# Patient Record
Sex: Female | Born: 1935 | Race: White | Hispanic: No | Smoking: Never smoker
Health system: Southern US, Community
[De-identification: ages and names within clinical notes are randomized; demographics above are authoritative.]

## PROBLEM LIST (undated history)

## (undated) DIAGNOSIS — N189 Chronic kidney disease, unspecified: Secondary | ICD-10-CM

## (undated) DIAGNOSIS — I10 Essential (primary) hypertension: Secondary | ICD-10-CM

## (undated) DIAGNOSIS — C801 Malignant (primary) neoplasm, unspecified: Secondary | ICD-10-CM

## (undated) DIAGNOSIS — G459 Transient cerebral ischemic attack, unspecified: Secondary | ICD-10-CM

## (undated) DIAGNOSIS — E213 Hyperparathyroidism, unspecified: Secondary | ICD-10-CM

## (undated) DIAGNOSIS — E119 Type 2 diabetes mellitus without complications: Secondary | ICD-10-CM

## (undated) HISTORY — PX: ABDOMINAL HYSTERECTOMY: SHX81

---

## 2004-09-21 ENCOUNTER — Emergency Department: Payer: Self-pay | Admitting: Emergency Medicine

## 2005-05-02 ENCOUNTER — Ambulatory Visit: Payer: Self-pay | Admitting: Internal Medicine

## 2005-05-19 ENCOUNTER — Ambulatory Visit: Payer: Self-pay | Admitting: Internal Medicine

## 2006-06-27 ENCOUNTER — Emergency Department: Payer: Self-pay | Admitting: Unknown Physician Specialty

## 2006-08-17 ENCOUNTER — Ambulatory Visit: Payer: Self-pay

## 2006-08-29 ENCOUNTER — Encounter: Payer: Self-pay | Admitting: General Practice

## 2006-09-08 ENCOUNTER — Encounter: Payer: Self-pay | Admitting: General Practice

## 2006-10-07 ENCOUNTER — Encounter: Payer: Self-pay | Admitting: General Practice

## 2006-11-13 ENCOUNTER — Ambulatory Visit: Payer: Self-pay | Admitting: Internal Medicine

## 2007-11-19 ENCOUNTER — Ambulatory Visit: Payer: Self-pay | Admitting: Internal Medicine

## 2010-02-04 ENCOUNTER — Ambulatory Visit: Payer: Self-pay | Admitting: Internal Medicine

## 2010-11-16 ENCOUNTER — Ambulatory Visit: Payer: Self-pay | Admitting: Unknown Physician Specialty

## 2010-11-22 LAB — PATHOLOGY REPORT

## 2011-03-08 ENCOUNTER — Ambulatory Visit: Payer: Self-pay | Admitting: Internal Medicine

## 2011-03-09 ENCOUNTER — Ambulatory Visit: Payer: Self-pay | Admitting: Internal Medicine

## 2011-04-09 ENCOUNTER — Ambulatory Visit: Payer: Self-pay | Admitting: Internal Medicine

## 2011-11-11 ENCOUNTER — Emergency Department: Payer: Self-pay | Admitting: Emergency Medicine

## 2011-11-29 ENCOUNTER — Ambulatory Visit: Payer: Self-pay | Admitting: Internal Medicine

## 2013-08-09 ENCOUNTER — Emergency Department: Payer: Self-pay | Admitting: Emergency Medicine

## 2013-08-09 LAB — COMPREHENSIVE METABOLIC PANEL
ALK PHOS: 90 U/L
ANION GAP: 6 — AB (ref 7–16)
AST: 32 U/L (ref 15–37)
Albumin: 3.7 g/dL (ref 3.4–5.0)
BUN: 19 mg/dL — ABNORMAL HIGH (ref 7–18)
Bilirubin,Total: 0.5 mg/dL (ref 0.2–1.0)
CALCIUM: 9.7 mg/dL (ref 8.5–10.1)
CO2: 26 mmol/L (ref 21–32)
CREATININE: 1.6 mg/dL — AB (ref 0.60–1.30)
Chloride: 105 mmol/L (ref 98–107)
GFR CALC AF AMER: 35 — AB
GFR CALC NON AF AMER: 31 — AB
Glucose: 224 mg/dL — ABNORMAL HIGH (ref 65–99)
OSMOLALITY: 283 (ref 275–301)
Potassium: 3.8 mmol/L (ref 3.5–5.1)
SGPT (ALT): 23 U/L (ref 12–78)
SODIUM: 137 mmol/L (ref 136–145)
Total Protein: 7.2 g/dL (ref 6.4–8.2)

## 2013-08-09 LAB — CBC
HCT: 44.4 % (ref 35.0–47.0)
HGB: 14.6 g/dL (ref 12.0–16.0)
MCH: 30.3 pg (ref 26.0–34.0)
MCHC: 32.8 g/dL (ref 32.0–36.0)
MCV: 92 fL (ref 80–100)
PLATELETS: 324 10*3/uL (ref 150–440)
RBC: 4.81 10*6/uL (ref 3.80–5.20)
RDW: 13.7 % (ref 11.5–14.5)
WBC: 19.4 10*3/uL — AB (ref 3.6–11.0)

## 2013-08-09 LAB — CK TOTAL AND CKMB (NOT AT ARMC)
CK, Total: 465 U/L — ABNORMAL HIGH (ref 21–215)
CK-MB: 8 ng/mL — AB (ref 0.5–3.6)

## 2013-08-09 LAB — TROPONIN I
Troponin-I: <0.02 ng/mL
Troponin-I: <0.02 ng/mL

## 2013-12-04 ENCOUNTER — Ambulatory Visit: Payer: Self-pay | Admitting: Internal Medicine

## 2014-01-10 ENCOUNTER — Ambulatory Visit: Payer: Self-pay | Admitting: Unknown Physician Specialty

## 2014-01-13 LAB — PATHOLOGY REPORT

## 2015-11-30 ENCOUNTER — Other Ambulatory Visit: Payer: Self-pay | Admitting: Internal Medicine

## 2015-11-30 DIAGNOSIS — Z1231 Encounter for screening mammogram for malignant neoplasm of breast: Secondary | ICD-10-CM

## 2015-12-08 ENCOUNTER — Ambulatory Visit: Payer: Medicare Other

## 2015-12-22 ENCOUNTER — Ambulatory Visit
Admission: RE | Admit: 2015-12-22 | Discharge: 2015-12-22 | Disposition: A | Discharge status: Home / Self Care | Payer: Medicare Other | Source: Ambulatory Visit | Attending: Internal Medicine | Admitting: Internal Medicine

## 2015-12-22 ENCOUNTER — Ambulatory Visit: Admission: RE | Admit: 2015-12-22 | Payer: Medicare Other | Source: Ambulatory Visit

## 2015-12-22 DIAGNOSIS — Z1231 Encounter for screening mammogram for malignant neoplasm of breast: Secondary | ICD-10-CM | POA: Insufficient documentation

## 2015-12-22 HISTORY — DX: Malignant (primary) neoplasm, unspecified: C80.1

## 2016-06-02 ENCOUNTER — Other Ambulatory Visit: Payer: Self-pay | Admitting: Internal Medicine

## 2016-06-02 DIAGNOSIS — R1310 Dysphagia, unspecified: Secondary | ICD-10-CM

## 2016-06-24 ENCOUNTER — Ambulatory Visit: Payer: Medicare Other | Attending: Internal Medicine

## 2016-07-04 ENCOUNTER — Ambulatory Visit
Admission: RE | Admit: 2016-07-04 | Discharge: 2016-07-04 | Disposition: A | Discharge status: Home / Self Care | Payer: Medicare Other | Source: Ambulatory Visit | Attending: Internal Medicine | Admitting: Internal Medicine

## 2016-07-04 DIAGNOSIS — R1310 Dysphagia, unspecified: Secondary | ICD-10-CM | POA: Diagnosis not present

## 2016-07-04 DIAGNOSIS — R1312 Dysphagia, oropharyngeal phase: Secondary | ICD-10-CM

## 2016-07-04 NOTE — Therapy (Signed)
[place], [place], [address] Phone: [phone]   Fax:     Modified Barium Swallow  Patient Details  Name: Amber Fields MRN: [id number] Date of Birth: 1933/01/25 No Data Recorded  Encounter Date: 07/04/2016      End of Session - 07/04/16 1418    Visit Number 1   Number of Visits 1   Date for SLP Re-Evaluation 07/04/16   SLP Start Time 59   SLP Stop Time  1315   SLP Time Calculation (min) 45 min   Activity Tolerance Patient tolerated treatment well      Past Medical History:  Diagnosis Date  . Cancer ([place])    skin ca    Past Surgical History:  Procedure Laterality Date  . ABDOMINAL HYSTERECTOMY      There were no vitals filed for this visit.   Subjective: Patient behavior: (alertness, ability to follow instructions, etc.): Patient is alert, able to express her swallowing history, and able to follow directions.  Chief complaint: Difficulty initiating swallowing "at times"   Objective:  Radiological Procedure: A videoflouroscopic evaluation of oral-preparatory, reflex initiation, and pharyngeal phases of the swallow was performed; as well as a screening of the upper esophageal phase.  I. POSTURE: Upright in MBS chair  II. VIEW: Lateral  III. COMPENSATORY STRATEGIES: N/A  IV. BOLUSES ADMINISTERED:   Thin Liquid: 2 small, 3 rapid consecutive   Nectar-thick Liquid: 1 moderate size    Puree: 2 teaspoon presentations   Mechanical Soft: 1/4 graham cracker in applesauce  V. RESULTS OF EVALUATION: A. ORAL PREPARATORY PHASE: (The lips, tongue, and velum are observed for strength and coordination)       **Overall Severity Rating: Within normal limits  B. SWALLOW INITIATION/REFLEX: (The reflex is normal if "triggered" by the time the bolus reached the base of the tongue)  **Overall Severity Rating: Within normal limits  C. PHARYNGEAL PHASE: (Pharyngeal function is  normal if the bolus shows rapid, smooth, and continuous transit through the pharynx and there is no pharyngeal residue after the swallow)  **Overall Severity Rating: Within functional limits  D. LARYNGEAL PENETRATION: (Material entering into the laryngeal inlet/vestibule but not aspirated) Flash laryngeal penetration with thin liquids  E. ASPIRATION: None  F. ESOPHAGEAL PHASE: (Screening of the upper esophagus): No abnormalities within the viewable cervical esophagus  ASSESSMENT: This 82 year old woman, with inconsistent difficulty initiating swallowing, is presenting with minimal oropharyngeal dyspahgia.  Oral control of the bolus including oral hold, rotary mastication, and anterior to posterior transfer are within normal limits. Timing of the pharyngeal swallow is within normal limits.  Aspects of the pharyngeal stage of swallowing including tongue base retraction, hyolaryngeal excursion, epiglottic inversion, duration/amplitude of UES opening, and laryngeal vestibule closure at the height of the swallow are within normal limits.  There is no vallecular residue.  There was transient laryngeal penetration (within normal limits for patient's age) and no observed aspiration.  It does not appear that her subjective complaint of difficulty initiating swallowing is clinically significant and the patient is not at risk for prandial aspiration.    PLAN/RECOMMENDATIONS:   A. Diet: Regular   B. Swallowing Precautions: Standard; if difficulty initiating swallowing occurs, just relax for a few minutes   C. Recommended consultation to N/A   D. Therapy recommendations N/A   E. Results and recommendations were discussed with the patient immediately following the study and the final report routed to the referring MD.  Oropharyngeal dysphagia - Plan: DG OP Swallowing Func-Medicare/Speech Path, DG OP Swallowing Func-Medicare/Speech Path      G-Codes - 2016/07/15 1420    Functional Assessment Tool  Used MBS, clinical judgment   Functional Limitations Swallowing   Swallow Current Status (Y7092) At least 1 percent but less than 20 percent impaired, limited or restricted   Swallow Goal Status (H5747) At least 1 percent but less than 20 percent impaired, limited or restricted   Swallow Discharge Status [phone]) At least 1 percent but less than 20 percent impaired, limited or restricted          Problem List There are no active problems to display for this patient.   Lou Miner 07/15/2016, 2:21 PM  [facility] [place], [place], [address] Phone: [phone]   Fax:     Name: Amber Fields MRN: [id number] Date of Birth: 12-01-1932

## 2017-11-10 IMAGING — RF DG SWALLOWING FUNCTION
7 series · 13 of 24 positions shown · non-contrast
Comparison: No recent prior.

EXAM:
MODIFIED BARIUM SWALLOW
TECHNIQUE: Different consistencies of barium were administered orally to the
patient by the Speech Pathologist. Imaging of the pharynx was
performed in the lateral projection.

FLUOROSCOPY TIME:  Fluoroscopy Time:  1 minutes 6 seconds.
Radiation Exposure Index (if provided by the fluoroscopic device):
5.0 mGy
Number of Acquired Spot Images: Cine images obtained .

[Series 3: run · 2 of 265 frames shown (1 of 7)]
[frame 40/265]
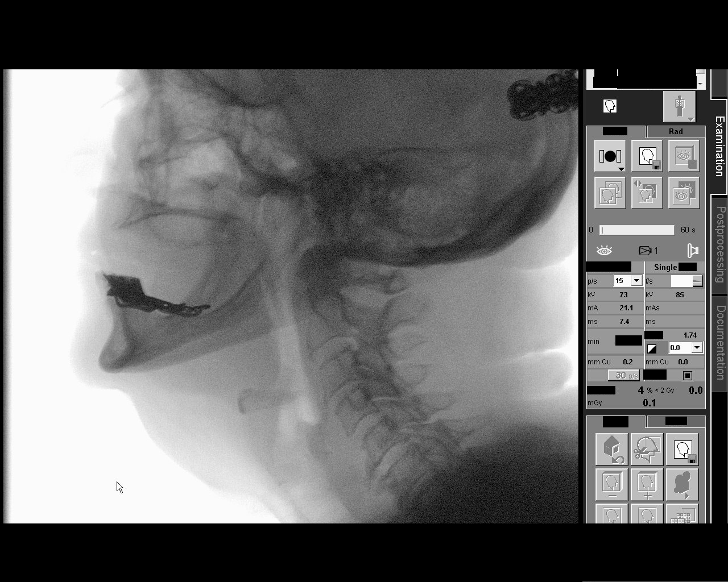
[frame 151/265]
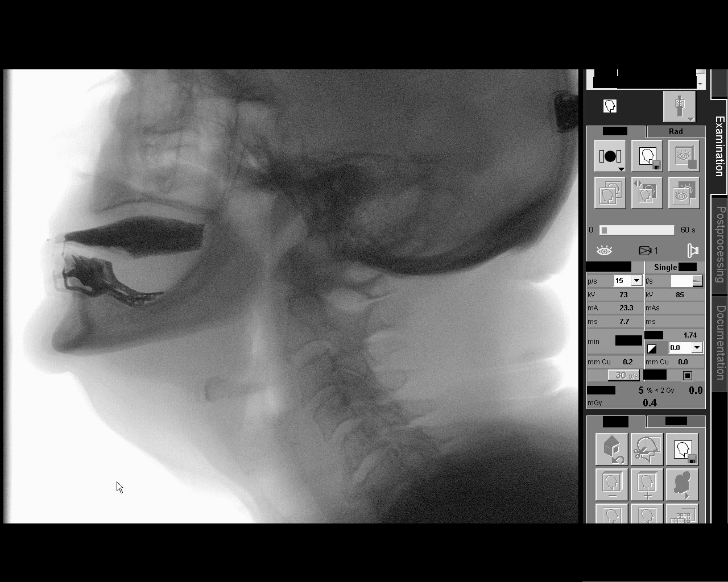

[Series 4: run · 2 of 171 frames shown (2 of 7)]
[frame 53/171]
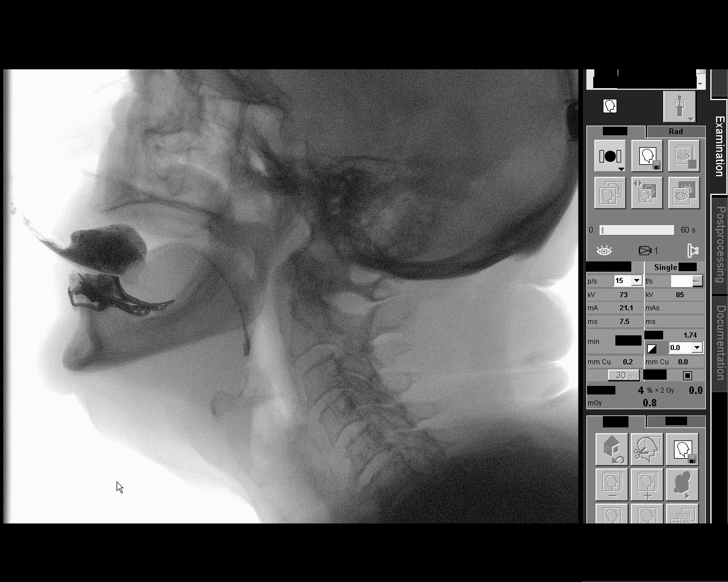
[frame 146/171]
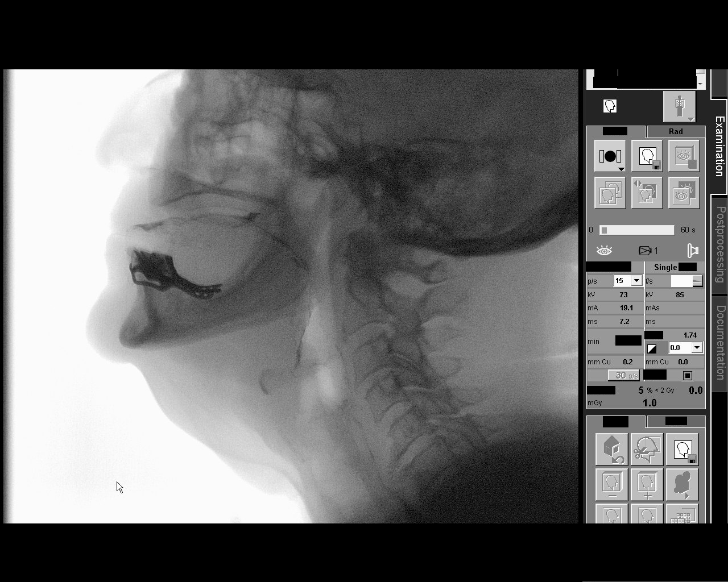

[Series 5: run · 1 of 402 frames shown (3 of 7)]
[frame 137/402]
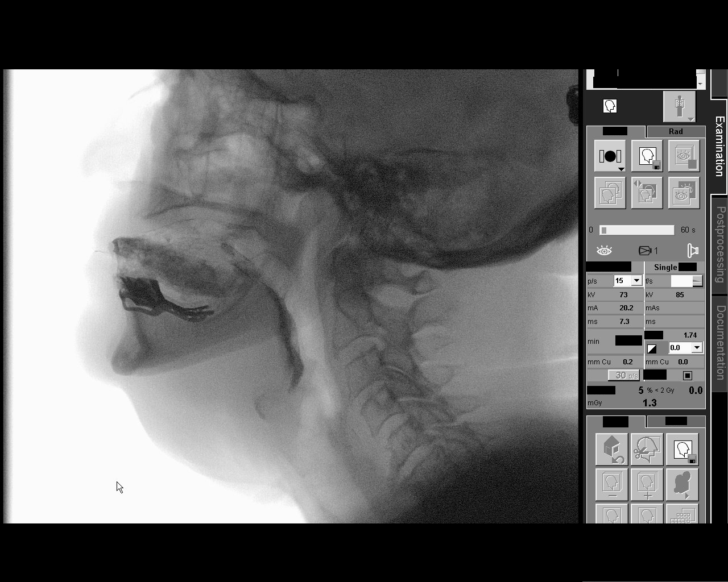

[Series 6: run · 3 of 296 frames shown (4 of 7)]
[frame 45/296]
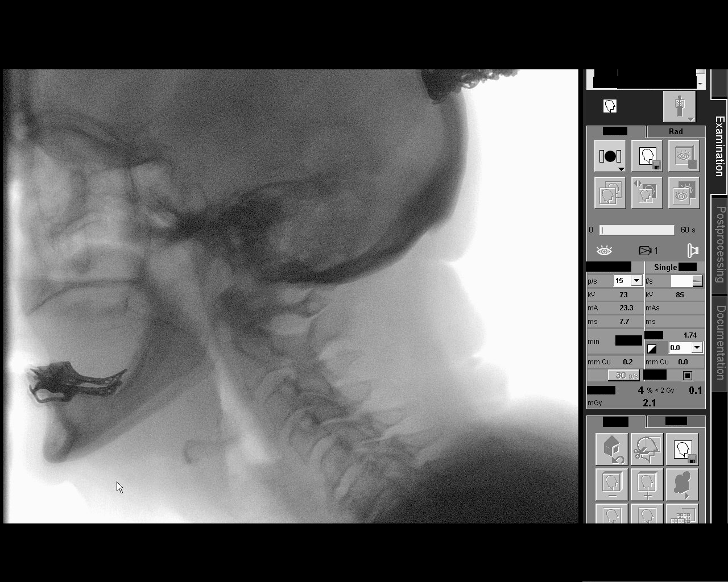
[frame 252/296]
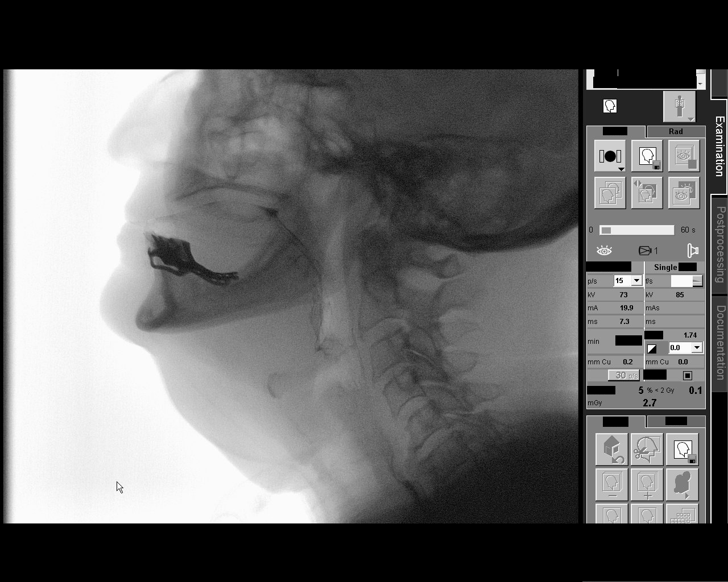
[frame 254/296]
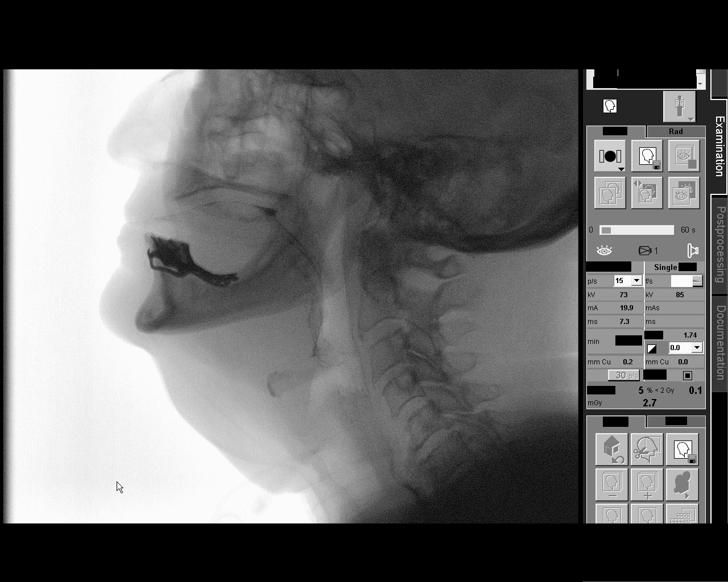

[Series 7: run · 1 of 166 frames shown (5 of 7)]
[frame 142/166]
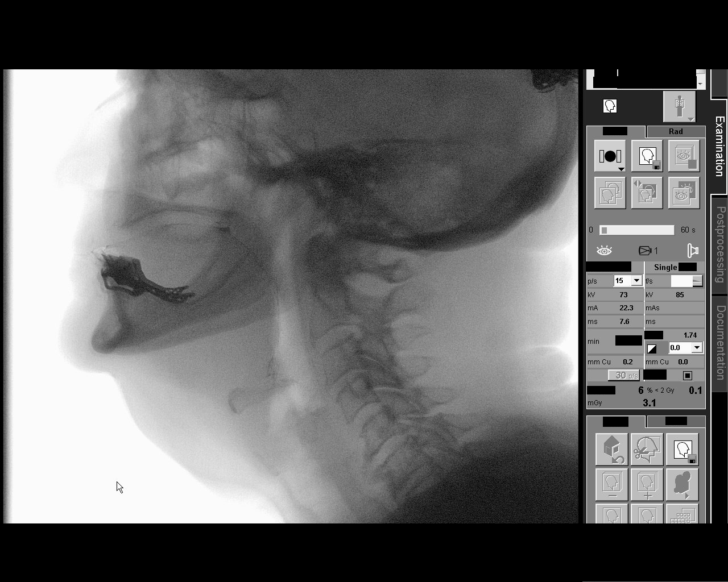

[Series 8: run · 2 of 130 frames shown (6 of 7)]
[frame 20/130]
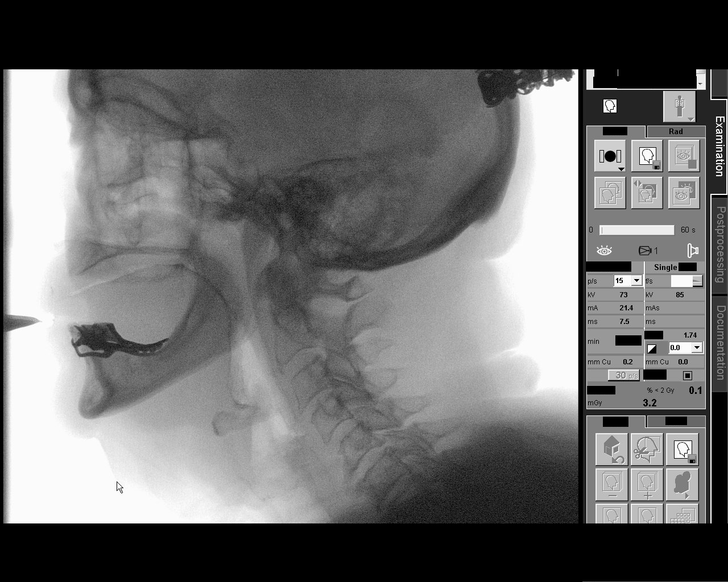
[frame 91/130]
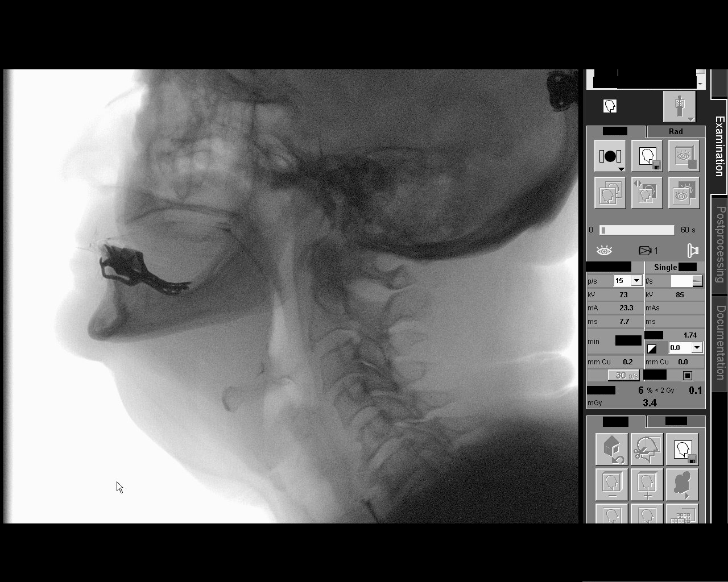

[Series 9: run · 2 of 538 frames shown (7 of 7)]
[frame 270/538]
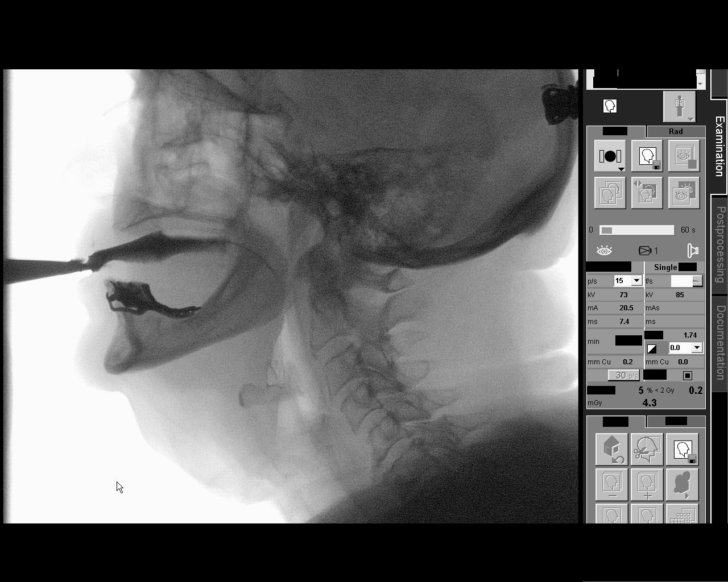
[frame 458/538]
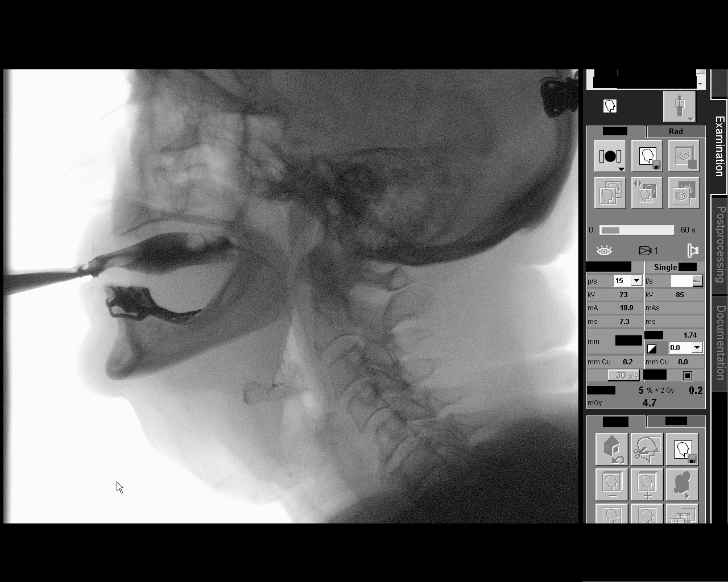

[13 of 24 positions shown; findings below may reference images not displayed]

FINDINGS: Thin liquid- within normal limits

Nectar thick liquid- within normal limits

[place]?Karimkhan within normal limits

[place]?Yenque with cracker- within normal limits
IMPRESSION: Normal exam.

Please refer to the Speech Pathologists report for complete details
and recommendations.

## 2017-12-04 ENCOUNTER — Other Ambulatory Visit: Payer: Self-pay | Admitting: Internal Medicine

## 2017-12-04 DIAGNOSIS — Z1239 Encounter for other screening for malignant neoplasm of breast: Secondary | ICD-10-CM

## 2017-12-11 ENCOUNTER — Ambulatory Visit
Admission: RE | Admit: 2017-12-11 | Discharge: 2017-12-11 | Disposition: A | Discharge status: Home / Self Care | Payer: Medicare Other | Source: Ambulatory Visit | Attending: Internal Medicine | Admitting: Internal Medicine

## 2017-12-11 DIAGNOSIS — Z1231 Encounter for screening mammogram for malignant neoplasm of breast: Secondary | ICD-10-CM | POA: Insufficient documentation

## 2017-12-11 DIAGNOSIS — Z1239 Encounter for other screening for malignant neoplasm of breast: Secondary | ICD-10-CM

## 2019-12-24 ENCOUNTER — Other Ambulatory Visit: Payer: Self-pay | Admitting: Internal Medicine

## 2019-12-24 DIAGNOSIS — Z1231 Encounter for screening mammogram for malignant neoplasm of breast: Secondary | ICD-10-CM

## 2020-01-07 ENCOUNTER — Encounter (INDEPENDENT_AMBULATORY_CARE_PROVIDER_SITE_OTHER): Payer: Self-pay

## 2020-01-07 ENCOUNTER — Other Ambulatory Visit: Payer: Self-pay

## 2020-01-07 ENCOUNTER — Ambulatory Visit
Admission: RE | Admit: 2020-01-07 | Discharge: 2020-01-07 | Disposition: A | Discharge status: Home / Self Care | Payer: Medicare Other | Source: Ambulatory Visit | Attending: Internal Medicine | Admitting: Internal Medicine

## 2020-01-07 DIAGNOSIS — Z1231 Encounter for screening mammogram for malignant neoplasm of breast: Secondary | ICD-10-CM

## 2021-01-15 ENCOUNTER — Other Ambulatory Visit: Payer: Self-pay | Admitting: Nephrology

## 2021-01-15 ENCOUNTER — Other Ambulatory Visit (HOSPITAL_COMMUNITY): Payer: Self-pay | Admitting: Nephrology

## 2021-01-15 DIAGNOSIS — N184 Chronic kidney disease, stage 4 (severe): Secondary | ICD-10-CM

## 2021-02-10 ENCOUNTER — Inpatient Hospital Stay
Admission: EM | Admit: 2021-02-10 | Discharge: 2021-02-12 | DRG: 177 | Disposition: A | Discharge status: Home Health | Payer: Medicare Other | Attending: Student | Admitting: Student

## 2021-02-10 ENCOUNTER — Other Ambulatory Visit: Payer: Self-pay

## 2021-02-10 ENCOUNTER — Emergency Department: Payer: Medicare Other

## 2021-02-10 ENCOUNTER — Inpatient Hospital Stay: Payer: Medicare Other

## 2021-02-10 DIAGNOSIS — R059 Cough, unspecified: Secondary | ICD-10-CM | POA: Diagnosis present

## 2021-02-10 DIAGNOSIS — Z8673 Personal history of transient ischemic attack (TIA), and cerebral infarction without residual deficits: Secondary | ICD-10-CM | POA: Diagnosis not present

## 2021-02-10 DIAGNOSIS — Z885 Allergy status to narcotic agent status: Secondary | ICD-10-CM | POA: Diagnosis not present

## 2021-02-10 DIAGNOSIS — W19XXXA Unspecified fall, initial encounter: Secondary | ICD-10-CM

## 2021-02-10 DIAGNOSIS — Z7901 Long term (current) use of anticoagulants: Secondary | ICD-10-CM | POA: Diagnosis not present

## 2021-02-10 DIAGNOSIS — Z880 Allergy status to penicillin: Secondary | ICD-10-CM

## 2021-02-10 DIAGNOSIS — E1122 Type 2 diabetes mellitus with diabetic chronic kidney disease: Secondary | ICD-10-CM | POA: Diagnosis present

## 2021-02-10 DIAGNOSIS — Z888 Allergy status to other drugs, medicaments and biological substances status: Secondary | ICD-10-CM

## 2021-02-10 DIAGNOSIS — E042 Nontoxic multinodular goiter: Secondary | ICD-10-CM | POA: Diagnosis present

## 2021-02-10 DIAGNOSIS — Y92009 Unspecified place in unspecified non-institutional (private) residence as the place of occurrence of the external cause: Secondary | ICD-10-CM

## 2021-02-10 DIAGNOSIS — Z7982 Long term (current) use of aspirin: Secondary | ICD-10-CM

## 2021-02-10 DIAGNOSIS — U071 COVID-19: Principal | ICD-10-CM | POA: Diagnosis present

## 2021-02-10 DIAGNOSIS — J1282 Pneumonia due to coronavirus disease 2019: Secondary | ICD-10-CM | POA: Diagnosis present

## 2021-02-10 DIAGNOSIS — Z803 Family history of malignant neoplasm of breast: Secondary | ICD-10-CM | POA: Diagnosis not present

## 2021-02-10 DIAGNOSIS — E041 Nontoxic single thyroid nodule: Secondary | ICD-10-CM

## 2021-02-10 DIAGNOSIS — I129 Hypertensive chronic kidney disease with stage 1 through stage 4 chronic kidney disease, or unspecified chronic kidney disease: Secondary | ICD-10-CM | POA: Diagnosis present

## 2021-02-10 DIAGNOSIS — N184 Chronic kidney disease, stage 4 (severe): Secondary | ICD-10-CM | POA: Diagnosis present

## 2021-02-10 DIAGNOSIS — Z886 Allergy status to analgesic agent status: Secondary | ICD-10-CM | POA: Diagnosis not present

## 2021-02-10 DIAGNOSIS — Z882 Allergy status to sulfonamides status: Secondary | ICD-10-CM

## 2021-02-10 DIAGNOSIS — R531 Weakness: Secondary | ICD-10-CM | POA: Diagnosis not present

## 2021-02-10 DIAGNOSIS — R296 Repeated falls: Secondary | ICD-10-CM

## 2021-02-10 DIAGNOSIS — Z79899 Other long term (current) drug therapy: Secondary | ICD-10-CM

## 2021-02-10 LAB — COMPREHENSIVE METABOLIC PANEL
ALT: 15 U/L (ref 0–44)
AST: 27 U/L (ref 15–41)
Albumin: 3.1 g/dL — ABNORMAL LOW (ref 3.5–5.0)
Alkaline Phosphatase: 60 U/L (ref 38–126)
Anion gap: 6 (ref 5–15)
BUN: 36 mg/dL — ABNORMAL HIGH (ref 8–23)
CO2: 22 mmol/L (ref 22–32)
Calcium: 9.1 mg/dL (ref 8.9–10.3)
Chloride: 109 mmol/L (ref 98–111)
Creatinine, Ser: 1.71 mg/dL — ABNORMAL HIGH (ref 0.44–1.00)
GFR, Estimated: 29 mL/min — ABNORMAL LOW (ref >60)
Glucose, Bld: 127 mg/dL — ABNORMAL HIGH (ref 70–99)
Potassium: 4.7 mmol/L (ref 3.5–5.1)
Sodium: 137 mmol/L (ref 135–145)
Total Bilirubin: 0.5 mg/dL (ref 0.3–1.2)
Total Protein: 6.1 g/dL — ABNORMAL LOW (ref 6.5–8.1)

## 2021-02-10 LAB — URINALYSIS, COMPLETE (UACMP) WITH MICROSCOPIC
Bilirubin Urine: NEGATIVE
Glucose, UA: NEGATIVE mg/dL
Ketones, ur: NEGATIVE mg/dL
Leukocytes,Ua: NEGATIVE
Nitrite: NEGATIVE
Protein, ur: NEGATIVE mg/dL
Specific Gravity, Urine: 1.006 (ref 1.005–1.030)
Squamous Epithelial / HPF: NONE SEEN (ref 0–5)
pH: 5 (ref 5.0–8.0)

## 2021-02-10 LAB — TSH: TSH: 1.147 u[IU]/mL (ref 0.350–4.500)

## 2021-02-10 LAB — BRAIN NATRIURETIC PEPTIDE: B Natriuretic Peptide: 583.2 pg/mL — ABNORMAL HIGH (ref 0.0–100.0)

## 2021-02-10 LAB — CBC WITH DIFFERENTIAL/PLATELET
Abs Immature Granulocytes: 0.06 10*3/uL (ref 0.00–0.07)
Basophils Absolute: 0 10*3/uL (ref 0.0–0.1)
Basophils Relative: 0 %
Eosinophils Absolute: 0 10*3/uL (ref 0.0–0.5)
Eosinophils Relative: 0 %
HCT: 37.2 % (ref 36.0–46.0)
Hemoglobin: 11.9 g/dL — ABNORMAL LOW (ref 12.0–15.0)
Immature Granulocytes: 1 %
Lymphocytes Relative: 12 %
Lymphs Abs: 1.3 10*3/uL (ref 0.7–4.0)
MCH: 30.3 pg (ref 26.0–34.0)
MCHC: 32 g/dL (ref 30.0–36.0)
MCV: 94.7 fL (ref 80.0–100.0)
Monocytes Absolute: 0.9 10*3/uL (ref 0.1–1.0)
Monocytes Relative: 9 %
Neutro Abs: 8.4 10*3/uL — ABNORMAL HIGH (ref 1.7–7.7)
Neutrophils Relative %: 78 %
Platelets: 207 10*3/uL (ref 150–400)
RBC: 3.93 MIL/uL (ref 3.87–5.11)
RDW: 14.5 % (ref 11.5–15.5)
WBC: 10.7 10*3/uL — ABNORMAL HIGH (ref 4.0–10.5)
nRBC: 0 % (ref 0.0–0.2)

## 2021-02-10 LAB — PROCALCITONIN: Procalcitonin: <0.1 ng/mL

## 2021-02-10 LAB — RESP PANEL BY RT-PCR (FLU A&B, COVID) ARPGX2
Influenza A by PCR: NEGATIVE
Influenza B by PCR: NEGATIVE
SARS Coronavirus 2 by RT PCR: POSITIVE — AB

## 2021-02-10 LAB — TROPONIN I (HIGH SENSITIVITY)
Troponin I (High Sensitivity): 48 ng/L — ABNORMAL HIGH (ref <18)
Troponin I (High Sensitivity): 43 ng/L — ABNORMAL HIGH (ref <18)

## 2021-02-10 LAB — CK: Total CK: 544 U/L — ABNORMAL HIGH (ref 38–234)

## 2021-02-10 LAB — MAGNESIUM: Magnesium: 2 mg/dL (ref 1.7–2.4)

## 2021-02-10 LAB — LACTIC ACID, PLASMA: Lactic Acid, Venous: 0.9 mmol/L (ref 0.5–1.9)

## 2021-02-10 MED ORDER — SODIUM CHLORIDE 0.9 % IV SOLN: 100.0000 mg | Freq: Every day | INTRAVENOUS | Status: DC

## 2021-02-10 MED ORDER — GUAIFENESIN-DM 100-10 MG/5ML PO SYRP: 10.0000 mL | ORAL_SOLUTION | ORAL | Status: DC | PRN

## 2021-02-10 MED ORDER — SODIUM CHLORIDE 0.9 % IV SOLN: 200.0000 mg | Freq: Once | INTRAVENOUS | Status: DC

## 2021-02-10 MED ORDER — SODIUM CHLORIDE 0.9 % IV SOLN: 250.0000 mL | INTRAVENOUS | Status: DC | PRN

## 2021-02-10 MED ORDER — FUROSEMIDE 10 MG/ML IJ SOLN
20.0000 mg | Freq: Once | INTRAMUSCULAR | Status: AC
  Administered 2021-02-10: 20 mg via INTRAVENOUS
  Filled 2021-02-10: qty 4

## 2021-02-10 MED ORDER — ZINC SULFATE 220 (50 ZN) MG PO CAPS
220.0000 mg | ORAL_CAPSULE | Freq: Every day | ORAL | Status: DC
  Administered 2021-02-10 – 2021-02-12 (×3): 220 mg via ORAL
  Filled 2021-02-10 (×3): qty 1

## 2021-02-10 MED ORDER — SODIUM CHLORIDE 0.9% FLUSH
3.0000 mL | Freq: Two times a day (BID) | INTRAVENOUS | Status: DC
  Administered 2021-02-10 – 2021-02-12 (×5): 3 mL via INTRAVENOUS

## 2021-02-10 MED ORDER — SODIUM CHLORIDE 0.9 % IV SOLN
100.0000 mg | INTRAVENOUS | Status: AC
  Administered 2021-02-10 (×2): 100 mg via INTRAVENOUS
  Filled 2021-02-10 (×2): qty 20

## 2021-02-10 MED ORDER — ALBUTEROL SULFATE HFA 108 (90 BASE) MCG/ACT IN AERS
2.0000 | INHALATION_SPRAY | Freq: Four times a day (QID) | RESPIRATORY_TRACT | Status: DC
  Administered 2021-02-10 – 2021-02-12 (×9): 2 via RESPIRATORY_TRACT
  Filled 2021-02-10 (×2): qty 6.7

## 2021-02-10 MED ORDER — SODIUM CHLORIDE 0.9% FLUSH: 3.0000 mL | INTRAVENOUS | Status: DC | PRN

## 2021-02-10 MED ORDER — LACTATED RINGERS IV BOLUS
1000.0000 mL | Freq: Once | INTRAVENOUS | Status: AC
  Administered 2021-02-10: 1000 mL via INTRAVENOUS

## 2021-02-10 MED ORDER — SODIUM CHLORIDE 0.9 % IV SOLN
100.0000 mg | Freq: Every day | INTRAVENOUS | Status: DC
  Administered 2021-02-11 – 2021-02-12 (×2): 100 mg via INTRAVENOUS
  Filled 2021-02-10: qty 20
  Filled 2021-02-10 (×2): qty 100

## 2021-02-10 MED ORDER — ENOXAPARIN SODIUM 30 MG/0.3ML IJ SOSY
30.0000 mg | PREFILLED_SYRINGE | INTRAMUSCULAR | Status: DC
  Administered 2021-02-10 – 2021-02-11 (×2): 30 mg via SUBCUTANEOUS
  Filled 2021-02-10 (×2): qty 0.3

## 2021-02-10 MED ORDER — ASCORBIC ACID 500 MG PO TABS
500.0000 mg | ORAL_TABLET | Freq: Every day | ORAL | Status: DC
  Administered 2021-02-10 – 2021-02-12 (×3): 500 mg via ORAL
  Filled 2021-02-10 (×3): qty 1

## 2021-02-10 MED ORDER — SODIUM CHLORIDE 0.9 % IV SOLN
200.0000 mg | Freq: Once | INTRAVENOUS | Status: DC
  Filled 2021-02-10: qty 40

## 2021-02-10 NOTE — ED Notes (Signed)
External urinary catheter replaced, not present on pt. Pt states "I used the restroom all over myself and someone cleaned me up". Will continue to monitor dedicated suction canister for collection of sample for urinalysis.

## 2021-02-10 NOTE — ED Provider Notes (Addendum)
[facility] Emergency Department Provider Note  ____________________________________________   Event Date/Time   First MD Initiated Contact with Patient 02/10/21 [phone]     (approximate)  I have reviewed the triage vital signs and the nursing notes.   HISTORY  Chief Complaint Weakness   HPI Amber Fields is a 87 y.o. female with past medical history of diabetes mellitus type 2 with chronic kidney disease, gout, hyperlipidemia, hypertension, TIA, vitamin D deficiency who presents via EMS from home for assessment approximately 3 days of "feeling off" and generally weak with a couple of falls.  Patient states she slid off her bed couple times all these times falling onto her bottom.  Patient states she is also had a cough but has not had any chest pain, shortness of breath, abdominal pain, back pain including in her lower back where she states he slid onto is adamant she has not had any headache neck pain did not hit her head or actually consciousness.  She has no rash or extremity pain or focal weakness.  She denies any urinary symptoms, vomiting, diarrhea, nausea or any other sick symptoms as far she can tell.  She does live at home alone.  No other acute concerns at this time.  Per EMS patient did have reported temperature of 100.3 although patient afebrile on arrival and reportedly patient had an SPO2 of 91% on room air and was transported on 2 L was 97% on arrival on room air per RN.         Past Medical History:  Diagnosis Date   Cancer ([place])    skin ca    There are no problems to display for this patient.   Past Surgical History:  Procedure Laterality Date   ABDOMINAL HYSTERECTOMY      Prior to Admission medications   Not on File    Allergies Pravastatin, Codeine, Ibuprofen, Penicillins, Sulfa antibiotics, and Lansoprazole  Family History  Problem Relation Age of Onset   Breast cancer Cousin        mat cousin    Social History     Review of Systems  Review of Systems  Constitutional:  Positive for malaise/fatigue. Negative for chills and fever.  HENT:  Negative for sore throat.   Eyes:  Negative for pain.  Respiratory:  Positive for cough. Negative for stridor.   Cardiovascular:  Negative for chest pain.  Gastrointestinal:  Negative for vomiting.  Genitourinary:  Negative for dysuria.  Musculoskeletal:  Positive for falls. Negative for myalgias.  Skin:  Negative for rash.  Neurological:  Positive for weakness. Negative for seizures, loss of consciousness and headaches.  Psychiatric/Behavioral:  Negative for suicidal ideas.   All other systems reviewed and are negative.    ____________________________________________   PHYSICAL EXAM:  VITAL SIGNS: ED Triage Vitals  Enc Vitals Group     BP 02/10/21 0733 (!) 135/54     Pulse Rate 02/10/21 0733 63     Resp 02/10/21 0733 18     Temp 02/10/21 0733 99.7 F (37.6 C)     Temp Source 02/10/21 0733 Oral     SpO2 02/10/21 0733 97 %     Weight 02/10/21 0730 200 lb (90.7 kg)     Height 02/10/21 0730 5\' 8"  (1.727 m)     Head Circumference --      Peak Flow --      Pain Score 02/10/21 0730 0     Pain Loc --  Pain Edu? --      Excl. in [place]? --    Vitals:   02/10/21 1100 02/10/21 1130  BP: 123/74 (!) 142/58  Pulse: (!) 50 (!) 50  Resp: 12 10  Temp:    SpO2: 96% 99%   Physical Exam Vitals and nursing note reviewed.  Constitutional:      General: She is not in acute distress.    Appearance: She is well-developed. She is obese.  HENT:     Head: Normocephalic and atraumatic.     Right Ear: External ear normal.     Left Ear: External ear normal.     Nose: Nose normal.     Mouth/Throat:     Mouth: Mucous membranes are moist.  Eyes:     Conjunctiva/sclera: Conjunctivae normal.  Cardiovascular:     Rate and Rhythm: Normal rate and regular rhythm.     Heart sounds: No murmur heard. Pulmonary:     Effort: Pulmonary effort is normal. No respiratory  distress.     Breath sounds: Normal breath sounds.  Abdominal:     Palpations: Abdomen is soft.     Tenderness: There is no abdominal tenderness.  Musculoskeletal:     Cervical back: Neck supple.  Skin:    General: Skin is warm and dry.     Capillary Refill: Capillary refill takes 2 to 3 seconds.  Neurological:     Mental Status: She is alert and oriented to person, place, and time.  Psychiatric:        Mood and Affect: Mood normal.    No tenderness step-offs or deformities over the C/T/L-spine.  Cranial nerves II through XII grossly intact.  Patient has symmetric strength in bilateral upper and lower extremities.  Sensation is intact to light touch lower extremities.  No significant limitation range of motion no pain on range of motion of the extremities.  2+ radial and DP pulses. ____________________________________________   LABS (all labs ordered are listed, but only abnormal results are displayed)  Labs Reviewed  RESP PANEL BY RT-PCR (FLU A&B, COVID) ARPGX2 - Abnormal; Notable for the following components:      Result Value   SARS Coronavirus 2 by RT PCR POSITIVE (*)    All other components within normal limits  CBC WITH DIFFERENTIAL/PLATELET - Abnormal; Notable for the following components:   WBC 10.7 (*)    Hemoglobin 11.9 (*)    Neutro Abs 8.4 (*)    All other components within normal limits  BRAIN NATRIURETIC PEPTIDE - Abnormal; Notable for the following components:   B Natriuretic Peptide 583.2 (*)    All other components within normal limits  COMPREHENSIVE METABOLIC PANEL - Abnormal; Notable for the following components:   Glucose, Bld 127 (*)    BUN 36 (*)    Creatinine, Ser 1.71 (*)    Total Protein 6.1 (*)    Albumin 3.1 (*)    GFR, Estimated 29 (*)    All other components within normal limits  TROPONIN I (HIGH SENSITIVITY) - Abnormal; Notable for the following components:   Troponin I (High Sensitivity) 48 (*)    All other components within normal limits   LACTIC ACID, PLASMA  MAGNESIUM  TSH  URINALYSIS, COMPLETE (UACMP) WITH MICROSCOPIC  TROPONIN I (HIGH SENSITIVITY)   ____________________________________________  EKG  Sinus rhythm with a ventricular of 64, normal axis, unremarkable intervals without evidence of acute ischemia or significant underlying arrhythmia. ____________________________________________  RADIOLOGY  ED MD interpretation: Chest x-ray remarkable for stable  cardiomegaly and some low lung volumes and pneumonitis versus very mild edema.  No pneumothorax, large edema or clear focal consolidation.  No nondisplaced fracture or dislocation pelvis bilateral femurs on plain, pelvis.  No significant injury noted on CT head or C-spine including evidence of intracranial hemorrhage, cervical or skull fracture or other clear acute intracranial process.  Official radiology report(s): CT Head Wo Contrast  Result Date: 02/10/2021 CLINICAL DATA:  Falls.  Weakness EXAM: CT HEAD WITHOUT CONTRAST CT CERVICAL SPINE WITHOUT CONTRAST TECHNIQUE: Multidetector CT imaging of the head and cervical spine was performed following the standard protocol without intravenous contrast. Multiplanar CT image reconstructions of the cervical spine were also generated. COMPARISON:  08/09/2013. FINDINGS: CT HEAD FINDINGS Brain: No evidence of acute large vascular territory infarction, hemorrhage, hydrocephalus, extra-axial collection or mass lesion/mass effect. Mild patchy white matter hypoattenuation, nonspecific but most likely related to chronic microvascular ischemic disease. Mild atrophy with ex vacuo ventricular dilation. Partially empty sella. Vascular: Calcific intracranial atherosclerosis. Skull: No acute fracture. Sinuses/Orbits: Mild pansinus mucosal thickening with small air-fluid levels in the left sphenoid sinus and left maxillary sinus. Other: No mastoid effusions. CT CERVICAL SPINE FINDINGS Alignment: Similar alignment. Similar straightening with mild  anterolisthesis of C4 on C5. Skull base and vertebrae: No evidence of acute fracture. Vertebral body heights are maintained. Soft tissues and spinal canal: No prevertebral fluid or swelling. No visible canal hematoma. Disc levels: Multilevel degenerative disc disease, greatest at C5-C6 and C6-C7 with disc height loss and endplate spurring. Multilevel facet and uncovertebral hypertrophy with varying degrees of neural foraminal stenosis, greatest on the left at C3-C4. Upper chest: Not visualized. Other: Calcific atherosclerosis bilateral carotid arteries. IMPRESSION: CT head: 1. No evidence of acute intracranial abnormality. 2. Mild chronic microvascular ischemic disease and atrophy. 3. Mild paranasal sinus disease, as described above. CT cervical spine: 1. No evidence of acute fracture or traumatic malalignment. 2. Moderate multilevel degenerative change, as detailed above. Electronically Signed   By: Margaretha Sheffield MD   On: 02/10/2021 09:29   CT Cervical Spine Wo Contrast  Result Date: 02/10/2021 CLINICAL DATA:  Falls.  Weakness EXAM: CT HEAD WITHOUT CONTRAST CT CERVICAL SPINE WITHOUT CONTRAST TECHNIQUE: Multidetector CT imaging of the head and cervical spine was performed following the standard protocol without intravenous contrast. Multiplanar CT image reconstructions of the cervical spine were also generated. COMPARISON:  08/09/2013. FINDINGS: CT HEAD FINDINGS Brain: No evidence of acute large vascular territory infarction, hemorrhage, hydrocephalus, extra-axial collection or mass lesion/mass effect. Mild patchy white matter hypoattenuation, nonspecific but most likely related to chronic microvascular ischemic disease. Mild atrophy with ex vacuo ventricular dilation. Partially empty sella. Vascular: Calcific intracranial atherosclerosis. Skull: No acute fracture. Sinuses/Orbits: Mild pansinus mucosal thickening with small air-fluid levels in the left sphenoid sinus and left maxillary sinus. Other: No  mastoid effusions. CT CERVICAL SPINE FINDINGS Alignment: Similar alignment. Similar straightening with mild anterolisthesis of C4 on C5. Skull base and vertebrae: No evidence of acute fracture. Vertebral body heights are maintained. Soft tissues and spinal canal: No prevertebral fluid or swelling. No visible canal hematoma. Disc levels: Multilevel degenerative disc disease, greatest at C5-C6 and C6-C7 with disc height loss and endplate spurring. Multilevel facet and uncovertebral hypertrophy with varying degrees of neural foraminal stenosis, greatest on the left at C3-C4. Upper chest: Not visualized. Other: Calcific atherosclerosis bilateral carotid arteries. IMPRESSION: CT head: 1. No evidence of acute intracranial abnormality. 2. Mild chronic microvascular ischemic disease and atrophy. 3. Mild paranasal sinus disease, as described above. CT  cervical spine: 1. No evidence of acute fracture or traumatic malalignment. 2. Moderate multilevel degenerative change, as detailed above. Electronically Signed   By: Margaretha Sheffield MD   On: 02/10/2021 09:29   DG Pelvis Portable  Result Date: 02/10/2021 CLINICAL DATA:  Fall EXAM: PORTABLE PELVIS 1-2 VIEWS COMPARISON:  None. FINDINGS: There is no evidence of displaced pelvic fracture or diastasis. No pelvic bone lesions are seen. IMPRESSION: No displaced fracture or dislocation of the pelvis or bilateral proximal femurs in single frontal view. Please note that plain radiographs are significantly insensitive for hip and pelvic fracture; recommend CT or MRI to most sensitively evaluate for fracture if suspected. Electronically Signed   By: Eddie Candle M.D.   On: 02/10/2021 08:22   DG Chest Portable 1 View  Result Date: 02/10/2021 CLINICAL DATA:  Cough.  Weakness. EXAM: PORTABLE CHEST 1 VIEW COMPARISON:  08/09/2013. FINDINGS: Cardiomegaly. Lung volumes. Mild bilateral interstitial prominence. Mild interstitial edema and/or pneumonitis cannot be completely excluded. Tiny  left pleural effusion cannot be excluded. No pneumothorax. IMPRESSION: Cardiomegaly. Low lung volumes. Mild bilateral interstitial prominence. Mild interstitial edema and/or pneumonitis cannot be excluded. Tiny left pleural effusion cannot be excluded. Electronically Signed   By: Marcello Moores  Register   On: 02/10/2021 08:22    ____________________________________________   PROCEDURES  Procedure(s) performed (including Critical Care):  .1-3 Lead EKG Interpretation  Date/Time: 02/10/2021 11:44 AM Performed by: Lucrezia Starch, MD Authorized by: Lucrezia Starch, MD     Interpretation: normal     ECG rate assessment: normal     Rhythm: sinus rhythm     Ectopy: none     Conduction: normal     ____________________________________________   INITIAL IMPRESSION / ASSESSMENT AND PLAN / ED COURSE     Patient presents with above to history exam for assessment of some weakness and "feeling off" as well as a nonproductive cough and a couple falls over the last 3 days that she describes as sliding out of her bed on her bottom..  Patient is afebrile and hemodynamically stable on arrival.  She has a nonfocal exam and denies any clear focal complaints and has no other signs of significant injury.  Initial differential is quite broad and includes possible acute infectious process, metabolic derangements, arrhythmia, thyroid derangements, heart failure and possible traumatic injuries from ground-level falls.  Patient has no obvious findings of trauma on exam given age and recurrent falls he did obtain a CT head and C-spine as well as a chest and pelvis x-ray.  CT head and C-spine no evidence of acute injury or other clear acute intracranial process.  Chest x-ray evidence of rib fracture, pneumothorax, clear focal consolidation or overt edema or other clear acute thoracic process.  Pelvic x-ray is unremarkable.  CBC shows WBC count of 10.7 with a hemoglobin of 11.9.  Suspicion for acute symptomatic anemia.   Lactic acid is nonelevated and overall I do not think patient is septic.  BNP is 583 without recent to compare to her recent echo patient does not appear to be in acute heart failure exacerbation received 250 cc of LR on arrival will follow this was very small dose of Lasix she has possible very early edema on her chest x-ray.  CMP shows creatinine of 1.71 compared to 1.6 couple years ago without any other significant electrolyte metabolic derangements.  Magnesium is unremarkable.  TSH unremarkable.  Troponin slightly elevated at 48 I suspect this is related to some mild demand ischemia in the setting  of COVID and possible very mild heart failure.  Given recurrent falls and overall weakness will admit to medicine service.  Patient has no evidence of hypoxia.  Will defer steroids but will give a dose of remdesivir.       ____________________________________________   FINAL CLINICAL IMPRESSION(S) / ED DIAGNOSES  Final diagnoses:  Weakness  Cough  Recurrent falls  COVID    Medications  remdesivir 200 mg in sodium chloride 0.9% 250 mL IVPB (has no administration in time range)    Followed by  remdesivir 100 mg in sodium chloride 0.9 % 100 mL IVPB (has no administration in time range)  lactated ringers bolus 1,000 mL (0 mLs Intravenous Stopped 02/10/21 1120)  furosemide (LASIX) injection 20 mg (20 mg Intravenous Given 02/10/21 1146)     ED Discharge Orders     None        Note:  This document was prepared using Dragon voice recognition software and may include unintentional dictation errors.    Lucrezia Starch, MD 02/10/21 1144    Lucrezia Starch, MD 02/10/21 [phone]

## 2021-02-10 NOTE — ED Notes (Signed)
Messaged Dr Tamala Julian, per pt's daughter the pt was scheduled for ultrasound of her kidneys tomorrow and they would like to know if that can be done while she is in the hospital.

## 2021-02-10 NOTE — Consult Note (Signed)
Remdesivir - Pharmacy Brief Note   O:  ALT: 15 CXR: "low lung volumes, mild interstitial edema +/- pneumonitis SpO2: 94-98% ORA   A/P:  Remdesivir 200 mg IVPB once followed by 100 mg IVPB daily x 4 days.   Lorna Dibble, [facility] 02/10/2021 11:34 AM

## 2021-02-10 NOTE — ED Notes (Signed)
This EDT ambulated pt with pulse ox, pt o2 was consistent with 95% while ambulating, lowest dropped to 93% shortly but proceeded to go back up to 95% with no issues. Pt stated "she felt fine", with no SOB. Will report to RN of findings. Pt now laying down at 95% RA.

## 2021-02-10 NOTE — ED Notes (Signed)
Spoke with pts daughter, Stan Head, at [phone]. Pt gives verbal consent to discuss all of her medical information with this family member.

## 2021-02-10 NOTE — H&P (Addendum)
History and Physical    Amber Fields ZLD:[id number] DOB: [birth year] DOA: 02/10/2021  PCP: Amber Kayser, MD   Patient coming from: Home  I have personally briefly reviewed patient's old medical records in [facility]  Chief Complaint: Weakness  HPI: Amber Fields is a 87 y.o. 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  Patient states that she slid off her bed twice over the last 3 days and this morning she fell in the bathroom because she had difficulty getting off the commode.  She states that she has felt off for the last 3 to 4 days and just feels weak, fatigued and tired.  She has a cough that is nonproductive but denies having any fever or chills.  Her granddaughter has been sick. She received 2 doses of the COVID-19 vaccine as well as a booster shot. She denies having any abdominal pain, no nausea, no vomiting, no changes in her bowel habits, no urinary frequency, no nocturia, no dysuria, no headache, no dizziness, no lightheadedness, no palpitations, no diaphoresis, no chest pain or shortness of breath. Labs show sodium 137, potassium 4.7, chloride 109, bicarb 22, glucose 127, BUN 36, creatinine 1.7, calcium 9.1, magnesium 2.0, alkaline phosphatase 60, albumin 3.1, AST 27, ALT 15, total protein 6.1, total bilirubin 0.5, BNP 583.2, troponin 48, white count 10.7, hemoglobin 11.9, hematocrit 37.2, MCV 94.7, RDW 14.5, platelet count 207, TSH 1.1, Patient SARS coronavirus 2 point-of-care test is positive CT scan of the head without contrast and cervical spine shows no evidence of acute intracranial abnormality or evidence of acute fracture or traumatic malalignment.  Moderate multilevel degenerative change. Chest x-ray reviewed by me shows cardiomegaly.  Mild bilateral interstitial prominence. X-ray of the  pelvis shows no acute displaced pelvic fracture. Twelve-lead EKG reviewed by me shows sinus rhythm with right axis deviation.    ED Course: Patient is an 87 year old female who presents to the ER via EMS for evaluation of weakness and frequent falls at home. Per EMS she had a low-grade fever with a T-max of 100.5 and room air pulse oximetry was 91% which improved following administration of oxygen at 2 L.  Her pulse oximetry is currently 95%. Her SARS coronavirus 2 point-of-care test is positive but patient is vaccinated and received 1 booster shot. She received a dose of remdesivir in the ER and will be admitted to the hospital for further evaluation.     Review of Systems: As per HPI otherwise all other systems reviewed and negative.    Past Medical History:  Diagnosis Date   Cancer ([place])    skin ca    Past Surgical History:  Procedure Laterality Date   ABDOMINAL HYSTERECTOMY       has no history on file for tobacco use, alcohol use, and drug use.  Allergies  Allergen Reactions   Pravastatin Other (See Comments)    Other reaction(s): Dizziness, Muscle Pain   Codeine Other (See Comments)    Other reaction(s): Dizziness   Ibuprofen Other (See Comments)    Other reaction(s): Dizziness   Penicillins     Other reaction(s): Other (See Comments) Tongue and facial swelling Other reaction(s): Other (See Comments) Tongue and facial swelling    Sulfa Antibiotics Other (See Comments)   Lansoprazole Diarrhea    Family History  Problem Relation Age of Onset   Breast cancer  Cousin        mat cousin      Prior to Admission medications   Not on File    Physical Exam: Vitals:   02/10/21 0930 02/10/21 1100 02/10/21 1130 02/10/21 1230  BP: (!) 110/54 123/74 (!) 142/58 114/64  Pulse: (!) 54 (!) 50 (!) 50 (!) 50  Resp: 15 12 10 13   Temp:      TempSrc:      SpO2: 94% 96% 99% 96%  Weight:      Height:         Vitals:   02/10/21 0930 02/10/21 1100 02/10/21 1130  02/10/21 1230  BP: (!) 110/54 123/74 (!) 142/58 114/64  Pulse: (!) 54 (!) 50 (!) 50 (!) 50  Resp: 15 12 10 13   Temp:      TempSrc:      SpO2: 94% 96% 99% 96%  Weight:      Height:          Constitutional: Alert and oriented x 3 . Not in any apparent distress HEENT:      Head: Normocephalic and atraumatic.         Eyes: PERLA, EOMI, Conjunctivae are normal. Sclera is non-icteric.       Mouth/Throat: Mucous membranes are moist.       Neck: Supple with no signs of meningismus. Cardiovascular: Regular rate and rhythm. No murmurs, gallops, or rubs. 2+ symmetrical distal pulses are present . No JVD. No LE edema Respiratory: Respiratory effort normal .bilateral air entry in both lung fields Gastrointestinal: Soft, non tender, and non distended with positive bowel sounds.  Genitourinary: No CVA tenderness. Musculoskeletal: Nontender with normal range of motion in all extremities. No cyanosis, or erythema of extremities. Neurologic:  Face is symmetric. Moving all extremities. No gross focal neurologic deficits . Skin: Skin is warm, dry.  No rash or ulcers Psychiatric: Mood and affect are normal    Labs on Admission: I have personally reviewed following labs and imaging studies  CBC: Recent Labs  Lab 02/10/21 0738  WBC 10.7*  NEUTROABS 8.4*  HGB 11.9*  HCT 37.2  MCV 94.7  PLT 284   Basic Metabolic Panel: Recent Labs  Lab 02/10/21 0904  NA 137  K 4.7  CL 109  CO2 22  GLUCOSE 127*  BUN 36*  CREATININE 1.71*  CALCIUM 9.1  MG 2.0   GFR: Estimated Creatinine Clearance: 28.3 mL/min (A) (by C-G formula based on SCr of 1.71 mg/dL (H)). Liver Function Tests: Recent Labs  Lab 02/10/21 0904  AST 27  ALT 15  ALKPHOS 60  BILITOT 0.5  PROT 6.1*  ALBUMIN 3.1*   No results for input(s): LIPASE, AMYLASE in the last 168 hours. No results for input(s): AMMONIA in the last 168 hours. Coagulation Profile: No results for input(s): INR, PROTIME in the last 168 hours. Cardiac  Enzymes: No results for input(s): CKTOTAL, CKMB, CKMBINDEX, TROPONINI in the last 168 hours. BNP (last 3 results) No results for input(s): PROBNP in the last 8760 hours. HbA1C: No results for input(s): HGBA1C in the last 72 hours. CBG: No results for input(s): GLUCAP in the last 168 hours. Lipid Profile: No results for input(s): CHOL, HDL, LDLCALC, TRIG, CHOLHDL, LDLDIRECT in the last 72 hours. Thyroid Function Tests: Recent Labs    02/10/21 0904  TSH 1.147   Anemia Panel: No results for input(s): VITAMINB12, FOLATE, FERRITIN, TIBC, IRON, RETICCTPCT in the last 72 hours. Urine analysis: No results found for: COLORURINE, APPEARANCEUR, [place], [place], [place],  HGBUR, BILIRUBINUR, KETONESUR, PROTEINUR, UROBILINOGEN, NITRITE, LEUKOCYTESUR  Radiological Exams on Admission: CT Head Wo Contrast  Result Date: 02/10/2021 CLINICAL DATA:  Falls.  Weakness EXAM: CT HEAD WITHOUT CONTRAST CT CERVICAL SPINE WITHOUT CONTRAST TECHNIQUE: Multidetector CT imaging of the head and cervical spine was performed following the standard protocol without intravenous contrast. Multiplanar CT image reconstructions of the cervical spine were also generated. COMPARISON:  08/09/2013. FINDINGS: CT HEAD FINDINGS Brain: No evidence of acute large vascular territory infarction, hemorrhage, hydrocephalus, extra-axial collection or mass lesion/mass effect. Mild patchy white matter hypoattenuation, nonspecific but most likely related to chronic microvascular ischemic disease. Mild atrophy with ex vacuo ventricular dilation. Partially empty sella. Vascular: Calcific intracranial atherosclerosis. Skull: No acute fracture. Sinuses/Orbits: Mild pansinus mucosal thickening with small air-fluid levels in the left sphenoid sinus and left maxillary sinus. Other: No mastoid effusions. CT CERVICAL SPINE FINDINGS Alignment: Similar alignment. Similar straightening with mild anterolisthesis of C4 on C5. Skull base and vertebrae: No evidence  of acute fracture. Vertebral body heights are maintained. Soft tissues and spinal canal: No prevertebral fluid or swelling. No visible canal hematoma. Disc levels: Multilevel degenerative disc disease, greatest at C5-C6 and C6-C7 with disc height loss and endplate spurring. Multilevel facet and uncovertebral hypertrophy with varying degrees of neural foraminal stenosis, greatest on the left at C3-C4. Upper chest: Not visualized. Other: Calcific atherosclerosis bilateral carotid arteries. IMPRESSION: CT head: 1. No evidence of acute intracranial abnormality. 2. Mild chronic microvascular ischemic disease and atrophy. 3. Mild paranasal sinus disease, as described above. CT cervical spine: 1. No evidence of acute fracture or traumatic malalignment. 2. Moderate multilevel degenerative change, as detailed above. Electronically Signed   By: Margaretha Sheffield MD   On: 02/10/2021 09:29   CT Cervical Spine Wo Contrast  Result Date: 02/10/2021 CLINICAL DATA:  Falls.  Weakness EXAM: CT HEAD WITHOUT CONTRAST CT CERVICAL SPINE WITHOUT CONTRAST TECHNIQUE: Multidetector CT imaging of the head and cervical spine was performed following the standard protocol without intravenous contrast. Multiplanar CT image reconstructions of the cervical spine were also generated. COMPARISON:  08/09/2013. FINDINGS: CT HEAD FINDINGS Brain: No evidence of acute large vascular territory infarction, hemorrhage, hydrocephalus, extra-axial collection or mass lesion/mass effect. Mild patchy white matter hypoattenuation, nonspecific but most likely related to chronic microvascular ischemic disease. Mild atrophy with ex vacuo ventricular dilation. Partially empty sella. Vascular: Calcific intracranial atherosclerosis. Skull: No acute fracture. Sinuses/Orbits: Mild pansinus mucosal thickening with small air-fluid levels in the left sphenoid sinus and left maxillary sinus. Other: No mastoid effusions. CT CERVICAL SPINE FINDINGS Alignment: Similar  alignment. Similar straightening with mild anterolisthesis of C4 on C5. Skull base and vertebrae: No evidence of acute fracture. Vertebral body heights are maintained. Soft tissues and spinal canal: No prevertebral fluid or swelling. No visible canal hematoma. Disc levels: Multilevel degenerative disc disease, greatest at C5-C6 and C6-C7 with disc height loss and endplate spurring. Multilevel facet and uncovertebral hypertrophy with varying degrees of neural foraminal stenosis, greatest on the left at C3-C4. Upper chest: Not visualized. Other: Calcific atherosclerosis bilateral carotid arteries. IMPRESSION: CT head: 1. No evidence of acute intracranial abnormality. 2. Mild chronic microvascular ischemic disease and atrophy. 3. Mild paranasal sinus disease, as described above. CT cervical spine: 1. No evidence of acute fracture or traumatic malalignment. 2. Moderate multilevel degenerative change, as detailed above. Electronically Signed   By: Margaretha Sheffield MD   On: 02/10/2021 09:29   DG Pelvis Portable  Result Date: 02/10/2021 CLINICAL DATA:  Fall EXAM: PORTABLE PELVIS 1-2  VIEWS COMPARISON:  None. FINDINGS: There is no evidence of displaced pelvic fracture or diastasis. No pelvic bone lesions are seen. IMPRESSION: No displaced fracture or dislocation of the pelvis or bilateral proximal femurs in single frontal view. Please note that plain radiographs are significantly insensitive for hip and pelvic fracture; recommend CT or MRI to most sensitively evaluate for fracture if suspected. Electronically Signed   By: Eddie Candle M.D.   On: 02/10/2021 08:22   DG Chest Portable 1 View  Result Date: 02/10/2021 CLINICAL DATA:  Cough.  Weakness. EXAM: PORTABLE CHEST 1 VIEW COMPARISON:  08/09/2013. FINDINGS: Cardiomegaly. Lung volumes. Mild bilateral interstitial prominence. Mild interstitial edema and/or pneumonitis cannot be completely excluded. Tiny left pleural effusion cannot be excluded. No pneumothorax.  IMPRESSION: Cardiomegaly. Low lung volumes. Mild bilateral interstitial prominence. Mild interstitial edema and/or pneumonitis cannot be excluded. Tiny left pleural effusion cannot be excluded. Electronically Signed   By: Marcello Moores  Register   On: 02/10/2021 08:22     Assessment/Plan Active Problems:   COVID-19 virus infection   Weakness   Fall at home, initial encounter    COVID-19 viral infection Patient presents for evaluation of weakness and frequent falls Her SARS coronavirus 2 point-of-care test is positive She received 2 doses of the COVID-19 vaccine as well as 1 booster shot Continue remdesivir initiated in the ER Hold off on systemic steroids for now unless imaging shows findings suggestive of pneumonia Supportive care with bronchodilator therapy, antitussives and vitamins Monitor serial inflammatory markers     Weakness secondary to COVID-19 viral infection With frequent falls at home Place patient on fall precautions Will request PT evaluation Obtain CK levels.   Stage IV chronic kidney disease Renal function is stable Monitor closely during this hospitalization   DVT prophylaxis: Lovenox  Code Status: full code  Family Communication: Greater than 50% of time was spent discussing patient's condition and plan of care with her at the bedside.  All questions and concerns have been addressed.  She verbalizes understanding and agrees with the plan. Disposition Plan: Back to previous home environment Consults called: Physical therapy Status: At the time of admission, it appears that the appropriate admission status for this patient is inpatient. This is judged to be reasonable and necessary in order to provide the required intensity of service to ensure the patient's safety given the presenting symptoms, school exam findings, and initial radiographic and laboratory data in the context of their comorbid conditions. Patient requires inpatient status due to high intensity of  service, high risk for further deterioration and high frequency of surveillance required.    Collier Bullock MD [facility]     02/10/2021, 1:08 PM

## 2021-02-10 NOTE — ED Notes (Signed)
Called lab to obtain repeat troponin.

## 2021-02-10 NOTE — ED Notes (Signed)
Pt resting comfortably, denies needs at this time. Stretcher locked in low position with side rails up x2, call light and personal belongings in reach. High fall precautions in place.

## 2021-02-10 NOTE — ED Triage Notes (Signed)
Pt comes ems from home with weakness and feeling off for 3 days. Daughter states recent falls. Denies any pain. Aox4. CBG 138. Pt was 91% on RA with EMS and was placed on 2L [place] but is 97% on RA at this time. Pt was 100.5 orally with ems.

## 2021-02-10 NOTE — ED Notes (Signed)
Will attempt to collect urine sample without using in and out cath per verbal ok from Dr Creig Hines.

## 2021-02-11 ENCOUNTER — Encounter: Payer: Self-pay | Admitting: Internal Medicine

## 2021-02-11 LAB — COMPREHENSIVE METABOLIC PANEL
ALT: 15 U/L (ref 0–44)
AST: 30 U/L (ref 15–41)
Albumin: 3.2 g/dL — ABNORMAL LOW (ref 3.5–5.0)
Alkaline Phosphatase: 58 U/L (ref 38–126)
Anion gap: 6 (ref 5–15)
BUN: 42 mg/dL — ABNORMAL HIGH (ref 8–23)
CO2: 23 mmol/L (ref 22–32)
Calcium: 9.3 mg/dL (ref 8.9–10.3)
Chloride: 110 mmol/L (ref 98–111)
Creatinine, Ser: 1.83 mg/dL — ABNORMAL HIGH (ref 0.44–1.00)
GFR, Estimated: 27 mL/min — ABNORMAL LOW (ref >60)
Glucose, Bld: 81 mg/dL (ref 70–99)
Potassium: 4.3 mmol/L (ref 3.5–5.1)
Sodium: 139 mmol/L (ref 135–145)
Total Bilirubin: 0.5 mg/dL (ref 0.3–1.2)
Total Protein: 6.4 g/dL — ABNORMAL LOW (ref 6.5–8.1)

## 2021-02-11 LAB — IRON AND TIBC
Iron: 22 ug/dL — ABNORMAL LOW (ref 28–170)
Saturation Ratios: 18 % (ref 10.4–31.8)
TIBC: 126 ug/dL — ABNORMAL LOW (ref 250–450)
UIBC: 104 ug/dL

## 2021-02-11 LAB — CBC WITH DIFFERENTIAL/PLATELET
Abs Immature Granulocytes: 0.03 10*3/uL (ref 0.00–0.07)
Basophils Absolute: 0 10*3/uL (ref 0.0–0.1)
Basophils Relative: 1 %
Eosinophils Absolute: 0.1 10*3/uL (ref 0.0–0.5)
Eosinophils Relative: 1 %
HCT: 37.3 % (ref 36.0–46.0)
Hemoglobin: 12.3 g/dL (ref 12.0–15.0)
Immature Granulocytes: 0 %
Lymphocytes Relative: 30 %
Lymphs Abs: 2.2 10*3/uL (ref 0.7–4.0)
MCH: 30.2 pg (ref 26.0–34.0)
MCHC: 33 g/dL (ref 30.0–36.0)
MCV: 91.6 fL (ref 80.0–100.0)
Monocytes Absolute: 0.7 10*3/uL (ref 0.1–1.0)
Monocytes Relative: 9 %
Neutro Abs: 4.4 10*3/uL (ref 1.7–7.7)
Neutrophils Relative %: 59 %
Platelets: 208 10*3/uL (ref 150–400)
RBC: 4.07 MIL/uL (ref 3.87–5.11)
RDW: 14.5 % (ref 11.5–15.5)
WBC: 7.4 10*3/uL (ref 4.0–10.5)
nRBC: 0 % (ref 0.0–0.2)

## 2021-02-11 LAB — C-REACTIVE PROTEIN: CRP: 10.2 mg/dL — ABNORMAL HIGH (ref <1.0)

## 2021-02-11 LAB — GLUCOSE, CAPILLARY
Glucose-Capillary: 218 mg/dL — ABNORMAL HIGH (ref 70–99)
Glucose-Capillary: 357 mg/dL — ABNORMAL HIGH (ref 70–99)

## 2021-02-11 LAB — D-DIMER, QUANTITATIVE: D-Dimer, Quant: 1.08 ug/mL-FEU — ABNORMAL HIGH (ref 0.00–0.50)

## 2021-02-11 LAB — VITAMIN D 25 HYDROXY (VIT D DEFICIENCY, FRACTURES): Vit D, 25-Hydroxy: 100.59 ng/mL — ABNORMAL HIGH (ref 30–100)

## 2021-02-11 LAB — MAGNESIUM: Magnesium: 2 mg/dL (ref 1.7–2.4)

## 2021-02-11 LAB — VITAMIN B12: Vitamin B-12: 202 pg/mL (ref 180–914)

## 2021-02-11 LAB — FOLATE: Folate: 16.2 ng/mL (ref >5.9)

## 2021-02-11 LAB — FERRITIN: Ferritin: 176 ng/mL (ref 11–307)

## 2021-02-11 LAB — PHOSPHORUS: Phosphorus: 3.1 mg/dL (ref 2.5–4.6)

## 2021-02-11 MED ORDER — DEXAMETHASONE SODIUM PHOSPHATE 10 MG/ML IJ SOLN
6.0000 mg | INTRAMUSCULAR | Status: DC
  Administered 2021-02-11 – 2021-02-12 (×2): 6 mg via INTRAVENOUS
  Filled 2021-02-11 (×2): qty 1

## 2021-02-11 MED ORDER — INSULIN ASPART 100 UNIT/ML IJ SOLN
0.0000 [IU] | Freq: Three times a day (TID) | INTRAMUSCULAR | Status: DC
  Administered 2021-02-11 – 2021-02-12 (×3): 3 [IU] via SUBCUTANEOUS
  Filled 2021-02-11 (×3): qty 1

## 2021-02-11 MED ORDER — INSULIN ASPART 100 UNIT/ML IJ SOLN
0.0000 [IU] | Freq: Every day | INTRAMUSCULAR | Status: DC
  Administered 2021-02-11: 5 [IU] via SUBCUTANEOUS
  Filled 2021-02-11: qty 1

## 2021-02-11 NOTE — Progress Notes (Signed)
Spoke with the daughter [name] to update the status of her mother. She is currently stable at this time in no acute distress.Thyroid US  is currently pending at this time.

## 2021-02-11 NOTE — Progress Notes (Signed)
[facility] Progress Note  Patient: Amber Fields    IDP:[id number]  DOA: 02/10/2021     Date of Service: the patient was seen and examined on 02/11/2021  Chief Complaint  Patient presents with   Weakness   Brief hospital course: [name] is a 87 y.o. 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  Patient did slide from her bed twice over the last 3 days and today she could not get off the commode.  She received 2 doses of the COVID-19 vaccine as well as a booster shot.  The patient was brought in to the ED via EMS due to weakness and fall at home Per EMS she had a low-grade fever with a T-max of 100.5 and room air pulse oximetry was 91% which improved following administration of oxygen at 2 L.  Her pulse oximetry is currently 95%. Her SARS coronavirus 2 point-of-care test is positive but patient is vaccinated and received 1 booster shot. She received a dose of remdesivir in the ER and admitted to the hospital for further management as below.   Assessment and Plan: Active Problems:   COVID-19 virus infection   Weakness   Fall at home, initial encounter     COVID-19 viral infection Patient presents for evaluation of weakness and frequent falls Her SARS coronavirus 2 point-of-care test is positive She received 2 doses of the COVID-19 vaccine as well as 1 booster shot Continue remdesivir initiated in the ER, continue remdesivir for 5 days Continue Decadron 6 mg IV daily for 10 days Supportive care with bronchodilator therapy, antitussives and vitamins Monitor serial inflammatory markers CT chest Multilobar peribronchovascular ground-glass and consolidation may be due to COVID-19 pneumonia. Tiny bilateral pleural effusions. Currently patient is on room air   Thyroid nodule, incidental finding on CT  chest 1.8 cm right thyroid nodule. Recommend thyroid US.  TSH within normal range Follow thyroid sonogram Follow-up with PCP for further management as an outpatient     Weakness secondary to COVID-19 viral infection With frequent falls at home Place patient on fall precautions Follow PT and OT eval CK levels 544 elevated    Elevated troponin, troponin 48--43 remained flat Patient denied any chest pain.  Most likely demand ischemia Follow 2D echocardiogram   stage IV chronic kidney disease Renal function is stable Monitor closely during this hospitalization  NIDDM T2 Held home medications for now Started NovoLog sliding scale Monitor FSBG and continue diabetic diet  Body mass index is 30.41 kg/m.  Interventions:       Diet: Heart healthy and carb modified DVT Prophylaxis: Subcutaneous Lovenox   Advance goals of care discussion: Full code  Family Communication: family was present at bedside, at the time of interview.  The pt provided permission to discuss medical plan with the family. Opportunity was given to ask question and all questions were answered satisfactorily.   Disposition:  Pt is from Home, admitted with weakness and a fall, found to have COVID positive, still has weakness and at high risk for fall, which precludes a safe discharge. Discharge to home with home PT, when medically stable most likely in 1 to 2 days.  Subjective: No acute overnight events, patient was admitted due to weakness and fall.  Patient was having some cough for the past few days, denies any fever chills.  Now patient feels improvement,  no active issues.  Patient denied any chest pain or palpitations, no shortness of breath.  Currently saturating well on room air  Physical Exam: General:  alert oriented to time, place, and person.  Appear in no distress, affect appropriate Eyes: PERRLA ENT: Oral Mucosa Clear, moist  Neck: no JVD,  Cardiovascular: S1 and S2 Present, no Murmur,   Respiratory: good respiratory effort, Bilateral Air entry equal and Decreased, no Crackles, no wheezes Abdomen: Bowel Sound present, Soft and no tenderness,  Skin: no rashes Extremities: no Pedal edema, no calf tenderness Neurologic: without any new focal findings Gait not checked due to patient safety concerns  Vitals:   02/10/21 2300 02/10/21 2348 02/11/21 0624 02/11/21 0817  BP: (!) 128/52 (!) 124/55 137/62 (!) 121/50  Pulse: (!) 57 (!) 59 (!) 59 71  Resp: 20 16 16 20   Temp:  98.2 F (36.8 C) 97.9 F (36.6 C) 98 F (36.7 C)  TempSrc:  Oral Oral Oral  SpO2: 95% 97% 98% 98%  Weight:      Height:        Intake/Output Summary (Last 24 hours) at 02/11/2021 1533 Last data filed at 02/11/2021 1500 Gross per 24 hour  Intake 100 ml  Output 900 ml  Net -800 ml   Filed Weights   02/10/21 0730  Weight: 90.7 kg    Data Reviewed: I have personally reviewed and interpreted daily labs, tele strips, imagings as discussed above. I reviewed all nursing notes, pharmacy notes, vitals, pertinent old records I have discussed plan of care as described above with RN and patient/family.  CBC: Recent Labs  Lab 02/10/21 0738 02/11/21 0509  WBC 10.7* 7.4  NEUTROABS 8.4* 4.4  HGB 11.9* 12.3  HCT 37.2 37.3  MCV 94.7 91.6  PLT 207 267   Basic Metabolic Panel: Recent Labs  Lab 02/10/21 0904 02/11/21 0509  NA 137 139  K 4.7 4.3  CL 109 110  CO2 22 23  GLUCOSE 127* 81  BUN 36* 42*  CREATININE 1.71* 1.83*  CALCIUM 9.1 9.3  MG 2.0 2.0  PHOS  --  3.1    Studies: No results found.  Scheduled Meds:  albuterol  2 puff Inhalation Q6H   vitamin C  500 mg Oral Daily   dexamethasone (DECADRON) injection  6 mg Intravenous Q24H   enoxaparin (LOVENOX) injection  30 mg Subcutaneous Q24H   insulin aspart  0-5 Units Subcutaneous QHS   insulin aspart  0-9 Units Subcutaneous TID WC   sodium chloride flush  3 mL Intravenous Q12H   zinc sulfate  220 mg Oral Daily   Continuous Infusions:   sodium chloride     remdesivir 100 mg in NS 100 mL 100 mg (02/11/21 1213)   PRN Meds: sodium chloride, guaiFENesin-dextromethorphan, sodium chloride flush  Time spent: 35 minutes  Author: Val Riles. MD [facility] 02/11/2021 3:33 PM  To reach On-call, see care teams to locate the attending and reach out to them via [email or web address]. If 7PM-7AM, please contact night-coverage If you still have difficulty reaching the attending provider, please page the [facility] (Director on Call) for [facility] on amion for assistance.

## 2021-02-11 NOTE — Evaluation (Signed)
Physical Therapy Evaluation Patient Details Name: Amber Fields MRN: [id number] DOB: [birth year] Today's Date: 02/11/2021   History of Present Illness  Patient is a 87 year old female who presents to the ER via EMS for evaluation of weakness and frequent falls at home. COVID +. 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 Patient reports she is previously independent, lives with family and reports 4 recent falls at home. She states most of the falls involve slipping out of bed.   Patient required minimal assistance for bed mobility and standing. She ambulated a short distance in the room with rolling walker but activity tolerance limited by nausea and vomiting. Patient had several bouts of vomiting while seated at edge of bed that subsided. She does appear to have generalized weakness and deconditioned. Sp02 99% on room air after activity. Recommend PT follow up to maximize independence and facilitate return to prior level of function. Patient wants to discharge home when medically ready. Recommend HHPT and supervision/assistance from family as needed. If family is unable to provide assistance/supervision, consider SNF for short term rehab.     Follow Up Recommendations Home health PT;Supervision for mobility/OOB    Equipment Recommendations  Rolling walker with 5" wheels;3in1 (PT)    Recommendations for Other Services       Precautions / Restrictions Precautions Precautions: Fall Restrictions Weight Bearing Restrictions: No      Mobility  Bed Mobility Overal bed mobility: Needs Assistance Bed Mobility: Supine to Sit;Sit to Supine     Supine to sit: Min assist Sit to supine: Supervision   General bed mobility comments: assistance for trunk support to sit upright    Transfers Overall transfer level: Needs assistance Equipment used: Rolling walker (2  wheeled) Transfers: Sit to/from Stand Sit to Stand: Min assist         General transfer comment: lifting assistance required for standing. verbal cues for technique and safety  Ambulation/Gait Ambulation/Gait assistance: Supervision;Min guard Gait Distance (Feet): 8 Feet Assistive device: Rolling walker (2 wheeled) Gait Pattern/deviations: Step-through pattern Gait velocity: decreased   General Gait Details: activity tolerance limited as patient reports nausea and begins actively vomiting. no loss of balance with short ambulation distance in room using rolling walker. Sp02 99% on room air and heart rate 66bpm after walking  Stairs            Wheelchair Mobility    Modified Rankin (Stroke Patients Only)       Balance Overall balance assessment: Needs assistance Sitting-balance support: Feet supported Sitting balance-Leahy Scale: Good     Standing balance support: Bilateral upper extremity supported Standing balance-Leahy Scale: Fair Standing balance comment: patient relying on rolling walker for support in standing                             Pertinent Vitals/Pain Pain Assessment: No/denies pain    Home Living Family/patient expects to be discharged to:: Private residence Living Arrangements: Children;Other relatives Available Help at Discharge: Family;Available PRN/intermittently Type of Home: Mobile home Home Access: Stairs to enter   Entrance Stairs-Number of Steps: 8 Home Layout: One level Home Equipment: None      Prior Function Level of Independence: Independent         Comments: patient reports 4 falls in the past month     Hand Dominance        Extremity/Trunk Assessment  Upper Extremity Assessment Upper Extremity Assessment: Generalized weakness    Lower Extremity Assessment Lower Extremity Assessment: Generalized weakness       Communication   Communication: No difficulties  Cognition Arousal/Alertness:  Awake/alert Behavior During Therapy: WFL for tasks assessed/performed Overall Cognitive Status: Within Functional Limits for tasks assessed                                 General Comments: patient able to follow single step commands without difficulty      General Comments General comments (skin integrity, edema, etc.): patient stopped vomting once she returned to bed    Exercises     Assessment/Plan    PT Assessment Patient needs continued PT services  PT Problem List Decreased strength;Decreased activity tolerance;Decreased balance;Decreased mobility;Decreased safety awareness;Decreased knowledge of precautions       PT Treatment Interventions DME instruction;Gait training;Stair training;Functional mobility training;Therapeutic activities;Therapeutic exercise;Balance training;Neuromuscular re-education;Patient/family education    PT Goals (Current goals can be found in the Care Plan section)  Acute Rehab PT Goals Patient Stated Goal: to go home PT Goal Formulation: With patient Time For Goal Achievement: 02/25/21 Potential to Achieve Goals: Good    Frequency Min 2X/week   Barriers to discharge        Co-evaluation               AM-PAC PT "6 Clicks" Mobility  Outcome Measure Help needed turning from your back to your side while in a flat bed without using bedrails?: None Help needed moving from lying on your back to sitting on the side of a flat bed without using bedrails?: A Little Help needed moving to and from a bed to a chair (including a wheelchair)?: A Little Help needed standing up from a chair using your arms (e.g., wheelchair or bedside chair)?: A Little Help needed to walk in hospital room?: A Little Help needed climbing 3-5 steps with a railing? : A Little 6 Click Score: 19    End of Session   Activity Tolerance: Treatment limited secondary to medical complications (Comment) (vomiting) Patient left: in bed;with call bell/phone within  reach;with bed alarm set Nurse Communication: Mobility status PT Visit Diagnosis: Unsteadiness on feet (R26.81);Muscle weakness (generalized) (M62.81);History of falling (Z91.81)    Time: 7939-0300 PT Time Calculation (min) (ACUTE ONLY): 27 min   Charges:   PT Evaluation $PT Eval Moderate Complexity: 1 Mod PT Treatments $Therapeutic Activity: 8-22 mins        Minna Merritts, PT, MPT   Percell Locus 02/11/2021, 10:38 AM

## 2021-02-12 ENCOUNTER — Inpatient Hospital Stay
Admit: 2021-02-12 | Discharge: 2021-02-12 | Disposition: A | Discharge status: Home / Self Care | Payer: Medicare Other | Attending: Student | Admitting: Student

## 2021-02-12 LAB — CBC WITH DIFFERENTIAL/PLATELET
Abs Immature Granulocytes: 0.05 10*3/uL (ref 0.00–0.07)
Basophils Absolute: 0 10*3/uL (ref 0.0–0.1)
Basophils Relative: 0 %
Eosinophils Absolute: 0 10*3/uL (ref 0.0–0.5)
Eosinophils Relative: 0 %
HCT: 37.5 % (ref 36.0–46.0)
Hemoglobin: 12.4 g/dL (ref 12.0–15.0)
Immature Granulocytes: 1 %
Lymphocytes Relative: 23 %
Lymphs Abs: 1.5 10*3/uL (ref 0.7–4.0)
MCH: 30.1 pg (ref 26.0–34.0)
MCHC: 33.1 g/dL (ref 30.0–36.0)
MCV: 91 fL (ref 80.0–100.0)
Monocytes Absolute: 0.5 10*3/uL (ref 0.1–1.0)
Monocytes Relative: 8 %
Neutro Abs: 4.4 10*3/uL (ref 1.7–7.7)
Neutrophils Relative %: 68 %
Platelets: 231 10*3/uL (ref 150–400)
RBC: 4.12 MIL/uL (ref 3.87–5.11)
RDW: 14.3 % (ref 11.5–15.5)
WBC: 6.4 10*3/uL (ref 4.0–10.5)
nRBC: 0 % (ref 0.0–0.2)

## 2021-02-12 LAB — ECHOCARDIOGRAM COMPLETE
AR max vel: 1.62 cm2
AV Area VTI: 1.45 cm2
AV Area mean vel: 1.53 cm2
AV Mean grad: 4 mmHg
AV Peak grad: 7.8 mmHg
Ao pk vel: 1.4 m/s
Area-P 1/2: 3.43 cm2
Height: 68 in
MV VTI: 1.67 cm2
S' Lateral: 3.3 cm
Weight: 3200 oz

## 2021-02-12 LAB — COMPREHENSIVE METABOLIC PANEL
ALT: 16 U/L (ref 0–44)
AST: 29 U/L (ref 15–41)
Albumin: 3.1 g/dL — ABNORMAL LOW (ref 3.5–5.0)
Alkaline Phosphatase: 63 U/L (ref 38–126)
Anion gap: 3 — ABNORMAL LOW (ref 5–15)
BUN: 50 mg/dL — ABNORMAL HIGH (ref 8–23)
CO2: 23 mmol/L (ref 22–32)
Calcium: 9.5 mg/dL (ref 8.9–10.3)
Chloride: 109 mmol/L (ref 98–111)
Creatinine, Ser: 1.89 mg/dL — ABNORMAL HIGH (ref 0.44–1.00)
GFR, Estimated: 26 mL/min — ABNORMAL LOW (ref >60)
Glucose, Bld: 302 mg/dL — ABNORMAL HIGH (ref 70–99)
Potassium: 4.6 mmol/L (ref 3.5–5.1)
Sodium: 135 mmol/L (ref 135–145)
Total Bilirubin: 0.6 mg/dL (ref 0.3–1.2)
Total Protein: 6.3 g/dL — ABNORMAL LOW (ref 6.5–8.1)

## 2021-02-12 LAB — CK: Total CK: 290 U/L — ABNORMAL HIGH (ref 38–234)

## 2021-02-12 LAB — C-REACTIVE PROTEIN: CRP: 7.5 mg/dL — ABNORMAL HIGH (ref <1.0)

## 2021-02-12 LAB — D-DIMER, QUANTITATIVE: D-Dimer, Quant: 0.99 ug/mL-FEU — ABNORMAL HIGH (ref 0.00–0.50)

## 2021-02-12 LAB — PHOSPHORUS: Phosphorus: 3.4 mg/dL (ref 2.5–4.6)

## 2021-02-12 LAB — GLUCOSE, CAPILLARY
Glucose-Capillary: 217 mg/dL — ABNORMAL HIGH (ref 70–99)
Glucose-Capillary: 234 mg/dL — ABNORMAL HIGH (ref 70–99)

## 2021-02-12 LAB — MAGNESIUM: Magnesium: 2.1 mg/dL (ref 1.7–2.4)

## 2021-02-12 LAB — FERRITIN: Ferritin: 237 ng/mL (ref 11–307)

## 2021-02-12 MED ORDER — ASCORBIC ACID 500 MG PO TABS: 500.0000 mg | ORAL_TABLET | Freq: Every day | ORAL | 2 refills | Status: AC

## 2021-02-12 MED ORDER — POLYSACCHARIDE IRON COMPLEX 150 MG PO CAPS
150.0000 mg | ORAL_CAPSULE | Freq: Every day | ORAL | Status: DC
  Administered 2021-02-12: 150 mg via ORAL
  Filled 2021-02-12: qty 1

## 2021-02-12 MED ORDER — GUAIFENESIN-DM 100-10 MG/5ML PO SYRP: 10.0000 mL | ORAL_SOLUTION | ORAL | 0 refills | Status: AC | PRN

## 2021-02-12 MED ORDER — ASPIRIN EC 81 MG PO TBEC
81.0000 mg | DELAYED_RELEASE_TABLET | Freq: Every day | ORAL | Status: DC
  Administered 2021-02-12: 81 mg via ORAL
  Filled 2021-02-12: qty 1

## 2021-02-12 MED ORDER — CYANOCOBALAMIN 1000 MCG/ML IJ SOLN
1000.0000 ug | Freq: Every day | INTRAMUSCULAR | Status: DC
  Administered 2021-02-12: 1000 ug via INTRAMUSCULAR
  Filled 2021-02-12: qty 1

## 2021-02-12 MED ORDER — LINAGLIPTIN 5 MG PO TABS
5.0000 mg | ORAL_TABLET | Freq: Every day | ORAL | Status: DC
  Administered 2021-02-12: 5 mg via ORAL
  Filled 2021-02-12: qty 1

## 2021-02-12 MED ORDER — CYANOCOBALAMIN 500 MCG PO TABS: 500.0000 ug | ORAL_TABLET | Freq: Every day | ORAL | 2 refills | Status: AC

## 2021-02-12 MED ORDER — POLYSACCHARIDE IRON COMPLEX 150 MG PO CAPS: 150.0000 mg | ORAL_CAPSULE | Freq: Every day | ORAL | 2 refills | Status: AC

## 2021-02-12 MED ORDER — VITAMIN B-12 1000 MCG PO TABS: 500.0000 ug | ORAL_TABLET | Freq: Every day | ORAL | Status: DC

## 2021-02-12 NOTE — Care Management Important Message (Signed)
Important Message  Patient Details  Name: JAHLIA OMURA MRN: [id number] Date of Birth: 05/24/1933   Medicare Important Message Given:  N/A - LOS <3 / Initial given by admissions  Initial Medicare IM reviewed with patient by Legrand Rams, Patient Access Associate on 02/10/2021 at 11:25pm.   Dannette Barbara 02/12/2021, 1:02 PM

## 2021-02-12 NOTE — Progress Notes (Signed)
Mobility Specialist - Progress Note   02/12/21 1100  Mobility  Activity Ambulated in room  Level of Assistance Standby assist, set-up cues, supervision of patient - no hands on  Assistive Device Front wheel walker;Other (Comment) (HHA)  Distance Ambulated (ft) 50 ft  Mobility Ambulated with assistance in room  Mobility Response Tolerated well  Mobility performed by Mobility specialist  $Mobility charge 1 Mobility    Pre-mobility: 63 HR, 95% SpO2 Post-mobility: 60 HR, 94% SpO2   Pt ambulated in room with and without AD. Supervision to reach EOB, MinA to stand. Mildly unsteady during ambulation with HHA. RW use for remaining distance with improvement in balance. Denied SOB on RA. Denied pain and nausea. Pt left in bed with alarm set.    Kathee Delton Mobility Specialist 02/12/21, 11:09 AM

## 2021-02-12 NOTE — TOC Initial Note (Signed)
Transition of Care [facility]) - Initial/Assessment Note    Patient Details  Name: Amber Fields MRN: [id number] Date of Birth: 1932/09/15  [facility]) CM/SW Contact:    Beverly Sessions, RN Phone Number: 02/12/2021, 10:00 AM  Clinical Narrative:                  Patient admitted from home Covid positive. Due to isolation precautions attempted to reach patient by phone Left VM for daughter to complete assessment.  Awaiting return call  Per chart review.  Patient lives at home with children, PCP Dorthula Perfect.  Pharmacy [facility].  No DME in the home PT Recommending home health        Patient Goals and CMS Choice        Expected Discharge Plan and Services                                                Prior Living Arrangements/Services                       Activities of Daily Living Home Assistive Devices/Equipment: None ADL Screening (condition at time of admission) Patient's cognitive ability adequate to safely complete daily activities?: Yes Is the patient deaf or have difficulty hearing?: Yes Does the patient have difficulty seeing, even when wearing glasses/contacts?: No Does the patient have difficulty concentrating, remembering, or making decisions?: No Patient able to express need for assistance with ADLs?: Yes Does the patient have difficulty dressing or bathing?: No Independently performs ADLs?: Yes (appropriate for developmental age) Does the patient have difficulty walking or climbing stairs?: Yes Weakness of Legs: Both Weakness of Arms/Hands: None  Permission Sought/Granted                  Emotional Assessment              Admission diagnosis:  Cough [R05.9] Weakness [R53.1] Recurrent falls [R29.6] COVID [U07.1] COVID-19 virus infection [U07.1] Patient Active Problem List   Diagnosis Date Noted   COVID-19 virus infection 02/10/2021   Weakness 02/10/2021   Fall at home, initial encounter 02/10/2021    PCP:  Ezequiel Kayser, MD Pharmacy:   [facility] [place], [address] [place] [address] Phone: [phone] Fax: [phone]     Social Determinants of Health (SDOH) Interventions    Readmission Risk Interventions No flowsheet data found.

## 2021-02-12 NOTE — Discharge Summary (Signed)
[facility] Discharge Summary   Patient: Amber Fields  PCP: Ezequiel Kayser, MD  Date of admission: 02/10/2021   Date of discharge:  02/12/2021     Discharge Diagnoses:  Principal diagnosis   COVID-19 viral infection Active Problems:   Weakness   Fall at home, initial encounter   Admitted From: Home Disposition:  Home with [facility] PT  Recommendations for Outpatient Follow-up:  PCP: in 1 wk Follow up LABS/TEST: Thyroid sonogram for thyroid nodule work-up as an outpatient   Diet recommendation: Cardiac and Carb modified diet  Activity: The patient is advised to gradually reintroduce usual activities, as tolerated  Discharge Condition: stable  Code Status: Full code   History of present illness: As per the H and P dictated on admission Amber Fields is a 87 y.o. 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  Patient did slide from her bed twice over the last 3 days and today she could not get off the commode.  She received 2 doses of the COVID-19 vaccine as well as a booster shot.  The patient was brought in to the ED via EMS due to weakness and fall at home Per EMS she had a low-grade fever with a T-max of 100.5 and room air pulse oximetry was 91% which improved following administration of oxygen at 2 L.  Her pulse oximetry is currently 95%. Her SARS coronavirus 2 point-of-care test is positive but patient is vaccinated and received 1 booster shot. She received a dose of remdesivir in the ER and admitted to the hospital for further management as below. Hospital Course:  # COVID-19 viral infection, Patient presents for evaluation of weakness and frequent falls Her SARS coronavirus 2 point-of-care test is positive. She received 2 doses of the COVID-19 vaccine as well as 1 booster shot. S/p remdesivir  and Decadron 6 mg IV daily, Supportive care with bronchodilator therapy, antitussives and vitamins. CT chest Multilobar peribronchovascular ground-glass and consolidation may be due to COVID-19 pneumonia. Tiny bilateral pleural effusions. Currently patient is on room air.  Patient was not having any active symptoms of pneumonia, felt comfortable to be discharged home. # Thyroid nodule, incidental finding on CT chest, 1.8 cm right thyroid nodule. Recommend thyroid US. TSH within normal range, follow with PCP and thyroid sonogram needs to be done as an outpatient.  Patient is aware and she is already following. # Weakness secondary to COVID-19 viral infection, With frequent falls at home.  Patient was seen by PT and OT, recommended home health and PT, no O2 requirements, rolling walker. CK levels 544 elevated, continue oral hydration. # Elevated troponin, troponin 48--43 remained flat, Patient denied any chest pain.  Most likely demand ischemia, TTE shows LVEF 60%, no wall motion abnormality. # stage IV chronic kidney disease, Renal function is stable.  Follow with PCP and repeat BMP after 1 week  # NIDDM T2 ,Held home medications during hospital stay, patient was covered with NovoLog sliding scale.  Resume home medication at discharge.  Patient was advised to monitor FSBG, diabetic diet and follow with PCP.   Body mass index is 30.41 kg/m.  Nutrition Interventions:  - Patient was instructed, not to drive, operate heavy machinery, perform activities at heights, swimming or participation in water activities or provide baby sitting services while on Pain, Sleep and Anxiety Medications; until her outpatient Physician  has advised to do so again.  - Also recommended to not to take more than prescribed Pain, Sleep and Anxiety Medications.  Patient was seen by physical therapy, who recommended Home health, which was arranged. On the day of the discharge the patient's vitals were stable, and no other acute  medical condition were reported by patient. the patient was felt safe to be discharge at Home with Home health.  Consultants: None Procedures: none  Discharge Exam: General: Appear in no distress, nio Rash; Oral Mucosa Clear, moist. Cardiovascular: S1 and S2 Present, no Murmur, Respiratory: normal respiratory effort, Bilateral Air entry present and mild Crackles, no wheezes Abdomen: Bowel Sound present, Soft and no tenderness, no hernia Extremities: no Pedal edema, no calf tenderness Neurology: alert and oriented to time, place, and person affect appropriate.  Filed Weights   02/10/21 0730  Weight: 90.7 kg   Vitals:   02/12/21 0404 02/12/21 0804  BP: (!) 115/55 132/79  Pulse: (!) 59 (!) 56  Resp: 16 16  Temp: 98 F (36.7 C) 97.7 F (36.5 C)  SpO2: 95% 98%    DISCHARGE MEDICATION: Allergies as of 02/12/2021       Reactions   Pravastatin Other (See Comments)   Other reaction(s): Dizziness, Muscle Pain   Codeine Other (See Comments)   Other reaction(s): Dizziness   Ibuprofen Other (See Comments)   Other reaction(s): Dizziness   Penicillins    Other reaction(s): Other (See Comments) Tongue and facial swelling Other reaction(s): Other (See Comments) Tongue and facial swelling   Sulfa Antibiotics Other (See Comments)   Lansoprazole Diarrhea        Medication List     TAKE these medications    ascorbic acid 500 MG tablet Commonly known as: VITAMIN C Take 1 tablet (500 mg total) by mouth daily. Start taking on: February 13, 2021   aspirin 81 MG EC tablet Take 81 mg by mouth daily.   atorvastatin 40 MG tablet Commonly known as: LIPITOR Take 40 mg by mouth daily.   colchicine 0.6 MG tablet Take 0.6 mg by mouth 2 (two) times daily as needed (acute gout flare).   glimepiride 4 MG tablet Commonly known as: AMARYL Take 4 mg by mouth daily.   guaiFENesin-dextromethorphan 100-10 MG/5ML syrup Commonly known as: ROBITUSSIN DM Take 10 mLs by mouth every 4 (four)  hours as needed for cough.   iron polysaccharides 150 MG capsule Commonly known as: NIFEREX Take 1 capsule (150 mg total) by mouth daily. Start taking on: February 13, 2021   linagliptin 5 MG Tabs tablet Commonly known as: TRADJENTA Take 5 mg by mouth daily.   lisinopril 40 MG tablet Commonly known as: ZESTRIL Take 40 mg by mouth daily.   pioglitazone 15 MG tablet Commonly known as: ACTOS Take 15 mg by mouth daily.   vitamin B-12 500 MCG tablet Commonly known as: CYANOCOBALAMIN Take 1 tablet (500 mcg total) by mouth daily. Start taking on: February 19, 2021   Vitamin D (Ergocalciferol) 1.25 MG (50000 UNIT) Caps capsule Commonly known as: DRISDOL Take 50,000 Units by mouth once a week.               Durable Medical Equipment  (From admission, onward)           Start     Ordered   02/12/21 1241  DME 3-in-1  Once        02/12/21 1242           Allergies  Allergen Reactions  Pravastatin Other (See Comments)    Other reaction(s): Dizziness, Muscle Pain   Codeine Other (See Comments)    Other reaction(s): Dizziness   Ibuprofen Other (See Comments)    Other reaction(s): Dizziness   Penicillins     Other reaction(s): Other (See Comments) Tongue and facial swelling Other reaction(s): Other (See Comments) Tongue and facial swelling    Sulfa Antibiotics Other (See Comments)   Lansoprazole Diarrhea   Discharge Instructions     Call MD for:  difficulty breathing, headache or visual disturbances   Complete by: As directed    Call MD for:  persistant dizziness or light-headedness   Complete by: As directed    Call MD for:  persistant nausea and vomiting   Complete by: As directed    Call MD for:  severe uncontrolled pain   Complete by: As directed    Call MD for:  temperature >100.4   Complete by: As directed    Diet - low sodium heart healthy   Complete by: As directed    Discharge instructions   Complete by: As directed    Follow with PCP in 1 week,  patient will need thyroid sonogram for thyroid nodule work-up as an outpatient.   Increase activity slowly   Complete by: As directed        The results of significant diagnostics from this hospitalization (including imaging, microbiology, ancillary and laboratory) are listed below for reference.    Significant Diagnostic Studies: CT Head Wo Contrast  Result Date: 02/10/2021 CLINICAL DATA:  Falls.  Weakness EXAM: CT HEAD WITHOUT CONTRAST CT CERVICAL SPINE WITHOUT CONTRAST TECHNIQUE: Multidetector CT imaging of the head and cervical spine was performed following the standard protocol without intravenous contrast. Multiplanar CT image reconstructions of the cervical spine were also generated. COMPARISON:  08/09/2013. FINDINGS: CT HEAD FINDINGS Brain: No evidence of acute large vascular territory infarction, hemorrhage, hydrocephalus, extra-axial collection or mass lesion/mass effect. Mild patchy white matter hypoattenuation, nonspecific but most likely related to chronic microvascular ischemic disease. Mild atrophy with ex vacuo ventricular dilation. Partially empty sella. Vascular: Calcific intracranial atherosclerosis. Skull: No acute fracture. Sinuses/Orbits: Mild pansinus mucosal thickening with small air-fluid levels in the left sphenoid sinus and left maxillary sinus. Other: No mastoid effusions. CT CERVICAL SPINE FINDINGS Alignment: Similar alignment. Similar straightening with mild anterolisthesis of C4 on C5. Skull base and vertebrae: No evidence of acute fracture. Vertebral body heights are maintained. Soft tissues and spinal canal: No prevertebral fluid or swelling. No visible canal hematoma. Disc levels: Multilevel degenerative disc disease, greatest at C5-C6 and C6-C7 with disc height loss and endplate spurring. Multilevel facet and uncovertebral hypertrophy with varying degrees of neural foraminal stenosis, greatest on the left at C3-C4. Upper chest: Not visualized. Other: Calcific  atherosclerosis bilateral carotid arteries. IMPRESSION: CT head: 1. No evidence of acute intracranial abnormality. 2. Mild chronic microvascular ischemic disease and atrophy. 3. Mild paranasal sinus disease, as described above. CT cervical spine: 1. No evidence of acute fracture or traumatic malalignment. 2. Moderate multilevel degenerative change, as detailed above. Electronically Signed   By: Margaretha Sheffield MD   On: 02/10/2021 09:29   CT CHEST WO CONTRAST  Result Date: 02/10/2021 CLINICAL DATA:  Pneumonia, COVID positive. EXAM: CT CHEST WITHOUT CONTRAST TECHNIQUE: Multidetector CT imaging of the chest was performed following the standard protocol without IV contrast. COMPARISON:  Chest radiograph 02/10/2021. FINDINGS: Cardiovascular: Atherosclerotic calcification of the aorta, aortic valve and coronary arteries. Heart is enlarged. No pericardial effusion. Mediastinum/Nodes: Mildly hypodense  nodules in the thyroid measure up to 1.8 cm on the right. No pathologically enlarged mediastinal or axillary lymph nodes. Hilar regions are difficult to definitively evaluate without IV contrast. Esophagus is grossly unremarkable. Small hiatal hernia. Lungs/Pleura: Image quality is degraded by respiratory motion and expiratory phase imaging. Peribronchovascular ground-glass and consolidation predominates in the posterior segment right upper lobe with involvement of the right lower lobe. Minimal peribronchovascular ground-glass in the lingula. Tiny bilateral pleural effusions. Airway is unremarkable. Upper Abdomen: Liver, gallbladder adrenal glands are unremarkable. Low-attenuation lesion in the right kidney measures 2.6 cm, incompletely visualized. Low-attenuation lesions off the left kidney measure up to 12 mm, too small to characterize. Visualized portions of the spleen, pancreas, stomach and bowel are unremarkable with the exception of a small hiatal hernia. No upper abdominal adenopathy. Musculoskeletal: Degenerative  changes in the spine. No worrisome lytic or sclerotic lesions. IMPRESSION: 1. Multilobar peribronchovascular ground-glass and consolidation may be due to COVID-19 pneumonia. 2. Tiny bilateral pleural effusions. 3. 1.8 cm right thyroid nodule. Recommend thyroid US. (Ref: J Am Coll Radiol. 2015 Feb;12(2): 143-50). 4. Aortic atherosclerosis (ICD10-I70.0). Coronary artery calcification. Electronically Signed   By: Lorin Picket M.D.   On: 02/10/2021 14:08   CT Cervical Spine Wo Contrast  Result Date: 02/10/2021 CLINICAL DATA:  Falls.  Weakness EXAM: CT HEAD WITHOUT CONTRAST CT CERVICAL SPINE WITHOUT CONTRAST TECHNIQUE: Multidetector CT imaging of the head and cervical spine was performed following the standard protocol without intravenous contrast. Multiplanar CT image reconstructions of the cervical spine were also generated. COMPARISON:  08/09/2013. FINDINGS: CT HEAD FINDINGS Brain: No evidence of acute large vascular territory infarction, hemorrhage, hydrocephalus, extra-axial collection or mass lesion/mass effect. Mild patchy white matter hypoattenuation, nonspecific but most likely related to chronic microvascular ischemic disease. Mild atrophy with ex vacuo ventricular dilation. Partially empty sella. Vascular: Calcific intracranial atherosclerosis. Skull: No acute fracture. Sinuses/Orbits: Mild pansinus mucosal thickening with small air-fluid levels in the left sphenoid sinus and left maxillary sinus. Other: No mastoid effusions. CT CERVICAL SPINE FINDINGS Alignment: Similar alignment. Similar straightening with mild anterolisthesis of C4 on C5. Skull base and vertebrae: No evidence of acute fracture. Vertebral body heights are maintained. Soft tissues and spinal canal: No prevertebral fluid or swelling. No visible canal hematoma. Disc levels: Multilevel degenerative disc disease, greatest at C5-C6 and C6-C7 with disc height loss and endplate spurring. Multilevel facet and uncovertebral hypertrophy with  varying degrees of neural foraminal stenosis, greatest on the left at C3-C4. Upper chest: Not visualized. Other: Calcific atherosclerosis bilateral carotid arteries. IMPRESSION: CT head: 1. No evidence of acute intracranial abnormality. 2. Mild chronic microvascular ischemic disease and atrophy. 3. Mild paranasal sinus disease, as described above. CT cervical spine: 1. No evidence of acute fracture or traumatic malalignment. 2. Moderate multilevel degenerative change, as detailed above. Electronically Signed   By: Margaretha Sheffield MD   On: 02/10/2021 09:29   DG Pelvis Portable  Result Date: 02/10/2021 CLINICAL DATA:  Fall EXAM: PORTABLE PELVIS 1-2 VIEWS COMPARISON:  None. FINDINGS: There is no evidence of displaced pelvic fracture or diastasis. No pelvic bone lesions are seen. IMPRESSION: No displaced fracture or dislocation of the pelvis or bilateral proximal femurs in single frontal view. Please note that plain radiographs are significantly insensitive for hip and pelvic fracture; recommend CT or MRI to most sensitively evaluate for fracture if suspected. Electronically Signed   By: Eddie Candle M.D.   On: 02/10/2021 08:22   DG Chest Portable 1 View  Result Date: 02/10/2021 CLINICAL  DATA:  Cough.  Weakness. EXAM: PORTABLE CHEST 1 VIEW COMPARISON:  08/09/2013. FINDINGS: Cardiomegaly. Lung volumes. Mild bilateral interstitial prominence. Mild interstitial edema and/or pneumonitis cannot be completely excluded. Tiny left pleural effusion cannot be excluded. No pneumothorax. IMPRESSION: Cardiomegaly. Low lung volumes. Mild bilateral interstitial prominence. Mild interstitial edema and/or pneumonitis cannot be excluded. Tiny left pleural effusion cannot be excluded. Electronically Signed   By: Marcello Moores  Register   On: 02/10/2021 08:22    Microbiology: Recent Results (from the past 240 hour(s))  Resp Panel by RT-PCR (Flu A&B, Covid) Nasopharyngeal Swab     Status: Abnormal   Collection Time: 02/10/21  7:38 AM    Specimen: Nasopharyngeal Swab; Nasopharyngeal(NP) swabs in vial transport medium  Result Value Ref Range Status   SARS Coronavirus 2 by RT PCR POSITIVE (A) NEGATIVE Final    Comment: RESULT CALLED TO, READ BACK BY AND VERIFIED WITH: C/JENNIFER GREGORY 02/10/21 @ 0910 BY SJL/S BEARD (NOTE) SARS-CoV-2 target nucleic acids are DETECTED.  The SARS-CoV-2 RNA is generally detectable in upper respiratory specimens during the acute phase of infection. Positive results are indicative of the presence of the identified virus, but do not rule out bacterial infection or co-infection with other pathogens not detected by the test. Clinical correlation with patient history and other diagnostic information is necessary to determine patient infection status. The expected result is Negative.  Fact Sheet for Patients: EntrepreneurPulse.com.au  Fact Sheet for Healthcare Providers: IncredibleEmployment.be  This test is not yet approved or cleared by the [place] FDA and  has been authorized for detection and/or diagnosis of SARS-CoV-2 by FDA under an Emergency Use Authorization (EUA).  This EUA will remain in effect (meaning th is test can be used) for the duration of  the COVID-19 declaration under Section 564(b)(1) of the Act, 21 U.S.C. section 360bbb-3(b)(1), unless the authorization is terminated or revoked sooner.     Influenza A by PCR NEGATIVE NEGATIVE Final   Influenza B by PCR NEGATIVE NEGATIVE Final    Comment: (NOTE) The Xpert Xpress SARS-CoV-2/FLU/RSV plus assay is intended as an aid in the diagnosis of influenza from Nasopharyngeal swab specimens and should not be used as a sole basis for treatment. Nasal washings and aspirates are unacceptable for Xpert Xpress SARS-CoV-2/FLU/RSV testing.  Fact Sheet for Patients: EntrepreneurPulse.com.au  Fact Sheet for Healthcare Providers: IncredibleEmployment.be  This  test is not yet approved or cleared by the [place] FDA and has been authorized for detection and/or diagnosis of SARS-CoV-2 by FDA under an Emergency Use Authorization (EUA). This EUA will remain in effect (meaning this test can be used) for the duration of the COVID-19 declaration under Section 564(b)(1) of the Act, 21 U.S.C. section 360bbb-3(b)(1), unless the authorization is terminated or revoked.  Performed at [facility] [place], [address] [place], [place] [address]      Labs: CBC: Recent Labs  Lab 02/10/21 [phone] 02/11/21 0509 02/12/21 0630  WBC 10.7* 7.4 6.4  NEUTROABS 8.4* 4.4 4.4  HGB 11.9* 12.3 12.4  HCT 37.2 37.3 37.5  MCV 94.7 91.6 91.0  PLT 207 208 130   Basic Metabolic Panel: Recent Labs  Lab 02/10/21 0904 02/11/21 0509 02/12/21 0630  NA 137 139 135  K 4.7 4.3 4.6  CL 109 110 109  CO2 22 23 23   GLUCOSE 127* 81 302*  BUN 36* 42* 50*  CREATININE 1.71* 1.83* 1.89*  CALCIUM 9.1 9.3 9.5  MG 2.0 2.0 2.1  PHOS  --  3.1 3.4   Liver  Function Tests: Recent Labs  Lab 02/10/21 0904 02/11/21 0509 02/12/21 0630  AST 27 30 29   ALT 15 15 16   ALKPHOS 60 58 63  BILITOT 0.5 0.5 0.6  PROT 6.1* 6.4* 6.3*  ALBUMIN 3.1* 3.2* 3.1*   No results for input(s): LIPASE, AMYLASE in the last 168 hours. No results for input(s): AMMONIA in the last 168 hours. Cardiac Enzymes: Recent Labs  Lab 02/10/21 1212 02/12/21 0630  CKTOTAL 544* 290*   BNP (last 3 results) Recent Labs    02/10/21 0738  BNP 583.2*   CBG: Recent Labs  Lab 02/11/21 1637 02/11/21 2031 02/12/21 0805 02/12/21 1200  GLUCAP 218* 357* 217* 234*    Time spent: 35 minutes  Signed:  Val Riles  [facility]  02/12/2021 12:42 PM

## 2021-02-12 NOTE — Progress Notes (Signed)
*  PRELIMINARY RESULTS* Echocardiogram 2D Echocardiogram has been performed.  Amber Fields Amber Fields 02/12/2021, 11:06 AM

## 2021-02-12 NOTE — TOC Initial Note (Signed)
Transition of Care [facility]) - Initial/Assessment Note    Patient Details  Name: Amber Fields MRN: [id number] Date of Birth: 1932-08-12  [facility] CM/SW Contact:    Beverly Sessions, RN Phone Number: 02/12/2021, 2:31 PM  Clinical Narrative:                 Patient to discharge home today  Patient lives at home with daughter Daughter will be transporting at discharge   PCP Raechel Ache - daughter transports Denies issues obtaining medications  PT has assessed patient and recommends home health PT.  Patient agreeable and states she does not have a preference of home health agency.  Referral made to [facility] with [place]   [facility] and RW to be delivered to room prior to discharge   Expected Discharge Plan: [place] Barriers to Discharge: No Barriers Identified   Patient Goals and CMS Choice        Expected Discharge Plan and Services Expected Discharge Plan: [place]   Discharge Planning Services: CM Consult     Expected Discharge Date: 02/12/21                         HH Arranged: RN, Nurse's Aide, OT, PT [facility] Agency: [place] Date [facility] Agency Contacted: 02/12/21   Representative spoke with at [place] [facility]  Prior Living Arrangements/Services   Lives with:: Adult Children Patient language and need for interpreter reviewed:: Yes Do you feel safe going back to the place where you live?: Yes      Need for Family Participation in Patient Care: Yes (Comment) Care giver support system in place?: Yes (comment)   Criminal Activity/Legal Involvement Pertinent to Current Situation/Hospitalization: No - Comment as needed  Activities of Daily Living Home Assistive Devices/Equipment: None ADL Screening (condition at time of admission) Patient's cognitive ability adequate to safely complete daily activities?: Yes Is the patient deaf or have difficulty hearing?: Yes Does the patient have  difficulty seeing, even when wearing glasses/contacts?: No Does the patient have difficulty concentrating, remembering, or making decisions?: No Patient able to express need for assistance with ADLs?: Yes Does the patient have difficulty dressing or bathing?: No Independently performs ADLs?: Yes (appropriate for developmental age) Does the patient have difficulty walking or climbing stairs?: Yes Weakness of Legs: Both Weakness of Arms/Hands: None  Permission Sought/Granted                  Emotional Assessment       Orientation: : Oriented to Self, Oriented to Place, Oriented to  Time, Oriented to Situation Alcohol / Substance Use: Not Applicable Psych Involvement: No (comment)  Admission diagnosis:  Cough [R05.9] Weakness [R53.1] Recurrent falls [R29.6] COVID [U07.1] COVID-19 virus infection [U07.1] Patient Active Problem List   Diagnosis Date Noted   COVID-19 virus infection 02/10/2021   Weakness 02/10/2021   Fall at home, initial encounter 02/10/2021   PCP:  Ezequiel Kayser, MD Pharmacy:   [facility] [place], [address] [place] [address] Phone: [phone] Fax: [phone]     Social Determinants of Health (SDOH) Interventions    Readmission Risk Interventions No flowsheet data found.

## 2021-02-12 NOTE — Evaluation (Signed)
Occupational Therapy Evaluation Patient Details Name: Amber Fields MRN: [id number] DOB: January 19, 1933 Today's Date: 02/12/2021    History of Present Illness Patient is a 87 year old female who presents to the ER via EMS for evaluation of weakness and frequent falls at home. COVID +. 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 Prior to hospital admission, pt was Independent in all aspects of ADL and mobility. Pt lives with children, grandchildren, and great-grandchild in 1 level home c 8 STE. Currently pt demonstrates near baseline independence to perform ADL and mobility tasks. Pt requires SBA + HHA with no LOBs noted for bed>chair t/f. No skilled acute OT needs identified. Will sign off. Please re-consult if additional OT needs arise.     Follow Up Recommendations  No OT follow up    Equipment Recommendations  None recommended by OT    Recommendations for Other Services       Precautions / Restrictions Precautions Precautions: Fall Restrictions Weight Bearing Restrictions: No      Mobility Bed Mobility Overal bed mobility: Needs Assistance Bed Mobility: Supine to Sit     Supine to sit: Supervision          Transfers Overall transfer level: Needs assistance Equipment used: 1 person hand held assist Transfers: Sit to/from Stand Sit to Stand: Min guard              Balance Overall balance assessment: Needs assistance Sitting-balance support: Feet supported Sitting balance-Leahy Scale: Good     Standing balance support: Single extremity supported;During functional activity Standing balance-Leahy Scale: Fair                             ADL either performed or assessed with clinical judgement   ADL Overall ADL's : Needs assistance/impaired                                        General ADL Comments: MOD I for LBD/UBD seated EOB. SBA + HHA for ADL t/f.      Pertinent Vitals/Pain Pain Assessment: No/denies pain     Hand Dominance     Extremity/Trunk Assessment Upper Extremity Assessment Upper Extremity Assessment: Overall WFL for tasks assessed   Lower Extremity Assessment Lower Extremity Assessment: Generalized weakness       Communication Communication Communication: No difficulties   Cognition Arousal/Alertness: Awake/alert Behavior During Therapy: WFL for tasks assessed/performed Overall Cognitive Status: Within Functional Limits for tasks assessed                                 General Comments: patient able to follow single step commands without difficulty   General Comments       Exercises Exercises: Other exercises Other Exercises Other Exercises: Pt educated re: OT role, DME recs, d/c recs, falls prevention, ECS, home/routines modifications Other Exercises: LBD, sup>sit, sit<>stand, SPT, sitting/standing balance/tolerance   Shoulder Instructions      Home Living Family/patient expects to be discharged to:: Private residence Living Arrangements: Children;Other relatives Available Help at Discharge: Family;Available PRN/intermittently Type of Home: Mobile home Home Access: Stairs to enter Entrance Stairs-Number of Steps: 8   Home Layout: One level  Bathroom Shower/Tub: Aeronautical engineer: Shower seat          Prior Functioning/Environment Level of Independence: Independent        Comments: patient reports 4 falls in the past month        OT Problem List: Decreased activity tolerance         OT Goals(Current goals can be found in the care plan section) Acute Rehab OT Goals Patient Stated Goal: to go home OT Goal Formulation: With patient Time For Goal Achievement: 02/26/21 Potential to Achieve Goals: Good   AM-PAC OT "6 Clicks" Daily Activity     Outcome Measure Help  from another person eating meals?: None Help from another person taking care of personal grooming?: None Help from another person toileting, which includes using toliet, bedpan, or urinal?: None Help from another person bathing (including washing, rinsing, drying)?: A Little Help from another person to put on and taking off regular upper body clothing?: None Help from another person to put on and taking off regular lower body clothing?: None 6 Click Score: 23   End of Session    Activity Tolerance: Patient tolerated treatment well Patient left: in chair;with call bell/phone within reach;with chair alarm set  OT Visit Diagnosis: Other abnormalities of gait and mobility (R26.89)                Time: 1287-8676 OT Time Calculation (min): 12 min Charges:  OT General Charges $OT Visit: 1 Visit OT Evaluation $OT Eval Low Complexity: 1 Low  Dessie Coma, M.S. OTR/L  02/12/21, 1:22 PM  ascom [phone]

## 2021-02-22 ENCOUNTER — Ambulatory Visit: Payer: Medicare Other

## 2021-02-23 ENCOUNTER — Ambulatory Visit: Admit: 2021-02-23 | Payer: Medicare Other | Admitting: Ophthalmology

## 2021-02-23 SURGERY — PHACOEMULSIFICATION, CATARACT, WITH IOL INSERTION
Anesthesia: Topical | Laterality: Right

## 2021-03-09 ENCOUNTER — Ambulatory Visit: Admit: 2021-03-09 | Payer: Medicare Other | Admitting: Ophthalmology

## 2021-03-09 SURGERY — PHACOEMULSIFICATION, CATARACT, WITH IOL INSERTION
Anesthesia: Topical | Laterality: Left

## 2021-04-16 ENCOUNTER — Encounter: Payer: Self-pay | Admitting: Ophthalmology

## 2021-04-26 NOTE — Discharge Instructions (Signed)

## 2021-04-27 ENCOUNTER — Encounter
Admission: RE | Disposition: A | Discharge status: Home / Self Care | Payer: Self-pay | Source: Home / Self Care | Attending: Ophthalmology

## 2021-04-27 ENCOUNTER — Ambulatory Visit: Payer: Medicare Other | Admitting: Anesthesiology

## 2021-04-27 ENCOUNTER — Other Ambulatory Visit: Payer: Self-pay

## 2021-04-27 ENCOUNTER — Ambulatory Visit
Admission: RE | Admit: 2021-04-27 | Discharge: 2021-04-27 | Disposition: A | Discharge status: Home / Self Care | Payer: Medicare Other | Attending: Ophthalmology | Admitting: Ophthalmology

## 2021-04-27 DIAGNOSIS — H2511 Age-related nuclear cataract, right eye: Secondary | ICD-10-CM | POA: Insufficient documentation

## 2021-04-27 DIAGNOSIS — Z79899 Other long term (current) drug therapy: Secondary | ICD-10-CM | POA: Insufficient documentation

## 2021-04-27 DIAGNOSIS — Z881 Allergy status to other antibiotic agents status: Secondary | ICD-10-CM | POA: Diagnosis not present

## 2021-04-27 DIAGNOSIS — Z885 Allergy status to narcotic agent status: Secondary | ICD-10-CM | POA: Diagnosis not present

## 2021-04-27 DIAGNOSIS — Z888 Allergy status to other drugs, medicaments and biological substances status: Secondary | ICD-10-CM | POA: Diagnosis not present

## 2021-04-27 DIAGNOSIS — Z880 Allergy status to penicillin: Secondary | ICD-10-CM | POA: Insufficient documentation

## 2021-04-27 DIAGNOSIS — Z7982 Long term (current) use of aspirin: Secondary | ICD-10-CM | POA: Insufficient documentation

## 2021-04-27 HISTORY — DX: Transient cerebral ischemic attack, unspecified: G45.9

## 2021-04-27 HISTORY — PX: CATARACT EXTRACTION W/PHACO: SHX586

## 2021-04-27 HISTORY — DX: Type 2 diabetes mellitus without complications: E11.9

## 2021-04-27 HISTORY — DX: Essential (primary) hypertension: I10

## 2021-04-27 HISTORY — DX: Hyperparathyroidism, unspecified: E21.3

## 2021-04-27 HISTORY — DX: Chronic kidney disease, unspecified: N18.9

## 2021-04-27 LAB — GLUCOSE, CAPILLARY
Glucose-Capillary: 72 mg/dL (ref 70–99)
Glucose-Capillary: 77 mg/dL (ref 70–99)
Glucose-Capillary: 90 mg/dL (ref 70–99)

## 2021-04-27 SURGERY — PHACOEMULSIFICATION, CATARACT, WITH IOL INSERTION
Anesthesia: Monitor Anesthesia Care | Site: Eye | Laterality: Right

## 2021-04-27 MED ORDER — SIGHTPATH DOSE#1 NA CHONDROIT SULF-NA HYALURON 40-17 MG/ML IO SOLN
INTRAOCULAR | Status: DC | PRN
  Administered 2021-04-27: 1 mL via INTRAOCULAR

## 2021-04-27 MED ORDER — BRIMONIDINE TARTRATE-TIMOLOL 0.2-0.5 % OP SOLN
OPHTHALMIC | Status: DC | PRN
  Administered 2021-04-27: 1 [drp] via OPHTHALMIC

## 2021-04-27 MED ORDER — ONDANSETRON HCL 4 MG/2ML IJ SOLN
INTRAMUSCULAR | Status: DC | PRN
  Administered 2021-04-27: 4 mg via INTRAVENOUS

## 2021-04-27 MED ORDER — SIGHTPATH DOSE#1 BSS IO SOLN
INTRAOCULAR | Status: DC | PRN
  Administered 2021-04-27: 15 mL

## 2021-04-27 MED ORDER — ARMC OPHTHALMIC DILATING DROPS
1.0000 "application " | OPHTHALMIC | Status: DC | PRN
  Administered 2021-04-27 (×3): 1 via OPHTHALMIC

## 2021-04-27 MED ORDER — MOXIFLOXACIN HCL 0.5 % OP SOLN
OPHTHALMIC | Status: DC | PRN
  Administered 2021-04-27: 0.2 mL via OPHTHALMIC

## 2021-04-27 MED ORDER — SIGHTPATH DOSE#1 BSS IO SOLN
INTRAOCULAR | Status: DC | PRN
  Administered 2021-04-27: 59 mL via OPHTHALMIC

## 2021-04-27 MED ORDER — TETRACAINE HCL 0.5 % OP SOLN
1.0000 [drp] | OPHTHALMIC | Status: DC | PRN
  Administered 2021-04-27 (×3): 1 [drp] via OPHTHALMIC

## 2021-04-27 MED ORDER — FENTANYL CITRATE (PF) 100 MCG/2ML IJ SOLN
INTRAMUSCULAR | Status: DC | PRN
  Administered 2021-04-27: 25 ug via INTRAVENOUS
  Administered 2021-04-27: 50 ug via INTRAVENOUS
  Administered 2021-04-27: 25 ug via INTRAVENOUS

## 2021-04-27 MED ORDER — ACETAMINOPHEN 160 MG/5ML PO SOLN: 325.0000 mg | Freq: Once | ORAL | Status: DC

## 2021-04-27 MED ORDER — ACETAMINOPHEN 325 MG PO TABS: 325.0000 mg | ORAL_TABLET | Freq: Once | ORAL | Status: DC

## 2021-04-27 MED ORDER — LACTATED RINGERS IV SOLN: INTRAVENOUS | Status: DC

## 2021-04-27 MED ORDER — SIGHTPATH DOSE#1 BSS IO SOLN
INTRAOCULAR | Status: DC | PRN
  Administered 2021-04-27: 1 mL

## 2021-04-27 SURGICAL SUPPLY — 14 items
CANNULA ANT/CHMB 27GA (MISCELLANEOUS) ×4 IMPLANT
GLOVE SURG ENC TEXT LTX SZ8 (GLOVE) ×2 IMPLANT
GLOVE SURG TRIUMPH 8.0 PF LTX (GLOVE) ×2 IMPLANT
GOWN STRL REUS W/ TWL LRG LVL3 (GOWN DISPOSABLE) ×2 IMPLANT
GOWN STRL REUS W/TWL LRG LVL3 (GOWN DISPOSABLE) ×4
LENS IOL TECNIS EYHANCE 20.5 (Intraocular Lens) ×2 IMPLANT
MARKER SKIN DUAL TIP RULER LAB (MISCELLANEOUS) ×2 IMPLANT
NEEDLE FILTER BLUNT 18X 1/2SAF (NEEDLE) ×1
NEEDLE FILTER BLUNT 18X1 1/2 (NEEDLE) ×1 IMPLANT
PACK EYE AFTER SURG (MISCELLANEOUS) ×2 IMPLANT
SYR 3ML LL SCALE MARK (SYRINGE) ×2 IMPLANT
SYR TB 1ML LUER SLIP (SYRINGE) ×2 IMPLANT
WATER STERILE IRR 250ML POUR (IV SOLUTION) ×2 IMPLANT
WIPE NON LINTING 3.25X3.25 (MISCELLANEOUS) ×2 IMPLANT

## 2021-04-27 NOTE — Anesthesia Procedure Notes (Signed)
Procedure Name: MAC Date/Time: 04/27/2021 9:01 AM Performed by: Jeannene Patella, CRNA Pre-anesthesia Checklist: Patient identified, Emergency Drugs available, Suction available, Timeout performed and Patient being monitored Patient Re-evaluated:Patient Re-evaluated prior to induction Oxygen Delivery Method: Nasal cannula Placement Confirmation: positive ETCO2

## 2021-04-27 NOTE — Anesthesia Preprocedure Evaluation (Signed)
Anesthesia Evaluation  Patient identified by MRN, date of birth, ID band Patient awake    Reviewed: Allergy & Precautions, H&P , NPO status , Patient's Chart, lab work & pertinent test results  Airway Mallampati: II  TM Distance: >3 FB Neck ROM: full    Dental no notable dental hx. (+) Upper Dentures, Lower Dentures   Pulmonary    Pulmonary exam normal breath sounds clear to auscultation       Cardiovascular hypertension, Normal cardiovascular exam Rhythm:regular Rate:Normal  TIA   Neuro/Psych    GI/Hepatic   Endo/Other  diabetes, Type 2Hyperparathyroid  Renal/GU Renal disease     Musculoskeletal   Abdominal   Peds  Hematology   Anesthesia Other Findings   Reproductive/Obstetrics                             Anesthesia Physical Anesthesia Plan  ASA: 3  Anesthesia Plan: MAC   Post-op Pain Management:    Induction:   PONV Risk Score and Plan: 2 and Treatment may vary due to age or medical condition, Midazolam and TIVA  Airway Management Planned:   Additional Equipment:   Intra-op Plan:   Post-operative Plan:   Informed Consent: I have reviewed the patients History and Physical, chart, labs and discussed the procedure including the risks, benefits and alternatives for the proposed anesthesia with the patient or authorized representative who has indicated his/her understanding and acceptance.     Dental Advisory Given  Plan Discussed with: CRNA  Anesthesia Plan Comments:         Anesthesia Quick Evaluation

## 2021-04-27 NOTE — H&P (Signed)
[place]   Primary Care Physician:  [facility] Ophthalmologist: Dr. Hortense Ramal  Pre-Procedure History & Physical: HPI:  Amber Fields is a 87 y.o. female here for cataract surgery.   Past Medical History:  Diagnosis Date   Cancer ([place])    skin ca   Chronic kidney disease    Diabetes mellitus without complication ([place])    Hyperparathyroidism ([place])    Hypertension    Transient cerebral ischemia     Past Surgical History:  Procedure Laterality Date   ABDOMINAL HYSTERECTOMY      Prior to Admission medications   Medication Sig Start Date End Date Taking? Authorizing Provider  acetaminophen (TYLENOL) 500 MG tablet Take 500 mg by mouth every 6 (six) hours as needed.   Yes [provider]  ascorbic acid (VITAMIN C) 500 MG tablet Take 1 tablet (500 mg total) by mouth daily. 02/13/21 05/14/21 Yes Val Riles, MD  aspirin 81 MG EC tablet Take 81 mg by mouth daily.   Yes [provider]  atorvastatin (LIPITOR) 40 MG tablet Take 40 mg by mouth daily.   Yes [provider]  glimepiride (AMARYL) 4 MG tablet Take 4 mg by mouth daily.   Yes [provider]  guaiFENesin-dextromethorphan (ROBITUSSIN DM) 100-10 MG/5ML syrup Take 10 mLs by mouth every 4 (four) hours as needed for cough. 02/12/21  Yes Val Riles, MD  iron polysaccharides (NIFEREX) 150 MG capsule Take 1 capsule (150 mg total) by mouth daily. 02/13/21 05/14/21 Yes Val Riles, MD  linagliptin (TRADJENTA) 5 MG TABS tablet Take 5 mg by mouth daily.   Yes [provider]  lisinopril (ZESTRIL) 40 MG tablet Take 40 mg by mouth daily.   Yes [provider]  pioglitazone (ACTOS) 15 MG tablet Take 15 mg by mouth daily.   Yes [provider]  Vitamin D, Ergocalciferol, (DRISDOL) 1.25 MG (50000 UNIT) CAPS capsule Take 50,000 Units by mouth once a week.   Yes [provider]  colchicine 0.6 MG tablet Take 0.6 mg by mouth 2 (two) times daily as needed  (acute gout flare).    [provider]  vitamin B-12 (CYANOCOBALAMIN) 500 MCG tablet Take 1 tablet (500 mcg total) by mouth daily. Patient not taking: Reported on 04/16/2021 02/19/21 05/20/21  Val Riles, MD    Allergies as of 03/24/2021 - Review Complete 02/10/2021  Allergen Reaction Noted   Pravastatin Other (See Comments) 05/25/2014   Codeine Other (See Comments) 05/26/2014   Ibuprofen Other (See Comments) 03/20/2018   Penicillins  05/25/2014   Sulfa antibiotics Other (See Comments) 05/21/2014   Lansoprazole Diarrhea 05/21/2014    Family History  Problem Relation Age of Onset   Breast cancer Cousin        mat cousin    Social History   Socioeconomic History   Marital status: Widowed    Spouse name: Not on file   Number of children: Not on file   Years of education: Not on file   Highest education level: Not on file  Occupational History   Not on file  Tobacco Use   Smoking status: Never   Smokeless tobacco: Never  Substance and Sexual Activity   Alcohol use: Not on file   Drug use: Not on file   Sexual activity: Not on file  Other Topics Concern   Not on file  Social History Narrative   Not on file   Social Determinants of Health   Financial Resource Strain: Not on file  Food Insecurity: Not on file  Transportation Needs: Not on file  Physical Activity: Not on file  Stress: Not on file  Social Connections: Not on file  Intimate Partner Violence: Not on file    Review of Systems: See HPI, otherwise negative ROS  Physical Exam: BP 140/65   Pulse 60   Temp (!) 97.2 F (36.2 C) (Temporal)   Resp 18   Ht 5\' 8"  (1.727 m)   Wt 93 kg   SpO2 100%   BMI 31.17 kg/m  General:   Alert, cooperative in NAD Head:  Normocephalic and atraumatic. Respiratory:  Normal work of breathing. Cardiovascular:  RRR  Impression/Plan: Amber Fields is here for cataract surgery.  Risks, benefits, limitations, and alternatives regarding cataract  surgery have been reviewed with the patient.  Questions have been answered.  All parties agreeable.   Birder Robson, MD  04/27/2021, 8:51 AM

## 2021-04-27 NOTE — Op Note (Signed)
PREOPERATIVE DIAGNOSIS:  Nuclear sclerotic cataract of the right eye.   POSTOPERATIVE DIAGNOSIS:  H25.11 Cataract   OPERATIVE PROCEDURE:ORPROCALL@   SURGEON:  Birder Robson, MD.   ANESTHESIA:  Anesthesiologist: Ronelle Nigh, MD CRNA: Jeannene Patella, CRNA  1.      Managed anesthesia care. 2.      0.94ml of Shugarcaine was instilled in the eye following the paracentesis.   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 A paracentesis was created with the side port blade and the anterior chamber was filled with viscoelastic. A near clear corneal incision was performed with the steel keratome. A continuous curvilinear capsulorrhexis was performed with a cystotome followed by the capsulorrhexis forceps. Hydrodissection and hydrodelineation were carried out with BSS on a blunt cannula. The lens was removed in a stop and chop  technique and the remaining cortical material was removed with the irrigation-aspiration handpiece. The capsular bag was inflated with viscoelastic and the Technis [id number]  lens was placed in the capsular bag without complication. The remaining viscoelastic was removed from the eye with the irrigation-aspiration handpiece. The wounds were hydrated. The anterior chamber was flushed with BSS and the eye was inflated to physiologic pressure. 0.54ml of Vigamox was placed in the anterior chamber. The wounds were found to be water tight. The eye was dressed with Combigan. The patient was given protective glasses to wear throughout the day and a shield with which to sleep tonight. The patient was also  given drops with which to begin a drop regimen today and will follow-up with me in one day. Implant Name Type Inv. Item Serial No. Manufacturer Lot No. LRB No. Used Action  LENS IOL TECNIS EYHANCE 20.5 - [id number] Intraocular Lens LENS IOL TECNIS EYHANCE 20.5 [id number] JOHNSON   Right 1 Implanted   Procedure(s) with comments: CATARACT EXTRACTION PHACO AND INTRAOCULAR LENS PLACEMENT (IOC) RIGHT DIABETIC OMIDRIA 8.64 00:52.6 (Right) - covid + 02-10-21  Electronically signed: Birder Robson 04/27/2021 9:17 AM

## 2021-04-27 NOTE — Anesthesia Postprocedure Evaluation (Signed)
Anesthesia Post Note  Patient: Amber Fields  Procedure(s) Performed: CATARACT EXTRACTION PHACO AND INTRAOCULAR LENS PLACEMENT (IOC) RIGHT DIABETIC OMIDRIA 8.64 00:52.6 (Right: Eye)     Patient location during evaluation: PACU Anesthesia Type: MAC Level of consciousness: awake and alert and oriented Pain management: satisfactory to patient Vital Signs Assessment: post-procedure vital signs reviewed and stable Respiratory status: spontaneous breathing, nonlabored ventilation and respiratory function stable Cardiovascular status: blood pressure returned to baseline and stable Postop Assessment: Adequate PO intake and No signs of nausea or vomiting Anesthetic complications: no   No notable events documented.  Raliegh Ip

## 2021-04-27 NOTE — Transfer of Care (Signed)
Immediate Anesthesia Transfer of Care Note  Patient: Amber Fields  Procedure(s) Performed: CATARACT EXTRACTION PHACO AND INTRAOCULAR LENS PLACEMENT (IOC) RIGHT DIABETIC OMIDRIA 8.64 00:52.6 (Right: Eye)  Patient Location: PACU  Anesthesia Type: MAC  Level of Consciousness: awake, alert  and patient cooperative  Airway and Oxygen Therapy: Patient Spontanous Breathing and Patient connected to supplemental oxygen  Post-op Assessment: Post-op Vital signs reviewed, Patient's Cardiovascular Status Stable, Respiratory Function Stable, Patent Airway and No signs of Nausea or vomiting  Post-op Vital Signs: Reviewed and stable  Complications: No notable events documented.

## 2021-04-28 ENCOUNTER — Encounter: Payer: Self-pay | Admitting: Ophthalmology

## 2021-05-06 NOTE — Discharge Instructions (Signed)

## 2021-05-11 ENCOUNTER — Ambulatory Visit: Payer: Medicare Other | Admitting: Anesthesiology

## 2021-05-11 ENCOUNTER — Ambulatory Visit
Admission: RE | Admit: 2021-05-11 | Discharge: 2021-05-11 | Disposition: A | Discharge status: Home / Self Care | Payer: Medicare Other | Attending: Ophthalmology | Admitting: Ophthalmology

## 2021-05-11 ENCOUNTER — Encounter: Payer: Self-pay | Admitting: Ophthalmology

## 2021-05-11 ENCOUNTER — Other Ambulatory Visit: Payer: Self-pay

## 2021-05-11 ENCOUNTER — Encounter
Admission: RE | Disposition: A | Discharge status: Home / Self Care | Payer: Self-pay | Source: Home / Self Care | Attending: Ophthalmology

## 2021-05-11 DIAGNOSIS — Z885 Allergy status to narcotic agent status: Secondary | ICD-10-CM | POA: Insufficient documentation

## 2021-05-11 DIAGNOSIS — N189 Chronic kidney disease, unspecified: Secondary | ICD-10-CM | POA: Insufficient documentation

## 2021-05-11 DIAGNOSIS — E1136 Type 2 diabetes mellitus with diabetic cataract: Secondary | ICD-10-CM | POA: Insufficient documentation

## 2021-05-11 DIAGNOSIS — Z7982 Long term (current) use of aspirin: Secondary | ICD-10-CM | POA: Diagnosis not present

## 2021-05-11 DIAGNOSIS — I129 Hypertensive chronic kidney disease with stage 1 through stage 4 chronic kidney disease, or unspecified chronic kidney disease: Secondary | ICD-10-CM | POA: Insufficient documentation

## 2021-05-11 DIAGNOSIS — E1122 Type 2 diabetes mellitus with diabetic chronic kidney disease: Secondary | ICD-10-CM | POA: Insufficient documentation

## 2021-05-11 DIAGNOSIS — Z79899 Other long term (current) drug therapy: Secondary | ICD-10-CM | POA: Diagnosis not present

## 2021-05-11 DIAGNOSIS — Z888 Allergy status to other drugs, medicaments and biological substances status: Secondary | ICD-10-CM | POA: Diagnosis not present

## 2021-05-11 DIAGNOSIS — Z7984 Long term (current) use of oral hypoglycemic drugs: Secondary | ICD-10-CM | POA: Diagnosis not present

## 2021-05-11 DIAGNOSIS — H2512 Age-related nuclear cataract, left eye: Secondary | ICD-10-CM | POA: Diagnosis not present

## 2021-05-11 DIAGNOSIS — Z8673 Personal history of transient ischemic attack (TIA), and cerebral infarction without residual deficits: Secondary | ICD-10-CM | POA: Diagnosis not present

## 2021-05-11 HISTORY — PX: CATARACT EXTRACTION W/PHACO: SHX586

## 2021-05-11 LAB — GLUCOSE, CAPILLARY
Glucose-Capillary: 89 mg/dL (ref 70–99)
Glucose-Capillary: 119 mg/dL — ABNORMAL HIGH (ref 70–99)

## 2021-05-11 SURGERY — PHACOEMULSIFICATION, CATARACT, WITH IOL INSERTION
Anesthesia: Monitor Anesthesia Care | Site: Eye | Laterality: Left

## 2021-05-11 MED ORDER — TETRACAINE HCL 0.5 % OP SOLN
1.0000 [drp] | OPHTHALMIC | Status: DC | PRN
  Administered 2021-05-11 (×3): 1 [drp] via OPHTHALMIC

## 2021-05-11 MED ORDER — ARMC OPHTHALMIC DILATING DROPS
1.0000 "application " | OPHTHALMIC | Status: DC | PRN
  Administered 2021-05-11 (×3): 1 via OPHTHALMIC

## 2021-05-11 MED ORDER — BRIMONIDINE TARTRATE-TIMOLOL 0.2-0.5 % OP SOLN
OPHTHALMIC | Status: DC | PRN
  Administered 2021-05-11: 1 [drp] via OPHTHALMIC

## 2021-05-11 MED ORDER — ONDANSETRON HCL 4 MG/2ML IJ SOLN
4.0000 mg | Freq: Once | INTRAMUSCULAR | Status: AC | PRN
Start: 2021-05-11 — End: 2021-05-11
  Administered 2021-05-11: 4 mg via INTRAVENOUS

## 2021-05-11 MED ORDER — MOXIFLOXACIN HCL 0.5 % OP SOLN
OPHTHALMIC | Status: DC | PRN
  Administered 2021-05-11: 0.2 mL via OPHTHALMIC

## 2021-05-11 MED ORDER — PHENYLEPHRINE-KETOROLAC 1-0.3 % IO SOLN
INTRAOCULAR | Status: DC | PRN
  Administered 2021-05-11: 130 mL via OPHTHALMIC

## 2021-05-11 MED ORDER — SIGHTPATH DOSE#1 BSS IO SOLN
INTRAOCULAR | Status: DC | PRN
  Administered 2021-05-11: 15 mL

## 2021-05-11 MED ORDER — SIGHTPATH DOSE#1 BSS IO SOLN
INTRAOCULAR | Status: DC | PRN
  Administered 2021-05-11: 1 mL via INTRAMUSCULAR

## 2021-05-11 MED ORDER — ACETAMINOPHEN 160 MG/5ML PO SOLN: 975.0000 mg | Freq: Once | ORAL | Status: DC | PRN

## 2021-05-11 MED ORDER — ACETAMINOPHEN 500 MG PO TABS: 1000.0000 mg | ORAL_TABLET | Freq: Once | ORAL | Status: DC | PRN

## 2021-05-11 MED ORDER — LACTATED RINGERS IV SOLN: INTRAVENOUS | Status: DC

## 2021-05-11 MED ORDER — SIGHTPATH DOSE#1 NA CHONDROIT SULF-NA HYALURON 40-17 MG/ML IO SOLN
INTRAOCULAR | Status: DC | PRN
  Administered 2021-05-11: 1 mL via INTRAOCULAR

## 2021-05-11 MED ORDER — FENTANYL CITRATE (PF) 100 MCG/2ML IJ SOLN
INTRAMUSCULAR | Status: DC | PRN
  Administered 2021-05-11: 100 ug via INTRAVENOUS

## 2021-05-11 SURGICAL SUPPLY — 14 items
CANNULA ANT/CHMB 27GA (MISCELLANEOUS) IMPLANT
GLOVE SURG ENC TEXT LTX SZ8 (GLOVE) ×2 IMPLANT
GLOVE SURG TRIUMPH 8.0 PF LTX (GLOVE) ×2 IMPLANT
GOWN STRL REUS W/ TWL LRG LVL3 (GOWN DISPOSABLE) ×2 IMPLANT
GOWN STRL REUS W/TWL LRG LVL3 (GOWN DISPOSABLE) ×4
LENS IOL TECNIS EYHANCE 20.5 (Intraocular Lens) ×2 IMPLANT
MARKER SKIN DUAL TIP RULER LAB (MISCELLANEOUS) IMPLANT
NEEDLE FILTER BLUNT 18X 1/2SAF (NEEDLE) ×1
NEEDLE FILTER BLUNT 18X1 1/2 (NEEDLE) ×1 IMPLANT
PACK EYE AFTER SURG (MISCELLANEOUS) IMPLANT
SYR 3ML LL SCALE MARK (SYRINGE) ×2 IMPLANT
SYR TB 1ML LUER SLIP (SYRINGE) ×4 IMPLANT
WATER STERILE IRR 250ML POUR (IV SOLUTION) ×2 IMPLANT
WIPE NON LINTING 3.25X3.25 (MISCELLANEOUS) IMPLANT

## 2021-05-11 NOTE — Op Note (Signed)
PREOPERATIVE DIAGNOSIS:  Nuclear sclerotic cataract of the left eye.   POSTOPERATIVE DIAGNOSIS:  Nuclear sclerotic cataract of the left eye.   OPERATIVE PROCEDURE:ORPROCALL@   SURGEON:  Birder Robson, MD.   ANESTHESIA:  Anesthesiologist: [name], MD CRNA: Silvana Newness, CRNA  1.      Managed anesthesia care. 2.     0.36ml of Shugarcaine was instilled following the paracentesis   COMPLICATIONS: Omidria was used to manage the atrophic iris.   TECHNIQUE:   Stop and chop   DESCRIPTION OF PROCEDURE:  The patient was examined and consented in the preoperative holding area where the aforementioned topical anesthesia was applied to the left eye and then brought back to the Operating Room where the left eye was prepped and draped in the usual sterile ophthalmic fashion and a lid speculum was placed. A paracentesis was created with the side port blade and the anterior chamber was filled with viscoelastic. A near clear corneal incision was performed with the steel keratome. A continuous curvilinear capsulorrhexis was performed with a cystotome followed by the capsulorrhexis forceps. Hydrodissection and hydrodelineation were carried out with BSS on a blunt cannula. The lens was removed in a stop and chop  technique and the remaining cortical material was removed with the irrigation-aspiration handpiece. The capsular bag was inflated with viscoelastic and the Technis ZCB00 lens was placed in the capsular bag without complication. The remaining viscoelastic was removed from the eye with the irrigation-aspiration handpiece. The wounds were hydrated. The anterior chamber was flushed with BSS and the eye was inflated to physiologic pressure. 0.29ml Vigamox was placed in the anterior chamber. The wounds were found to be water tight. The eye was dressed with Combigan. The patient was given protective glasses to wear throughout the day and a shield with which to sleep tonight. The patient was also given  drops with which to begin a drop regimen today and will follow-up with me in one day. Implant Name Type Inv. Item Serial No. Manufacturer Lot No. LRB No. Used Action  LENS IOL TECNIS EYHANCE 20.5 - [id number] Intraocular Lens LENS IOL TECNIS EYHANCE 20.5 [id number] JOHNSON   Left 1 Implanted    Procedure(s): CATARACT EXTRACTION PHACO AND INTRAOCULAR LENS PLACEMENT (IOC) LEFT DIABETIC OMIDRIA 12.32 01:31.4  (Left)  Electronically signed: Birder Robson 05/11/2021 8:27 AM

## 2021-05-11 NOTE — Transfer of Care (Signed)
Immediate Anesthesia Transfer of Care Note  Patient: Amber Fields  Procedure(s) Performed: CATARACT EXTRACTION PHACO AND INTRAOCULAR LENS PLACEMENT (IOC) LEFT DIABETIC OMIDRIA 12.32 01:31.4  (Left: Eye)  Patient Location: PACU  Anesthesia Type: MAC  Level of Consciousness: awake, alert  and patient cooperative  Airway and Oxygen Therapy: Patient Spontanous Breathing and Patient connected to supplemental oxygen  Post-op Assessment: Post-op Vital signs reviewed, Patient's Cardiovascular Status Stable, Respiratory Function Stable, Patent Airway and No signs of Nausea or vomiting  Post-op Vital Signs: Reviewed and stable  Complications: No notable events documented.

## 2021-05-11 NOTE — Anesthesia Preprocedure Evaluation (Signed)
Anesthesia Evaluation  Patient identified by MRN, date of birth, ID band Patient awake    Reviewed: Allergy & Precautions, H&P , NPO status , Patient's Chart, lab work & pertinent test results  Airway Mallampati: II  TM Distance: >3 FB Neck ROM: full    Dental no notable dental hx. (+) Upper Dentures, Lower Dentures   Pulmonary    Pulmonary exam normal breath sounds clear to auscultation       Cardiovascular hypertension, Normal cardiovascular exam Rhythm:regular Rate:Normal  TIA   Neuro/Psych    GI/Hepatic   Endo/Other  diabetes, Type 2Hyperparathyroid  Renal/GU Renal disease     Musculoskeletal   Abdominal   Peds  Hematology   Anesthesia Other Findings   Reproductive/Obstetrics                             Anesthesia Physical  Anesthesia Plan  ASA: 3  Anesthesia Plan: MAC   Post-op Pain Management:    Induction:   PONV Risk Score and Plan: 2 and Treatment may vary due to age or medical condition, Midazolam and TIVA  Airway Management Planned:   Additional Equipment:   Intra-op Plan:   Post-operative Plan:   Informed Consent: I have reviewed the patients History and Physical, chart, labs and discussed the procedure including the risks, benefits and alternatives for the proposed anesthesia with the patient or authorized representative who has indicated his/her understanding and acceptance.       Plan Discussed with: CRNA  Anesthesia Plan Comments:         Anesthesia Quick Evaluation

## 2021-05-11 NOTE — H&P (Signed)
[place]   Primary Care Physician:  [facility] Ophthalmologist: Dr. George Ina  Pre-Procedure History & Physical: HPI:  Amber Fields is a 87 y.o. female here for cataract surgery.   Past Medical History:  Diagnosis Date   Cancer ([place])    skin ca   Chronic kidney disease    Diabetes mellitus without complication ([place])    Hyperparathyroidism ([place])    Hypertension    Transient cerebral ischemia     Past Surgical History:  Procedure Laterality Date   ABDOMINAL HYSTERECTOMY     CATARACT EXTRACTION W/PHACO Right 04/27/2021   Procedure: CATARACT EXTRACTION PHACO AND INTRAOCULAR LENS PLACEMENT ([place]) RIGHT DIABETIC OMIDRIA 8.64 00:52.6;  Surgeon: Birder Robson, MD;  Location: [facility];  Service: Ophthalmology;  Laterality: Right;  covid + 02-10-21    Prior to Admission medications   Medication Sig Start Date End Date Taking? Authorizing Provider  acetaminophen (TYLENOL) 500 MG tablet Take 500 mg by mouth every 6 (six) hours as needed.   Yes [provider]  ascorbic acid (VITAMIN C) 500 MG tablet Take 1 tablet (500 mg total) by mouth daily. 02/13/21 05/14/21 Yes Val Riles, MD  aspirin 81 MG EC tablet Take 81 mg by mouth daily.   Yes [provider]  atorvastatin (LIPITOR) 40 MG tablet Take 40 mg by mouth daily.   Yes [provider]  colchicine 0.6 MG tablet Take 0.6 mg by mouth 2 (two) times daily as needed (acute gout flare).   Yes [provider]  glimepiride (AMARYL) 4 MG tablet Take 4 mg by mouth daily.   Yes [provider]  guaiFENesin-dextromethorphan (ROBITUSSIN DM) 100-10 MG/5ML syrup Take 10 mLs by mouth every 4 (four) hours as needed for cough. 02/12/21  Yes Val Riles, MD  iron polysaccharides (NIFEREX) 150 MG capsule Take 1 capsule (150 mg total) by mouth daily. 02/13/21 05/14/21 Yes Val Riles, MD  linagliptin (TRADJENTA) 5 MG TABS tablet Take 5 mg by mouth daily.   Yes [provider]  lisinopril (ZESTRIL) 40 MG tablet Take 40 mg by mouth daily.   Yes [provider]  pioglitazone (ACTOS) 15 MG tablet Take 15 mg by mouth daily.   Yes [provider]  Vitamin D, Ergocalciferol, (DRISDOL) 1.25 MG (50000 UNIT) CAPS capsule Take 50,000 Units by mouth once a week.   Yes [provider]  vitamin B-12 (CYANOCOBALAMIN) 500 MCG tablet Take 1 tablet (500 mcg total) by mouth daily. Patient not taking: Reported on 04/16/2021 02/19/21 05/20/21  Val Riles, MD    Allergies as of 03/24/2021 - Review Complete 02/10/2021  Allergen Reaction Noted   Pravastatin Other (See Comments) 05/25/2014   Codeine Other (See Comments) 05/26/2014   Ibuprofen Other (See Comments) 03/20/2018   Penicillins  05/25/2014   Sulfa antibiotics Other (See Comments) 05/21/2014   Lansoprazole Diarrhea 05/21/2014    Family History  Problem Relation Age of Onset   Breast cancer Cousin        mat cousin    Social History   Socioeconomic History   Marital status: Widowed    Spouse name: Not on file   Number of children: Not on file   Years of education: Not on file   Highest education level: Not on file  Occupational History   Not on file  Tobacco Use   Smoking status: Never   Smokeless tobacco: Never  Substance and Sexual Activity   Alcohol use: Not on file   Drug  use: Not on file   Sexual activity: Not on file  Other Topics Concern   Not on file  Social History Narrative   Not on file   Social Determinants of Health   Financial Resource Strain: Not on file  Food Insecurity: Not on file  Transportation Needs: Not on file  Physical Activity: Not on file  Stress: Not on file  Social Connections: Not on file  Intimate Partner Violence: Not on file    Review of Systems: See HPI, otherwise negative ROS  Physical Exam: BP 138/63   Pulse (!) 59   Temp (!) 97.3 F (36.3 C) (Temporal)   Resp 16   Ht 5\' 8"  (1.727 m)   Wt 93.9 kg   SpO2 100%    BMI 31.47 kg/m  General:   Alert, cooperative in NAD Head:  Normocephalic and atraumatic. Respiratory:  Normal work of breathing. Cardiovascular:  RRR  Impression/Plan: Amber Fields is here for cataract surgery.  Risks, benefits, limitations, and alternatives regarding cataract surgery have been reviewed with the patient.  Questions have been answered.  All parties agreeable.   Birder Robson, MD  05/11/2021, 7:53 AM

## 2021-05-11 NOTE — Anesthesia Postprocedure Evaluation (Signed)
Anesthesia Post Note  Patient: Amber Fields  Procedure(s) Performed: CATARACT EXTRACTION PHACO AND INTRAOCULAR LENS PLACEMENT (IOC) LEFT DIABETIC OMIDRIA 12.32 01:31.4  (Left: Eye)     Patient location during evaluation: PACU Anesthesia Type: MAC Level of consciousness: awake and alert Pain management: pain level controlled Vital Signs Assessment: post-procedure vital signs reviewed and stable Respiratory status: spontaneous breathing, nonlabored ventilation and respiratory function stable Cardiovascular status: stable and blood pressure returned to baseline Postop Assessment: no apparent nausea or vomiting Anesthetic complications: no   No notable events documented.  [name]

## 2021-05-12 ENCOUNTER — Encounter: Payer: Self-pay | Admitting: Ophthalmology

## 2021-11-03 ENCOUNTER — Inpatient Hospital Stay
Admission: EM | Admit: 2021-11-03 | Discharge: 2021-12-06 | DRG: 094 | Disposition: E | Payer: Medicare Other | Attending: Pulmonary Disease | Admitting: Pulmonary Disease

## 2021-11-03 ENCOUNTER — Other Ambulatory Visit: Payer: Self-pay

## 2021-11-03 DIAGNOSIS — I129 Hypertensive chronic kidney disease with stage 1 through stage 4 chronic kidney disease, or unspecified chronic kidney disease: Secondary | ICD-10-CM | POA: Diagnosis present

## 2021-11-03 DIAGNOSIS — Z803 Family history of malignant neoplasm of breast: Secondary | ICD-10-CM

## 2021-11-03 DIAGNOSIS — E1122 Type 2 diabetes mellitus with diabetic chronic kidney disease: Secondary | ICD-10-CM | POA: Diagnosis present

## 2021-11-03 DIAGNOSIS — H9190 Unspecified hearing loss, unspecified ear: Secondary | ICD-10-CM

## 2021-11-03 DIAGNOSIS — N179 Acute kidney failure, unspecified: Secondary | ICD-10-CM | POA: Diagnosis present

## 2021-11-03 DIAGNOSIS — Z85828 Personal history of other malignant neoplasm of skin: Secondary | ICD-10-CM

## 2021-11-03 DIAGNOSIS — E669 Obesity, unspecified: Secondary | ICD-10-CM

## 2021-11-03 DIAGNOSIS — Z90710 Acquired absence of both cervix and uterus: Secondary | ICD-10-CM

## 2021-11-03 DIAGNOSIS — M109 Gout, unspecified: Secondary | ICD-10-CM | POA: Diagnosis present

## 2021-11-03 DIAGNOSIS — E871 Hypo-osmolality and hyponatremia: Secondary | ICD-10-CM | POA: Diagnosis not present

## 2021-11-03 DIAGNOSIS — Z79899 Other long term (current) drug therapy: Secondary | ICD-10-CM

## 2021-11-03 DIAGNOSIS — E1165 Type 2 diabetes mellitus with hyperglycemia: Secondary | ICD-10-CM | POA: Diagnosis not present

## 2021-11-03 DIAGNOSIS — R531 Weakness: Secondary | ICD-10-CM

## 2021-11-03 DIAGNOSIS — E785 Hyperlipidemia, unspecified: Secondary | ICD-10-CM | POA: Diagnosis present

## 2021-11-03 DIAGNOSIS — N184 Chronic kidney disease, stage 4 (severe): Secondary | ICD-10-CM

## 2021-11-03 DIAGNOSIS — R1310 Dysphagia, unspecified: Secondary | ICD-10-CM

## 2021-11-03 DIAGNOSIS — E213 Hyperparathyroidism, unspecified: Secondary | ICD-10-CM | POA: Diagnosis present

## 2021-11-03 DIAGNOSIS — M4802 Spinal stenosis, cervical region: Secondary | ICD-10-CM | POA: Diagnosis present

## 2021-11-03 DIAGNOSIS — E1169 Type 2 diabetes mellitus with other specified complication: Secondary | ICD-10-CM

## 2021-11-03 DIAGNOSIS — G610 Guillain-Barre syndrome: Secondary | ICD-10-CM | POA: Diagnosis not present

## 2021-11-03 DIAGNOSIS — I4891 Unspecified atrial fibrillation: Secondary | ICD-10-CM | POA: Diagnosis not present

## 2021-11-03 DIAGNOSIS — J690 Pneumonitis due to inhalation of food and vomit: Secondary | ICD-10-CM | POA: Diagnosis not present

## 2021-11-03 DIAGNOSIS — U071 COVID-19: Secondary | ICD-10-CM

## 2021-11-03 DIAGNOSIS — R29818 Other symptoms and signs involving the nervous system: Secondary | ICD-10-CM | POA: Diagnosis not present

## 2021-11-03 DIAGNOSIS — Z8673 Personal history of transient ischemic attack (TIA), and cerebral infarction without residual deficits: Secondary | ICD-10-CM

## 2021-11-03 DIAGNOSIS — D6489 Other specified anemias: Secondary | ICD-10-CM | POA: Diagnosis not present

## 2021-11-03 DIAGNOSIS — E119 Type 2 diabetes mellitus without complications: Secondary | ICD-10-CM

## 2021-11-03 DIAGNOSIS — I959 Hypotension, unspecified: Secondary | ICD-10-CM | POA: Diagnosis not present

## 2021-11-03 DIAGNOSIS — Z7982 Long term (current) use of aspirin: Secondary | ICD-10-CM

## 2021-11-03 DIAGNOSIS — M6281 Muscle weakness (generalized): Principal | ICD-10-CM

## 2021-11-03 DIAGNOSIS — Z7984 Long term (current) use of oral hypoglycemic drugs: Secondary | ICD-10-CM

## 2021-11-03 DIAGNOSIS — IMO0001 Reserved for inherently not codable concepts without codable children: Secondary | ICD-10-CM

## 2021-11-03 DIAGNOSIS — Z6833 Body mass index (BMI) 33.0-33.9, adult: Secondary | ICD-10-CM

## 2021-11-03 DIAGNOSIS — Z8616 Personal history of COVID-19: Secondary | ICD-10-CM

## 2021-11-03 DIAGNOSIS — R471 Dysarthria and anarthria: Secondary | ICD-10-CM | POA: Diagnosis present

## 2021-11-03 DIAGNOSIS — J9601 Acute respiratory failure with hypoxia: Secondary | ICD-10-CM | POA: Diagnosis not present

## 2021-11-03 DIAGNOSIS — G928 Other toxic encephalopathy: Secondary | ICD-10-CM | POA: Diagnosis present

## 2021-11-03 DIAGNOSIS — J9602 Acute respiratory failure with hypercapnia: Secondary | ICD-10-CM | POA: Diagnosis not present

## 2021-11-03 DIAGNOSIS — Z66 Do not resuscitate: Secondary | ICD-10-CM | POA: Diagnosis not present

## 2021-11-03 DIAGNOSIS — Z515 Encounter for palliative care: Secondary | ICD-10-CM

## 2021-11-03 LAB — CBC
HCT: 40.1 % (ref 36.0–46.0)
HCT: 38.5 % (ref 36.0–46.0)
Hemoglobin: 12.7 g/dL (ref 12.0–15.0)
Hemoglobin: 12.3 g/dL (ref 12.0–15.0)
MCH: 29.4 pg (ref 26.0–34.0)
MCH: 29.1 pg (ref 26.0–34.0)
MCHC: 31.7 g/dL (ref 30.0–36.0)
MCHC: 31.9 g/dL (ref 30.0–36.0)
MCV: 92.8 fL (ref 80.0–100.0)
MCV: 91.2 fL (ref 80.0–100.0)
Platelets: 544 10*3/uL — ABNORMAL HIGH (ref 150–400)
Platelets: 474 10*3/uL — ABNORMAL HIGH (ref 150–400)
RBC: 4.32 MIL/uL (ref 3.87–5.11)
RBC: 4.22 MIL/uL (ref 3.87–5.11)
RDW: 14.3 % (ref 11.5–15.5)
RDW: 14.4 % (ref 11.5–15.5)
WBC: 13.3 10*3/uL — ABNORMAL HIGH (ref 4.0–10.5)
WBC: 12.8 10*3/uL — ABNORMAL HIGH (ref 4.0–10.5)
nRBC: 0 % (ref 0.0–0.2)
nRBC: 0 % (ref 0.0–0.2)

## 2021-11-03 LAB — DIFFERENTIAL
Abs Immature Granulocytes: 0.46 10*3/uL — ABNORMAL HIGH (ref 0.00–0.07)
Basophils Absolute: 0.1 10*3/uL (ref 0.0–0.1)
Basophils Relative: 1 %
Eosinophils Absolute: 0.3 10*3/uL (ref 0.0–0.5)
Eosinophils Relative: 2 %
Immature Granulocytes: 4 %
Lymphocytes Relative: 19 %
Lymphs Abs: 2.5 10*3/uL (ref 0.7–4.0)
Monocytes Absolute: 0.8 10*3/uL (ref 0.1–1.0)
Monocytes Relative: 6 %
Neutro Abs: 9.2 10*3/uL — ABNORMAL HIGH (ref 1.7–7.7)
Neutrophils Relative %: 68 %

## 2021-11-03 LAB — GLUCOSE, CAPILLARY: Glucose-Capillary: 96 mg/dL (ref 70–99)

## 2021-11-03 LAB — COMPREHENSIVE METABOLIC PANEL
ALT: 21 U/L (ref 0–44)
AST: 20 U/L (ref 15–41)
Albumin: 3.5 g/dL (ref 3.5–5.0)
Alkaline Phosphatase: 97 U/L (ref 38–126)
Anion gap: 8 (ref 5–15)
BUN: 39 mg/dL — ABNORMAL HIGH (ref 8–23)
CO2: 25 mmol/L (ref 22–32)
Calcium: 9.8 mg/dL (ref 8.9–10.3)
Chloride: 105 mmol/L (ref 98–111)
Creatinine, Ser: 1.68 mg/dL — ABNORMAL HIGH (ref 0.44–1.00)
GFR, Estimated: 29 mL/min — ABNORMAL LOW (ref >60)
Glucose, Bld: 117 mg/dL — ABNORMAL HIGH (ref 70–99)
Potassium: 4.4 mmol/L (ref 3.5–5.1)
Sodium: 138 mmol/L (ref 135–145)
Total Bilirubin: 0.6 mg/dL (ref 0.3–1.2)
Total Protein: 7.3 g/dL (ref 6.5–8.1)

## 2021-11-03 LAB — PROTIME-INR
INR: 1 (ref 0.8–1.2)
Prothrombin Time: 13.2 seconds (ref 11.4–15.2)

## 2021-11-03 LAB — APTT: aPTT: 27 seconds (ref 24–36)

## 2021-11-03 LAB — CREATININE, SERUM
Creatinine, Ser: 1.58 mg/dL — ABNORMAL HIGH (ref 0.44–1.00)
GFR, Estimated: 32 mL/min — ABNORMAL LOW (ref >60)

## 2021-11-03 LAB — CBG MONITORING, ED: Glucose-Capillary: 111 mg/dL — ABNORMAL HIGH (ref 70–99)

## 2021-11-03 LAB — CK: Total CK: 109 U/L (ref 38–234)

## 2021-11-03 MED ORDER — SODIUM CHLORIDE 0.9 % IV SOLN: INTRAVENOUS | Status: DC

## 2021-11-03 MED ORDER — ACETAMINOPHEN 650 MG RE SUPP: 650.0000 mg | RECTAL | Status: DC | PRN

## 2021-11-03 MED ORDER — ATORVASTATIN CALCIUM 20 MG PO TABS: 40.0000 mg | ORAL_TABLET | Freq: Every day | ORAL | Status: DC

## 2021-11-03 MED ORDER — ASPIRIN EC 81 MG PO TBEC
81.0000 mg | DELAYED_RELEASE_TABLET | Freq: Every day | ORAL | Status: DC
Start: 2021-11-04 — End: 2021-11-04

## 2021-11-03 MED ORDER — STROKE: EARLY STAGES OF RECOVERY BOOK: Freq: Once | Status: AC

## 2021-11-03 MED ORDER — ACETAMINOPHEN 160 MG/5ML PO SOLN
650.0000 mg | ORAL | Status: DC | PRN
  Filled 2021-11-03: qty 20.3

## 2021-11-03 MED ORDER — SODIUM CHLORIDE 0.9% FLUSH: 3.0000 mL | Freq: Once | INTRAVENOUS | Status: DC

## 2021-11-03 MED ORDER — ENOXAPARIN SODIUM 40 MG/0.4ML IJ SOSY
40.0000 mg | PREFILLED_SYRINGE | INTRAMUSCULAR | Status: DC
  Administered 2021-11-03: 40 mg via SUBCUTANEOUS
  Filled 2021-11-03: qty 0.4

## 2021-11-03 MED ORDER — INSULIN ASPART 100 UNIT/ML IJ SOLN: 0.0000 [IU] | Freq: Every day | INTRAMUSCULAR | Status: DC

## 2021-11-03 MED ORDER — INSULIN ASPART 100 UNIT/ML IJ SOLN: 0.0000 [IU] | Freq: Three times a day (TID) | INTRAMUSCULAR | Status: DC

## 2021-11-03 MED ORDER — LISINOPRIL 20 MG PO TABS: 40.0000 mg | ORAL_TABLET | Freq: Every day | ORAL | Status: DC

## 2021-11-03 MED ORDER — ACETAMINOPHEN 325 MG PO TABS
650.0000 mg | ORAL_TABLET | ORAL | Status: DC | PRN
  Administered 2021-11-11: 650 mg via ORAL
  Filled 2021-11-03: qty 2

## 2021-11-03 NOTE — Assessment & Plan Note (Deleted)
CKD normal at 109 ?Await neurology recommendations ?PT OT eval and speech therapy eval ?

## 2021-11-03 NOTE — Assessment & Plan Note (Addendum)
Creatinine a little better with IV fluids.  Creatinine down to 1.45 and GFR of 35 today ?

## 2021-11-03 NOTE — Assessment & Plan Note (Addendum)
Aspirin atorvastatin once able to swallow. ?

## 2021-11-03 NOTE — Assessment & Plan Note (Deleted)
Patient with bilateral muscle weakness with grip strength and also shoulder strength.  Patient with weakness of the legs and also with trouble swallowing and slurred speech.  Case discussed with neurology and we will get an MRI of the brain and cervical spine with and without contrast for further evaluation.  If negative may end up needing a lumbar puncture. ? ?

## 2021-11-03 NOTE — H&P (Signed)
?History and Physical  ? ? ?Patient: Amber Fields HKV:[id number] DOB: Mar 25, 1933 ?DOA: 10/30/2021 ?DOS: the patient was seen and examined on 10/09/2021 ?PCP: [facility]  ?Patient coming from: Home ? ?Chief Complaint:  ?Chief Complaint  ?Patient presents with  ? Weakness  ? ? ?HPI: Amber Fields is a 88 y.o. female with medical history significant for DM, HTN, hearing impairment, CKD 4, history of TIA in January 2021, who walks independently, who presents to the ED for evaluation of 2 days of weakness of extremities, with inability to walk and inability to raise arms.  She also has reported slurred speech and difficulty swallowing.  Patient was [facility] at the [facility] ED on 3/28 and had a work-up that included MRI and MRA of the brain with no acute findings and she was discharged home.  She was brought back to the emergency room due to persistent, not improving symptoms.  Patient states she had a cough for the past 2 weeks which has improved but since then has had a dry cough and difficulty swallowing. ?ED course: BP 144/74 in the ED with otherwise normal vitals.  Blood work significant for WBC 13,000, platelets 5 44,000, creatinine 1.68 which is at baseline.  CK1 109.  CBC and CMP otherwise unremarkable.  Imaging studies were not repeated.  EKG, personally viewed and interpreted: NSR at 72 with no acute ST-T wave changes ? ?MRI/MRA report from [facility] are as follows: ? ?FINDINGS:   ?There are scattered foci of signal abnormality within the periventricular and deep white matter.  These are nonspecific but commonly seen with small vessel ischemic changes. . Ventricles are normal in size. There is no midline shift. No extra-axial fluid collection. No evidence of intracranial hemorrhage. No diffusion weighted signal abnormality to suggest acute infarct. No mass.  ? ?Periventricular white matter focal and confluent T2/FLAIR hyperintense lesions most commonly seen in the setting of chronic ischemic small  vessel disease  ? ?IMPRESSION:  ?No high-grade stenosis of the common, external and internal carotid arteries.  ? ?Diminutive right vertebral artery. There is loss of signal within the V3 portion of the right vertebral artery which may be artifactual as signal is noted distally within the vessel. Can consider contrasted examination for better evaluation if clinically indicated.  ? ?Hospitalist consulted for admission.  Tested positive for COVID after admission. ? ?Review of Systems: As mentioned in the history of present illness. All other systems reviewed and are negative. ?Past Medical History:  ?Diagnosis Date  ? Cancer [facility])   ? skin ca  ? Chronic kidney disease   ? Diabetes mellitus without complication ([place])   ? Hyperparathyroidism ([place])   ? Hypertension   ? Transient cerebral ischemia   ? ?Past Surgical History:  ?Procedure Laterality Date  ? ABDOMINAL HYSTERECTOMY    ? CATARACT EXTRACTION W/PHACO Right 04/27/2021  ? Procedure: CATARACT EXTRACTION PHACO AND INTRAOCULAR LENS PLACEMENT ([place]) RIGHT DIABETIC OMIDRIA 8.64 00:52.6;  Surgeon: Birder Robson, MD;  Location: [place];  Service: Ophthalmology;  Laterality: Right;  covid + 02-10-21  ? CATARACT EXTRACTION W/PHACO Left 05/11/2021  ? Procedure: CATARACT EXTRACTION PHACO AND INTRAOCULAR LENS PLACEMENT ([place]) LEFT DIABETIC OMIDRIA 12.32 01:31.4 ;  Surgeon: Birder Robson, MD;  Location: [place];  Service: Ophthalmology;  Laterality: Left;  ? ?Social History:  reports that she has never smoked. She has never used smokeless tobacco. No history on file for alcohol use and drug use. ? ?Allergies  ?Allergen Reactions  ? Pravastatin Other (  See Comments)  ?  Other reaction(s): Dizziness, Muscle Pain  ? Codeine Other (See Comments)  ?  Other reaction(s): Dizziness  ? Ibuprofen Other (See Comments)  ?  Other reaction(s): Dizziness  ? Penicillins   ?  Other reaction(s): Other (See Comments) ?Tongue and facial swelling ?Other reaction(s): Other  (See Comments) ?Tongue and facial swelling ?  ? Sulfa Antibiotics Other (See Comments)  ? Lansoprazole Diarrhea  ? ? ?Family History  ?Problem Relation Age of Onset  ? Breast cancer Cousin   ?     mat cousin  ? ? ?Prior to Admission medications   ?Medication Sig Start Date End Date Taking? Authorizing Provider  ?acetaminophen (TYLENOL) 500 MG tablet Take 500 mg by mouth every 6 (six) hours as needed.    [provider]  ?aspirin 81 MG EC tablet Take 81 mg by mouth daily.    [provider]  ?atorvastatin (LIPITOR) 40 MG tablet Take 40 mg by mouth daily.    [provider]  ?colchicine 0.6 MG tablet Take 0.6 mg by mouth 2 (two) times daily as needed (acute gout flare).    [provider]  ?glimepiride (AMARYL) 4 MG tablet Take 4 mg by mouth daily.    [provider]  ?guaiFENesin-dextromethorphan (ROBITUSSIN DM) 100-10 MG/5ML syrup Take 10 mLs by mouth every 4 (four) hours as needed for cough. 02/12/21   Val Riles, MD  ?iron polysaccharides (NIFEREX) 150 MG capsule Take 1 capsule (150 mg total) by mouth daily. 02/13/21 05/14/21  Val Riles, MD  ?linagliptin (TRADJENTA) 5 MG TABS tablet Take 5 mg by mouth daily.    [provider]  ?lisinopril (ZESTRIL) 40 MG tablet Take 40 mg by mouth daily.    [provider]  ?pioglitazone (ACTOS) 15 MG tablet Take 15 mg by mouth daily.    [provider]  ?Vitamin D, Ergocalciferol, (DRISDOL) 1.25 MG (50000 UNIT) CAPS capsule Take 50,000 Units by mouth once a week.    [provider]  ? ? ?Physical Exam: ?Vitals:  ? 10/17/2021 1359 10/19/2021 1400 10/13/2021 1630 10/27/2021 1700  ?BP: (!) 144/74  138/63 (!) 135/55  ?Pulse: 69  63 64  ?Resp: '18  10 19  '$ ?Temp: 97.7 ?F (36.5 ?C)     ?TempSrc: Oral     ?SpO2: 98%  97% 94%  ?Weight:  99.8 kg    ?Height:  '5\' 8"'$  (1.727 m)    ? ?Physical Exam ?Vitals and nursing note reviewed.  ?Constitutional:   ?   General: She is not in acute distress. ?HENT:  ?   Head:  Normocephalic and atraumatic.  ?Cardiovascular:  ?   Rate and Rhythm: Normal rate and regular rhythm.  ?   Pulses: Normal pulses.  ?   Heart sounds: Normal heart sounds.  ?Pulmonary:  ?   Effort: Pulmonary effort is normal.  ?   Breath sounds: Normal breath sounds.  ?Abdominal:  ?   Palpations: Abdomen is soft.  ?   Tenderness: There is no abdominal tenderness.  ?Neurological:  ?   Comments: Patient with good grip strength but unable to lift at the level of the shoulders  ? ? ? ?Data Reviewed: ?Relevant notes from primary care and specialist visits, past discharge summaries as available in EHR, including Care Everywhere. ?Prior diagnostic testing as pertinent to current admission diagnoses ?Updated medications and problem lists for reconciliation ?ED course, including vitals, labs, imaging, treatment and response to treatment ?Triage notes, nursing and pharmacy notes  and ED provider's notes ?Notable results as noted in HPI ? ? ?Assessment and Plan: ?* Acute focal neurological deficit ?Patient with proximal girdle weakness, dysphagia, slurred speech ?Etiology uncertain but deficit appears to be symmetrical but given history of TIA, possibility of CVA ?Neurology consult for evaluation ?Continue aspirin and atorvastatin ?Patient is outside tPA window given onset of symptoms 48 hours ago ?Continue neurologic checks ?We will get stroke work-up to include continuous cardiac monitoring, echocardiogram ?Patient had MRI/MRA on 3/28.  Will defer decision for further imaging to neurology ?PT OT and speech therapy eval's ? ?Weakness ?Addendum post admission: Patient tested positive for COVID which could be etiology of weakness ? ?CKD normal at 109 ?PT OT eval and speech therapy eval ? ? ?Hearing impairment ?Increase nursing assistance for communication needs ? ?History of TIA  08/2019(transient ischemic attack) ?Aspirin atorvastatin ? ?CKD (chronic kidney disease) stage 4, GFR 15-29 ml/min (HCC) ?Renal function at  baseline ? ?Diabetes mellitus without complication ([place]) ?Sliding scale insulin coverage ? ?Lab test positive for detection of COVID-19 virus ?Resulted postadmission ?Patient had a cough that started 2 weeks prior so outside

## 2021-11-03 NOTE — ED Notes (Signed)
Messaged MD regarding orders for this pt at this time.  ?

## 2021-11-03 NOTE — ED Triage Notes (Addendum)
Pt comes via EMs from home with c/o increased weakness that started yesterday. Pt states her arms just both went weak. Pt states right more than the left. Pt states she went to [facility] and had CT which was neg. ? ?EMS reports neg stroke screen today.  ?

## 2021-11-03 NOTE — Assessment & Plan Note (Addendum)
I

## 2021-11-03 NOTE — ED Provider Notes (Signed)
? ?[facility] ?Provider Note ? ? ? Event Date/Time  ? First MD Initiated Contact with Patient 10/20/2021 1612   ?  (approximate) ? ? ?History  ? ?Weakness ? ? ?HPI ? ?Amber Fields is a 88 y.o. female who presents to the ED for evaluation of Weakness ?  ?I reviewed [facility] ED visit from yesterday where patient was evaluated for slurred speech and generalized weakness.  Had an MRI brain and C-spine without acute intracranial pathology.  Ultimately fairly benign work-up and discharged home with PCP follow-up precautions. ?Looks like she was diagnosed with acute bronchitis on 3/24 and provided prescriptions for prednisone and a Z-Pak. ? ?Patient presents to the ED with her daughter and daughter-in-law for evaluation of continued weakness.  They report just 2 or 3 days of increased weakness to her bilateral upper extremities primarily.  At baseline, patient ambulates around the house independently, living with the daughter. ? ?Over the past 2 days, she has been unable to even get up, let alone walk with assistance.  Unable to lift her elbows off the bed with bilateral proximal upper extremity weakness, but no significant weakness distally. ? ?They further report dysphagia and that her voice seems more slurred.  Unable to eat anything.  She has a choking episode in the ER when I first walked in to evaluate her.  Just trying to drink some water. ? ? ?Physical Exam  ? ?Triage Vital Signs: ?ED Triage Vitals  ?Enc Vitals Group  ?   BP 10/13/2021 1359 (!) 144/74  ?   Pulse Rate 10/20/2021 1359 69  ?   Resp 10/30/2021 1359 18  ?   Temp 10/18/2021 1359 97.7 ?F (36.5 ?C)  ?   Temp Source 10/27/2021 1359 Oral  ?   SpO2 10/20/2021 1359 98 %  ?   Weight 10/25/2021 1400 220 lb (99.8 kg)  ?   Height 10/15/2021 1400 '5\' 8"'$  (1.727 m)  ?   Head Circumference --   ?   Peak Flow --   ?   Pain Score 10/29/2021 1333 0  ?   Pain Loc --   ?   Pain Edu? --   ?   Excl. in [place]? --   ? ? ?Most recent vital signs: ?Vitals:  ? 10/10/2021  1630 10/26/2021 1700  ?BP: 138/63 (!) 135/55  ?Pulse: 63 64  ?Resp: 10 19  ?Temp:    ?SpO2: 97% 94%  ? ? ?General: Awake, no distress.  Obese.  Supine in bed and pleasant.  Frequently with a wet-sounding cough when I first come see her but she eventually clears this and can converse. ?CV:  Good peripheral perfusion. RRR ?Resp:  Normal effort. CTAB ?Abd:  No distention. Soft and benign ?MSK:  No deformity noted.  ?Neuro:  No focal deficits appreciated. ?She seems to have some proximal muscle weakness to all 4 extremities without laterality or focal features.  Distal strength is intact with ankle extension, grip strength.  Cranial nerves intact ?Other:   ? ? ?ED Results / Procedures / Treatments  ? ?Labs ?(all labs ordered are listed, but only abnormal results are displayed) ?Labs Reviewed  ?CBC - Abnormal; Notable for the following components:  ?    Result Value  ? WBC 13.3 (*)   ? Platelets 544 (*)   ? All other components within normal limits  ?DIFFERENTIAL - Abnormal; Notable for the following components:  ? Neutro Abs 9.2 (*)   ? Abs Immature Granulocytes 0.46 (*)   ?  All other components within normal limits  ?COMPREHENSIVE METABOLIC PANEL - Abnormal; Notable for the following components:  ? Glucose, Bld 117 (*)   ? BUN 39 (*)   ? Creatinine, Ser 1.68 (*)   ? GFR, Estimated 29 (*)   ? All other components within normal limits  ?CBG MONITORING, ED - Abnormal; Notable for the following components:  ? Glucose-Capillary 111 (*)   ? All other components within normal limits  ?PROTIME-INR  ?APTT  ?CK  ?HEMOGLOBIN A1C  ?LIPID PANEL  ?CBC  ?CREATININE, SERUM  ?I-STAT CREATININE, ED  ? ? ?EKG ?Sinus rhythm, rate of 72 bpm.  Normal axis and intervals.  No evidence of acute ischemia. ? ?RADIOLOGY ?MRIs of head and neck obtained at [facility] ED yesterday reviewed by me. ? ?Official radiology report(s): ?No results found. ? ?PROCEDURES and INTERVENTIONS: ? ?.1-3 Lead EKG Interpretation ?Performed by: Vladimir Crofts,  MD ?Authorized by: Vladimir Crofts, MD  ? ?  Interpretation: normal   ?  ECG rate:  66 ?  ECG rate assessment: normal   ?  Rhythm: sinus rhythm   ?  Ectopy: none   ?  Conduction: normal   ? ?Medications  ?sodium chloride flush (NS) 0.9 % injection 3 mL (3 mLs Intravenous Not Given 10/08/2021 1628)  ?aspirin EC tablet 81 mg (has no administration in time range)  ?atorvastatin (LIPITOR) tablet 40 mg (has no administration in time range)  ?lisinopril (ZESTRIL) tablet 40 mg (has no administration in time range)  ? stroke: mapping our early stages of recovery book (has no administration in time range)  ?0.9 %  sodium chloride infusion (has no administration in time range)  ?acetaminophen (TYLENOL) tablet 650 mg (has no administration in time range)  ?  Or  ?acetaminophen (TYLENOL) 160 MG/5ML solution 650 mg (has no administration in time range)  ?  Or  ?acetaminophen (TYLENOL) suppository 650 mg (has no administration in time range)  ?enoxaparin (LOVENOX) injection 40 mg (has no administration in time range)  ?insulin aspart (novoLOG) injection 0-15 Units (has no administration in time range)  ?insulin aspart (novoLOG) injection 0-5 Units (has no administration in time range)  ? ? ? ?IMPRESSION / MDM / ASSESSMENT AND PLAN / ED COURSE  ?I reviewed the triage vital signs and the nursing notes. ? ?88 year old woman presents from home with just 2 or 3 days of proximal muscle weakness and dysphagia requiring medical admission.  She looks systemically well, although does have a coughing fit after choking on some water due to her dysphagia.  Having trouble getting food and her medications down at home as well.  She is typically ambulatory independently and this is a drastic difference from her baseline.  She just had MRIs performed yesterday that are reassuring and considering the chronicity of her symptoms I see no indication for emergent repeats of these MRIs.  Her EKG is nonischemic.  Blood work is generally benign with CKD,  normal CK and minimal leukocytosis is noted.  Possibly due to prednisone from her recent treatment of bronchitis.  Stroke is less likely as is MS.  She has no pain.  A myositis is possible, but her CK was normal.  Uncertain if this is deconditioning or any spinal process.  We will consult with medicine for admission. ? ?  ? ? ?FINAL CLINICAL IMPRESSION(S) / ED DIAGNOSES  ? ?Final diagnoses:  ?Proximal limb muscle weakness  ?Dysphagia, unspecified type  ? ? ? ?Rx / DC Orders  ? ?ED Discharge  Orders   ? ? None  ? ?  ? ? ? ?Note:  This document was prepared using Dragon voice recognition software and may include unintentional dictation errors. ?  ?Vladimir Crofts, MD ?10/21/2021 2014 ? ?

## 2021-11-03 NOTE — Assessment & Plan Note (Deleted)
Sliding scale insulin coverage 

## 2021-11-03 NOTE — ED Notes (Signed)
Pt states she can't lift her arms past her hips. Pt is able to lift legs. No numbness noted to arms or legs. No facial droop noted. Pt states she just feels so weak. Family reports pt is usually very active and that she is slower than normal now. Pt also has slurred speech present. Pt states trouble swallowing. Pt is A*OX4 but slow with some answers.  ? ?Family reports they are very concerned about pt not able to eat like usual and trouble with swallowing.  ? ?Will update MD at this time. ?

## 2021-11-04 ENCOUNTER — Inpatient Hospital Stay: Payer: Medicare Other

## 2021-11-04 ENCOUNTER — Observation Stay: Payer: Medicare Other

## 2021-11-04 ENCOUNTER — Observation Stay
Admit: 2021-11-04 | Discharge: 2021-11-04 | Disposition: A | Discharge status: Home / Self Care | Payer: Medicare Other | Attending: Internal Medicine | Admitting: Internal Medicine

## 2021-11-04 DIAGNOSIS — Z803 Family history of malignant neoplasm of breast: Secondary | ICD-10-CM | POA: Diagnosis not present

## 2021-11-04 DIAGNOSIS — E785 Hyperlipidemia, unspecified: Secondary | ICD-10-CM

## 2021-11-04 DIAGNOSIS — G610 Guillain-Barre syndrome: Secondary | ICD-10-CM | POA: Diagnosis present

## 2021-11-04 DIAGNOSIS — E1122 Type 2 diabetes mellitus with diabetic chronic kidney disease: Secondary | ICD-10-CM | POA: Diagnosis present

## 2021-11-04 DIAGNOSIS — J690 Pneumonitis due to inhalation of food and vomit: Secondary | ICD-10-CM | POA: Diagnosis not present

## 2021-11-04 DIAGNOSIS — R29818 Other symptoms and signs involving the nervous system: Secondary | ICD-10-CM | POA: Diagnosis not present

## 2021-11-04 DIAGNOSIS — G928 Other toxic encephalopathy: Secondary | ICD-10-CM | POA: Diagnosis present

## 2021-11-04 DIAGNOSIS — J9600 Acute respiratory failure, unspecified whether with hypoxia or hypercapnia: Secondary | ICD-10-CM | POA: Diagnosis not present

## 2021-11-04 DIAGNOSIS — J9601 Acute respiratory failure with hypoxia: Secondary | ICD-10-CM | POA: Diagnosis not present

## 2021-11-04 DIAGNOSIS — R531 Weakness: Secondary | ICD-10-CM | POA: Diagnosis present

## 2021-11-04 DIAGNOSIS — D6489 Other specified anemias: Secondary | ICD-10-CM | POA: Diagnosis not present

## 2021-11-04 DIAGNOSIS — A419 Sepsis, unspecified organism: Secondary | ICD-10-CM | POA: Diagnosis not present

## 2021-11-04 DIAGNOSIS — Z8673 Personal history of transient ischemic attack (TIA), and cerebral infarction without residual deficits: Secondary | ICD-10-CM | POA: Diagnosis not present

## 2021-11-04 DIAGNOSIS — E119 Type 2 diabetes mellitus without complications: Secondary | ICD-10-CM | POA: Diagnosis not present

## 2021-11-04 DIAGNOSIS — E1169 Type 2 diabetes mellitus with other specified complication: Secondary | ICD-10-CM | POA: Diagnosis present

## 2021-11-04 DIAGNOSIS — Z8616 Personal history of COVID-19: Secondary | ICD-10-CM | POA: Diagnosis not present

## 2021-11-04 DIAGNOSIS — E213 Hyperparathyroidism, unspecified: Secondary | ICD-10-CM | POA: Diagnosis present

## 2021-11-04 DIAGNOSIS — I129 Hypertensive chronic kidney disease with stage 1 through stage 4 chronic kidney disease, or unspecified chronic kidney disease: Secondary | ICD-10-CM | POA: Diagnosis present

## 2021-11-04 DIAGNOSIS — E669 Obesity, unspecified: Secondary | ICD-10-CM

## 2021-11-04 DIAGNOSIS — M6281 Muscle weakness (generalized): Secondary | ICD-10-CM | POA: Diagnosis not present

## 2021-11-04 DIAGNOSIS — Z66 Do not resuscitate: Secondary | ICD-10-CM | POA: Diagnosis not present

## 2021-11-04 DIAGNOSIS — J9602 Acute respiratory failure with hypercapnia: Secondary | ICD-10-CM | POA: Diagnosis not present

## 2021-11-04 DIAGNOSIS — Z7984 Long term (current) use of oral hypoglycemic drugs: Secondary | ICD-10-CM | POA: Diagnosis not present

## 2021-11-04 DIAGNOSIS — R1310 Dysphagia, unspecified: Secondary | ICD-10-CM | POA: Diagnosis present

## 2021-11-04 DIAGNOSIS — E871 Hypo-osmolality and hyponatremia: Secondary | ICD-10-CM | POA: Diagnosis not present

## 2021-11-04 DIAGNOSIS — E1165 Type 2 diabetes mellitus with hyperglycemia: Secondary | ICD-10-CM | POA: Diagnosis not present

## 2021-11-04 DIAGNOSIS — N179 Acute kidney failure, unspecified: Secondary | ICD-10-CM | POA: Diagnosis present

## 2021-11-04 DIAGNOSIS — Z515 Encounter for palliative care: Secondary | ICD-10-CM | POA: Diagnosis not present

## 2021-11-04 DIAGNOSIS — Z90710 Acquired absence of both cervix and uterus: Secondary | ICD-10-CM | POA: Diagnosis not present

## 2021-11-04 DIAGNOSIS — N184 Chronic kidney disease, stage 4 (severe): Secondary | ICD-10-CM | POA: Diagnosis present

## 2021-11-04 DIAGNOSIS — Z7982 Long term (current) use of aspirin: Secondary | ICD-10-CM | POA: Diagnosis not present

## 2021-11-04 DIAGNOSIS — Z7189 Other specified counseling: Secondary | ICD-10-CM | POA: Diagnosis not present

## 2021-11-04 LAB — ECHOCARDIOGRAM COMPLETE
AR max vel: 2.1 cm2
AV Area VTI: 2.71 cm2
AV Area mean vel: 2.29 cm2
AV Mean grad: 3 mmHg
AV Peak grad: 6.6 mmHg
Ao pk vel: 1.28 m/s
Area-P 1/2: 3.27 cm2
Height: 68 in
MV VTI: 2.85 cm2
S' Lateral: 2.5 cm
Weight: 3460.34 oz

## 2021-11-04 LAB — HEMOGLOBIN A1C
Hgb A1c MFr Bld: 6.9 % — ABNORMAL HIGH (ref 4.8–5.6)
Mean Plasma Glucose: 151.33 mg/dL

## 2021-11-04 LAB — LIPID PANEL
Cholesterol: 95 mg/dL (ref 0–200)
HDL: 33 mg/dL — ABNORMAL LOW (ref >40)
LDL Cholesterol: 40 mg/dL (ref 0–99)
Total CHOL/HDL Ratio: 2.9 RATIO
Triglycerides: 109 mg/dL (ref <150)
VLDL: 22 mg/dL (ref 0–40)

## 2021-11-04 LAB — RESP PANEL BY RT-PCR (FLU A&B, COVID) ARPGX2
Influenza A by PCR: NEGATIVE
Influenza B by PCR: NEGATIVE
SARS Coronavirus 2 by RT PCR: NEGATIVE

## 2021-11-04 LAB — GLUCOSE, CAPILLARY
Glucose-Capillary: 75 mg/dL (ref 70–99)
Glucose-Capillary: 94 mg/dL (ref 70–99)
Glucose-Capillary: 80 mg/dL (ref 70–99)
Glucose-Capillary: 89 mg/dL (ref 70–99)

## 2021-11-04 MED ORDER — GADOBUTROL 1 MMOL/ML IV SOLN
7.5000 mL | Freq: Once | INTRAVENOUS | Status: AC | PRN
  Administered 2021-11-04: 7.5 mL via INTRAVENOUS

## 2021-11-04 NOTE — Evaluation (Signed)
Clinical/Bedside Swallow Evaluation ?Patient Details  ?Name: Amber Fields ?MRN: [id number] ?Date of Birth: February 09, 1933 ? ?Today's Date: 11/04/2021 ?Time: SLP Start Time (ACUTE ONLY): 0940 SLP Stop Time (ACUTE ONLY): 1010 ?SLP Time Calculation (min) (ACUTE ONLY): 30 min ? ?Past Medical History:  ?Past Medical History:  ?Diagnosis Date  ? Cancer [facility])   ? skin ca  ? Chronic kidney disease   ? Diabetes mellitus without complication ([place])   ? Hyperparathyroidism ([place])   ? Hypertension   ? Transient cerebral ischemia   ? ?Past Surgical History:  ?Past Surgical History:  ?Procedure Laterality Date  ? ABDOMINAL HYSTERECTOMY    ? CATARACT EXTRACTION W/PHACO Right 04/27/2021  ? Procedure: CATARACT EXTRACTION PHACO AND INTRAOCULAR LENS PLACEMENT ([place]) RIGHT DIABETIC OMIDRIA 8.64 00:52.6;  Surgeon: Birder Robson, MD;  Location: [place];  Service: Ophthalmology;  Laterality: Right;  covid + 02-10-21  ? CATARACT EXTRACTION W/PHACO Left 05/11/2021  ? Procedure: CATARACT EXTRACTION PHACO AND INTRAOCULAR LENS PLACEMENT ([place]) LEFT DIABETIC OMIDRIA 12.32 01:31.4 ;  Surgeon: Birder Robson, MD;  Location: [place];  Service: Ophthalmology;  Laterality: Left;  ? ?HPI:  ?Amber Fields is a 88 y.o. female with medical history significant for DM, HTN, hearing impairment, CKD 4, history of TIA in January 2021, who walks independently, who presents to the ED for evaluation of 2 days of weakness of extremities, with inability to walk and inability to raise arms.  She also has reported slurred speech and difficulty swallowing.  Patient was seen at the [facility] ED on 3/28 and had a work-up that included MRI and MRA of the brain with no acute findings and she was discharged home.  She was brought back to the emergency room due to persistent, not improving symptoms.  Patient states she had a cough for the past 2 weeks which has improved but since then has had a dry cough and difficulty swallowing.  ED course:  BP 144/74 in the ED with otherwise normal vitals.  Blood work significant for WBC 13,000, platelets 5 44,000, creatinine 1.68 which is at baseline.  CK1 109.  CBC and CMP otherwise unremarkable.  Imaging studies were not repeated.  EKG, personally viewed and interpreted: NSR at 72 with no acute ST-T wave changes. Pt currently NPO. Pt and pt's daughter present for evaluation.  ?  ?Assessment / Plan / Recommendation  ?Clinical Impression ? Pt alert, resting in bed and agreeable to bedside swallow assessment. Pt with reduced articulation precision and slowed rate of speech, which daughter/pt report recently changed. Pt with congested cough at baseline, which occurred t/o session with and in the absence of PO intake.  ? ?Pt presents with suspected oropharyngeal dysphagia c/b intermittent s/sx of aspiration across trials and reduced oral manipulation/mastication. Pt with most consistent immediate cough/throat clear for thin liquid/ice chips. Increased frequency for s/sx of aspiration (wet vocal quality, groping pattern for swallow initiation, and increased WOB) with repeated trials of thickened liquids and soft solids/puree. Question impact of fatigue on swallow safety. Globus sensation reported with thin liquids and solids, aided by cued second swallow.  ? ?Given inconsistent s/sx of aspiration across consistencies, significant fatigue with PO trials, and family report of challenges with intake for past few days. Recommend continued NPO with MBSS to be completed next day as able. MD and RN aware of recommendations. SLP will follow up for further cognitive communication assessment as indicated. ?SLP Visit Diagnosis: Dysphagia, oropharyngeal phase (R13.12) ?   ?Aspiration Risk ? Moderate aspiration risk  ?  ?  Diet Recommendation   NPO ? ?Medication Administration: Via alternative means  ?  ?Other  Recommendations Oral Care Recommendations: Patient independent with oral care;Oral care QID   ? ?Recommendations for follow up  therapy are one component of a multi-disciplinary discharge planning process, led by the attending physician.  Recommendations may be updated based on patient status, additional functional criteria and insurance authorization. ? ?Follow up Recommendations  (tbd)  ? ? ?  ?Assistance Recommended at Discharge    ?Functional Status Assessment Patient has had a recent decline in their functional status and demonstrates the ability to make significant improvements in function in a reasonable and predictable amount of time.  ?Frequency and Duration    ?  ?  ?   ? ?Prognosis Prognosis for Safe Diet Advancement: Fair ?Barriers to Reach Goals: Severity of deficits  ? ?  ? ?Swallow Study   ?General Date of Onset: 11/04/21 ?HPI: Amber Fields is a 88 y.o. female with medical history significant for DM, HTN, hearing impairment, CKD 4, history of TIA in January 2021, who walks independently, who presents to the ED for evaluation of 2 days of weakness of extremities, with inability to walk and inability to raise arms.  She also has reported slurred speech and difficulty swallowing.  Patient was seen at the [facility] ED on 3/28 and had a work-up that included MRI and MRA of the brain with no acute findings and she was discharged home.  She was brought back to the emergency room due to persistent, not improving symptoms.  Patient states she had a cough for the past 2 weeks which has improved but since then has had a dry cough and difficulty swallowing.  ED course: BP 144/74 in the ED with otherwise normal vitals.  Blood work significant for WBC 13,000, platelets 5 44,000, creatinine 1.68 which is at baseline.  CK1 109.  CBC and CMP otherwise unremarkable.  Imaging studies were not repeated.  EKG, personally viewed and interpreted: NSR at 72 with no acute ST-T wave changes. Pt currently NPO. Pt and pt's daughter present for evaluation. ?Type of Study: Bedside Swallow Evaluation ?Previous Swallow Assessment: Previous MBSS in 2017-  swallow function WFL at that time. ?Diet Prior to this Study: NPO ?Temperature Spikes Noted: No (WBC 12.8) ?Respiratory Status: Room air ?History of Recent Intubation: No ?Behavior/Cognition: Cooperative;Alert;Pleasant mood ?Oral Cavity Assessment: Dry ?Oral Care Completed by SLP: Yes ?Oral Cavity - Dentition: Dentures, top (natural teeth lowers) ?Vision: Functional for self-feeding ?Self-Feeding Abilities: Needs assist ?Patient Positioning: Upright in bed ?Baseline Vocal Quality: Low vocal intensity ?Volitional Cough: Congested ?Volitional Swallow: Able to elicit  ?  ?Oral/Motor/Sensory Function Overall Oral Motor/Sensory Function: Mild impairment ?Facial ROM: Within Functional Limits ?Lingual ROM: Other (Comment) (bilateral reduced lateralization) ?Lingual Strength: Reduced ?Velum: Within Functional Limits ?Mandible: Within Functional Limits   ?Ice Chips Ice chips: Impaired ?Presentation: Cup ?Oral Phase Impairments:  (grossly WFL) ?Oral Phase Functional Implications: Prolonged oral transit ?Pharyngeal Phase Impairments: Suspected delayed Swallow;Multiple swallows;Cough - Delayed;Throat Clearing - Delayed   ?Thin Liquid Thin Liquid: Impaired ?Presentation: Straw ?Oral Phase Impairments: Reduced labial seal ?Oral Phase Functional Implications: Prolonged oral transit ?Pharyngeal  Phase Impairments: Suspected delayed Swallow;Multiple swallows;Wet Vocal Quality;Cough - Immediate;Throat Clearing - Immediate  ?  ?Nectar Thick Nectar Thick Liquid: Impaired ?Presentation: Cup;Straw ?Oral Phase Impairments: Reduced labial seal ?Oral phase functional implications: Prolonged oral transit ?Pharyngeal Phase Impairments: Throat Clearing - Delayed;Wet Vocal Quality   ?Honey Thick Honey Thick Liquid: Not tested   ?Puree Puree:  Impaired ?Presentation: Spoon ?Oral Phase Impairments: Reduced lingual movement/coordination ?Oral Phase Functional Implications: Prolonged oral transit ?Pharyngeal Phase Impairments: Unable to trigger  swallow;Wet Vocal Quality (groping noted)   ?Solid ? ? ?  Solid: Impaired ?Presentation: Self Fed ?Oral Phase Impairments: Impaired mastication ?Oral Phase Functional Implications: Impaired mastication;P

## 2021-11-04 NOTE — Assessment & Plan Note (Addendum)
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  Does have some spinal stenosis on MRI of the cervical spine but not at a high enough level to cause the dysarthria. ? ?

## 2021-11-04 NOTE — Assessment & Plan Note (Addendum)
BMI 33.62 ?

## 2021-11-04 NOTE — Consult Note (Signed)
Neurology Consultation ?Reason for Consult: Arm weakness ?Referring Physician: Leslye Peer, R ? ?CC: Arm weakness ? ?History is obtained from:patient ? ?HPI: Amber Fields is a 88 y.o. female with a history of hypertension, diabetes who presents with relatively abrupt onset bilateral upper extremity weakness.  She was in her normal state of health until 2:30 AM on Monday evening at which point she noticed bilateral upper extremity weakness.  The onset was relatively abrupt, but she has had some continued progression since then.  She also noticed dysarthria, which has also gotten progressively worse.  ? ? ?ROS: A 14 point ROS was performed and is negative except as noted in the HPI.  ? ?Past Medical History:  ?Diagnosis Date  ? Cancer [facility])   ? skin ca  ? Chronic kidney disease   ? Diabetes mellitus without complication ([place])   ? Hyperparathyroidism ([place])   ? Hypertension   ? Transient cerebral ischemia   ? ? ? ?Family History  ?Problem Relation Age of Onset  ? Breast cancer Cousin   ?     mat cousin  ? ? ? ?Social History:  reports that she has never smoked. She has never used smokeless tobacco. No history on file for alcohol use and drug use. ? ? ?Exam: ?Current vital signs: ?BP (!) 136/54 (BP Location: Right Arm)   Pulse 74   Temp 98 ?F (36.7 ?C)   Resp 16   Ht '5\' 8"'$  (1.727 m)   Wt 98.1 kg   SpO2 93%   BMI 32.88 kg/m?  ?Vital signs in last 24 hours: ?Temp:  [97.5 ?F (36.4 ?C)-98.6 ?F (37 ?C)] 98 ?F (36.7 ?C) (03/30 1121) ?Pulse Rate:  [63-76] 74 (03/30 1121) ?Resp:  [10-20] 16 (03/30 1121) ?BP: (119-162)/(47-94) 136/54 (03/30 1121) ?SpO2:  [93 %-97 %] 93 % (03/30 1121) ?Weight:  [98.1 kg] 98.1 kg (03/29 2201) ? ? ?Physical Exam  ?Constitutional: Appears well-developed and well-nourished.  ?Psych: Affect appropriate to situation ?Eyes: No scleral injection ?HENT: No OP obstruction ?MSK: no joint deformities.  ?Cardiovascular: Normal rate and regular rhythm.  ?Respiratory: Effort normal, non-labored  breathing ?GI: Soft.  No distension. There is no tenderness.  ?Skin: WDI ? ?Neuro: ?Mental Status: ?Patient is awake, alert, oriented to person, place, month, year, and situation. ?Patient is able to give a clear and coherent history. ?No signs of aphasia or neglect ?Cranial Nerves: ?II: Visual Fields are full. Pupils are equal, round, and reactive to light.   ?III,IV, VI: EOMI without ptosis or diploplia. No ptosis or diplopia on sustained upgaze.  ?V: Facial sensation is symmetric to temperature ?VII: Facial movement is symmetric.  ?VIII: hearing is intact to voice ?X: Uvula elevates symmetrically ?XI: Shoulder shrug is symmetric. ?XII: tongue is midline without atrophy or fasciculations.  ?Motor: ?Tone is normal. Bulk is normal. 2/5 proximally in bilateral UE, 4/5 distally, 4/5 BLE ?Sensory: ?Sensation is symmetric to light touch and temperature in the arms and legs. Mild stocking loss to LT. Vibration diminished at ankles, absent at toes.  ?Deep Tendon Reflexes: ?Absent throughout.  ?Plantars: ?Toes are downgoing bilaterally.  ?Cerebellar: ?Unable to perform FNF ? ? ? ? ? ?I have reviewed labs in epic and the results pertinent to this consultation are: ?Cr 1.68 ?CK 109 ? ?I have reviewed the images obtained: MRI brain - negative.  ?MRI c-spine - some stenosis, but nothing to explain dysarthria.  ? ?Impression: 88 yo F with bilateral upper extremity weakness and bulbar dysfunction(dysarthria and dysphagia). I initally  requested C-spine and brain MRI, but do not feel that these findings can expain her symptoms. 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  ? ?Recommendations: ?1) LP for cells, glucose, protein(will perform tomorrow) ?2) Check magnesium. ?3) NIF and FVC ?4) will follow.  ? ? ? ?Roland Rack, MD ?[facility] ?[phone] ? ?If 7pm- 7am, please page neurology on call as listed in [place]. ? ?

## 2021-11-04 NOTE — Assessment & Plan Note (Deleted)
Resulted postadmission ?Patient had a cough that started 2 weeks prior so outside of window for treatment.  Now symptomatic for dry throat ?Supportive care and airborne precautions ?No antivirals ordered at this time ?

## 2021-11-04 NOTE — Assessment & Plan Note (Addendum)
Holding Lipitor and on sliding scale at this point. ?

## 2021-11-04 NOTE — Progress Notes (Signed)
*  PRELIMINARY RESULTS* ?Echocardiogram ?2D Echocardiogram has been performed. ? ?Ardean Simonich, [place] ?11/04/2021, 9:06 AM ?

## 2021-11-04 NOTE — Progress Notes (Signed)
PT Cancellation Note ? ?Patient Details ?Name: Amber Fields ?MRN: [id number] ?DOB: July 07, 1933 ? ? ?Cancelled Treatment:    Reason Eval/Treat Not Completed: Patient not medically ready. Neurologist leaving room upon arrival. Reports patient to have C spine imaging and MRI. Will hold until results reviewed.  ? ? ?Lucyann Romano ?11/04/2021, 11:44 AM ?

## 2021-11-04 NOTE — Progress Notes (Signed)
?Progress Note ? ? ?[name] RFF:[id number] DOB: 1932/12/26 DOA: 11/01/2021     0 ?DOS: the patient was seen and examined on 11/04/2021 ?  ? ? ?Assessment and Plan: ?* Acute focal neurological deficit ?Patient with bilateral muscle weakness with grip strength and also shoulder strength.  Patient with weakness of the legs and also with trouble swallowing and slurred speech.  Case discussed with neurology and we will get an MRI of the brain and cervical spine with and without contrast for further evaluation.  If negative may end up needing a lumbar puncture.  Modified barium swallow test for tomorrow.  Currently n.p.o. ? ? ?Weakness ?MRI of the cervical spine and brain.  PT and OT consultations if MRI of the cervical spine negative. ? ? ?Type 2 diabetes mellitus with hyperlipidemia ([place]) ?Lipitor once able to take.  Patient on sliding scale insulin ? ?History of TIA  08/2019(transient ischemic attack) ?Aspirin atorvastatin once able to swallow. ? ?CKD (chronic kidney disease) stage 4, GFR 15-29 ml/min (HCC) ?Creatinine a little better with IV fluids.  Creatinine down to 1.58 and GFR of 32 today ? ? ?Obesity with a BMI of 32.88 ? ?Essential hypertension on lisinopril if able to take ? ? ? ? ?  ? ?Subjective: Patient stated that she has had some difficulty speaking and difficulty moving her arms and unable to walk.  She went to [facility] and had an MRI of the brain and MRA of the brain which came back negative.  She recently had a steroid dose for bronchitis.  Patient with very poor grip strength.  Did not do well with the swallow evaluation today. ? ?Physical Exam: ?Vitals:  ? 10/24/2021 2201 11/04/21 0636 11/04/21 0814 11/04/21 1121  ?BP: (!) 162/66 (!) 150/67 (!) 119/94 (!) 136/54  ?Pulse: 76 63 65 74  ?Resp: '20 20 18 16  '$ ?Temp: 97.8 ?F (36.6 ?C) (!) 97.5 ?F (36.4 ?C) 98.6 ?F (37 ?C) 98 ?F (36.7 ?C)  ?TempSrc: Oral Oral    ?SpO2: 94% 96% 96% 93%  ?Weight: 98.1 kg     ?Height: '5\' 8"'$  (1.727 m)      ? ?Physical Exam ?HENT:  ?   Head: Normocephalic.  ?   Mouth/Throat:  ?   Pharynx: No oropharyngeal exudate.  ?Eyes:  ?   General: Lids are normal.  ?   Conjunctiva/sclera: Conjunctivae normal.  ?Cardiovascular:  ?   Rate and Rhythm: Normal rate and regular rhythm.  ?   Heart sounds: Normal heart sounds, S1 normal and S2 normal.  ?Pulmonary:  ?   Breath sounds: Examination of the right-lower field reveals decreased breath sounds. Examination of the left-lower field reveals decreased breath sounds. Decreased breath sounds present. No wheezing, rhonchi or rales.  ?Abdominal:  ?   Palpations: Abdomen is soft.  ?   Tenderness: There is no abdominal tenderness.  ?Musculoskeletal:  ?   Right lower leg: No swelling.  ?   Left lower leg: No swelling.  ?Skin: ?   General: Skin is warm.  ?   Findings: No rash.  ?Neurological:  ?   Mental Status: She is alert and oriented to person, place, and time.  ?   Comments: Some slurred speech.  Able to straight leg raise.  Weakness with bilateral grip strength and upper arms.  When I lifted up her arms and just flopped right back down to the bed.  ?  ?Data Reviewed: ?LDL 40, hemoglobin A1c 6.9, creatinine 1.58 ? ?Family Communication:  Spoke with daughter at the bedside ? ?Disposition: ?Status is: Observation ?Getting stat MRI of the cervical spine.  Holding on PT and OT evaluations until results back.  We will get a modified barium swallow tomorrow since did not do well with swallowing today. ? ?Planned Discharge Destination: To be determined based on clinical course ? ? ?Author: ?Loletha Grayer, MD ?11/04/2021 1:50 PM ? ?For on call review [email or web address].  ?

## 2021-11-04 NOTE — Progress Notes (Signed)
OT Cancellation Note ? ?Patient Details ?Name: Amber Fields ?MRN: [id number] ?DOB: 12/21/1932 ? ? ?Cancelled Treatment:    Reason Eval/Treat Not Completed: Medical issues which prohibited therapy. OT order received and chart reviewed. MD requesting hold today pending MRI with concerns for cervical spine. OT will re-attempt after results. ? ?Darleen Crocker, MS, OTR/L , CBIS ?ascom [phone]  ?11/04/21, 11:27 AM  ?

## 2021-11-05 ENCOUNTER — Inpatient Hospital Stay: Payer: Medicare Other

## 2021-11-05 DIAGNOSIS — G610 Guillain-Barre syndrome: Secondary | ICD-10-CM | POA: Diagnosis not present

## 2021-11-05 DIAGNOSIS — E1169 Type 2 diabetes mellitus with other specified complication: Secondary | ICD-10-CM | POA: Diagnosis not present

## 2021-11-05 DIAGNOSIS — R531 Weakness: Secondary | ICD-10-CM | POA: Diagnosis not present

## 2021-11-05 DIAGNOSIS — J9600 Acute respiratory failure, unspecified whether with hypoxia or hypercapnia: Secondary | ICD-10-CM | POA: Diagnosis not present

## 2021-11-05 DIAGNOSIS — E669 Obesity, unspecified: Secondary | ICD-10-CM | POA: Diagnosis not present

## 2021-11-05 LAB — BASIC METABOLIC PANEL
Anion gap: 9 (ref 5–15)
BUN: 25 mg/dL — ABNORMAL HIGH (ref 8–23)
CO2: 22 mmol/L (ref 22–32)
Calcium: 9.2 mg/dL (ref 8.9–10.3)
Chloride: 105 mmol/L (ref 98–111)
Creatinine, Ser: 1.45 mg/dL — ABNORMAL HIGH (ref 0.44–1.00)
GFR, Estimated: 35 mL/min — ABNORMAL LOW (ref >60)
Glucose, Bld: 102 mg/dL — ABNORMAL HIGH (ref 70–99)
Potassium: 4.3 mmol/L (ref 3.5–5.1)
Sodium: 136 mmol/L (ref 135–145)

## 2021-11-05 LAB — GLUCOSE, CAPILLARY
Glucose-Capillary: 100 mg/dL — ABNORMAL HIGH (ref 70–99)
Glucose-Capillary: 99 mg/dL (ref 70–99)
Glucose-Capillary: 96 mg/dL (ref 70–99)
Glucose-Capillary: 107 mg/dL — ABNORMAL HIGH (ref 70–99)
Glucose-Capillary: 120 mg/dL — ABNORMAL HIGH (ref 70–99)

## 2021-11-05 LAB — CSF CELL COUNT WITH DIFFERENTIAL
Eosinophils, CSF: 0 %
Lymphs, CSF: 56 %
Monocyte-Macrophage-Spinal Fluid: 30 %
RBC Count, CSF: 0 /mm3 (ref 0–3)
Segmented Neutrophils-CSF: 14 %
Tube #: 3
WBC, CSF: 5 /mm3 (ref 0–5)

## 2021-11-05 LAB — CBC
HCT: 40.7 % (ref 36.0–46.0)
Hemoglobin: 13.1 g/dL (ref 12.0–15.0)
MCH: 29.4 pg (ref 26.0–34.0)
MCHC: 32.2 g/dL (ref 30.0–36.0)
MCV: 91.3 fL (ref 80.0–100.0)
Platelets: 467 10*3/uL — ABNORMAL HIGH (ref 150–400)
RBC: 4.46 MIL/uL (ref 3.87–5.11)
RDW: 14.2 % (ref 11.5–15.5)
WBC: 14.1 10*3/uL — ABNORMAL HIGH (ref 4.0–10.5)
nRBC: 0 % (ref 0.0–0.2)

## 2021-11-05 LAB — PROTEIN AND GLUCOSE, CSF
Glucose, CSF: 62 mg/dL (ref 40–70)
Total  Protein, CSF: 53 mg/dL — ABNORMAL HIGH (ref 15–45)

## 2021-11-05 LAB — MAGNESIUM: Magnesium: 2 mg/dL (ref 1.7–2.4)

## 2021-11-05 LAB — MRSA NEXT GEN BY PCR, NASAL: MRSA by PCR Next Gen: NOT DETECTED

## 2021-11-05 MED ORDER — ASPIRIN EC 81 MG PO TBEC
81.0000 mg | DELAYED_RELEASE_TABLET | Freq: Every day | ORAL | Status: DC
  Administered 2021-11-06: 81 mg via ORAL
  Filled 2021-11-05: qty 1

## 2021-11-05 MED ORDER — PROPOFOL 1000 MG/100ML IV EMUL
0.0000 ug/kg/min | INTRAVENOUS | Status: DC
  Administered 2021-11-06 – 2021-11-07 (×4): 20 ug/kg/min via INTRAVENOUS
  Filled 2021-11-05 (×5): qty 100

## 2021-11-05 MED ORDER — INSULIN ASPART 100 UNIT/ML IJ SOLN
0.0000 [IU] | INTRAMUSCULAR | Status: DC
  Administered 2021-11-06: 3 [IU] via SUBCUTANEOUS
  Filled 2021-11-05: qty 1

## 2021-11-05 MED ORDER — CHLORHEXIDINE GLUCONATE 0.12% ORAL RINSE (MEDLINE KIT)
15.0000 mL | Freq: Two times a day (BID) | OROMUCOSAL | Status: DC
  Administered 2021-11-05 – 2021-11-16 (×23): 15 mL via OROMUCOSAL

## 2021-11-05 MED ORDER — CHLORHEXIDINE GLUCONATE CLOTH 2 % EX PADS
6.0000 | MEDICATED_PAD | Freq: Every day | CUTANEOUS | Status: DC
  Administered 2021-11-05 – 2021-11-16 (×11): 6 via TOPICAL

## 2021-11-05 MED ORDER — FENTANYL BOLUS VIA INFUSION
25.0000 ug | INTRAVENOUS | Status: DC | PRN
  Administered 2021-11-07: 100 ug via INTRAVENOUS
  Filled 2021-11-05: qty 100

## 2021-11-05 MED ORDER — DOCUSATE SODIUM 50 MG/5ML PO LIQD
100.0000 mg | Freq: Two times a day (BID) | ORAL | Status: DC
  Administered 2021-11-06 – 2021-11-11 (×12): 100 mg
  Filled 2021-11-05 (×12): qty 10

## 2021-11-05 MED ORDER — FENTANYL CITRATE (PF) 100 MCG/2ML IJ SOLN
INTRAMUSCULAR | Status: AC
  Administered 2021-11-05: 100 ug via INTRAVENOUS
  Filled 2021-11-05: qty 2

## 2021-11-05 MED ORDER — IMMUNE GLOBULIN (HUMAN) 20 GM/200ML IV SOLN
400.0000 mg/kg | INTRAVENOUS | Status: AC
  Administered 2021-11-05 – 2021-11-09 (×5): 40 g via INTRAVENOUS
  Filled 2021-11-05 (×5): qty 400

## 2021-11-05 MED ORDER — FENTANYL CITRATE PF 50 MCG/ML IJ SOSY: 25.0000 ug | PREFILLED_SYRINGE | Freq: Once | INTRAMUSCULAR | Status: DC

## 2021-11-05 MED ORDER — MIDAZOLAM HCL 2 MG/2ML IJ SOLN: 4.0000 mg | Freq: Once | INTRAMUSCULAR | Status: AC

## 2021-11-05 MED ORDER — PROPOFOL 1000 MG/100ML IV EMUL
INTRAVENOUS | Status: AC
Start: 2021-11-05 — End: 2021-11-05
  Administered 2021-11-05: 5 ug/kg/min via INTRAVENOUS
  Filled 2021-11-05: qty 100

## 2021-11-05 MED ORDER — FENTANYL 2500MCG IN NS 250ML (10MCG/ML) PREMIX INFUSION
25.0000 ug/h | INTRAVENOUS | Status: DC
  Administered 2021-11-05: 50 ug/h via INTRAVENOUS
  Administered 2021-11-06: 100 ug/h via INTRAVENOUS
  Filled 2021-11-05: qty 250

## 2021-11-05 MED ORDER — ETOMIDATE 2 MG/ML IV SOLN: 20.0000 mg | Freq: Once | INTRAVENOUS | Status: AC

## 2021-11-05 MED ORDER — ORAL CARE MOUTH RINSE
15.0000 mL | OROMUCOSAL | Status: DC
  Administered 2021-11-05 – 2021-11-15 (×95): 15 mL via OROMUCOSAL

## 2021-11-05 MED ORDER — POLYETHYLENE GLYCOL 3350 17 G PO PACK
17.0000 g | PACK | Freq: Every day | ORAL | Status: DC
  Administered 2021-11-06 – 2021-11-11 (×6): 17 g
  Filled 2021-11-05 (×6): qty 1

## 2021-11-05 MED ORDER — FENTANYL CITRATE (PF) 100 MCG/2ML IJ SOLN: 100.0000 ug | Freq: Once | INTRAMUSCULAR | Status: AC

## 2021-11-05 MED ORDER — FENTANYL 2500MCG IN NS 250ML (10MCG/ML) PREMIX INFUSION
INTRAVENOUS | Status: AC
  Administered 2021-11-05: 25 ug/h via INTRAVENOUS
  Filled 2021-11-05: qty 250

## 2021-11-05 MED ORDER — ETOMIDATE 2 MG/ML IV SOLN
INTRAVENOUS | Status: AC
  Administered 2021-11-05: 20 mg
  Filled 2021-11-05: qty 10

## 2021-11-05 MED ORDER — MIDAZOLAM HCL 2 MG/2ML IJ SOLN
INTRAMUSCULAR | Status: AC
  Administered 2021-11-05: 4 mg via INTRAVENOUS
  Filled 2021-11-05: qty 4

## 2021-11-05 MED ORDER — SODIUM CHLORIDE 0.9 % IV BOLUS
150.0000 mL | Freq: Once | INTRAVENOUS | Status: AC
Start: 2021-11-05 — End: 2021-11-05
  Administered 2021-11-05: 150 mL via INTRAVENOUS

## 2021-11-05 MED ORDER — ENOXAPARIN SODIUM 40 MG/0.4ML IJ SOSY
40.0000 mg | PREFILLED_SYRINGE | INTRAMUSCULAR | Status: DC
  Administered 2021-11-05 – 2021-11-06 (×2): 40 mg via SUBCUTANEOUS
  Filled 2021-11-05 (×2): qty 0.4

## 2021-11-05 NOTE — Progress Notes (Signed)
Subjective: ?More trouble speaking.  ? ?Exam: ?Vitals:  ? 11/05/21 1100 11/05/21 1200  ?BP: (!) 168/79 (!) 161/144  ?Pulse: 89 93  ?Resp: 16 (!) 22  ?Temp:    ?SpO2: 96% 96%  ? ?Gen: In bed, NAD ?Resp: non-labored breathing, no acute distress ?Abd: soft, nt ? ?Neuro: ?MS: awake, alert, interactive and appropriate.  ?CN: PERRL, no ptosis, she does have elevation of her palate and symmetric tongue protrusion.  ?Motor: she has worsening weakness, with the hands still 4/5 bilaterally, but weaker than yesterday, biceps is 4/5.  Neck flexion 2/5 ?Sensory:intact to LT< diminished to vibration at the knees and ankles.  ? ?Pertinent Labs: ?CSF protein 53 ?CSF glucose 62 ?CSF RBC 0  ?CSF WBC 5 ?CSF ganglioside antibodies - pending.  ? ? ?Impression: 88 year old female with progressive upper extremity weakness and pharyngeal dysfunction.  I do not think that her cervical stenosis would explain her progressive bulbar dysfunction, and she does appear worse than yesterday.  Her strength exam and bulbar exam are worsening, and at this point I expect that she is likely to need intubation in the next day or so. ? 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 ? ?Recommendations: ?1) IVIG 400 mg/kg daily for 5 days for a total of 2 g/kg ?2) she has been moved to intensive care for close monitoring ?3) appreciate CCM involvement ?4) q6h nif/fvc ?5) neurology will continue to follow ? ?Roland Rack, MD ?[facility] ?[phone] ? ?If 7pm- 7am, please page neurology on call as listed in [place]. ? ?

## 2021-11-05 NOTE — Progress Notes (Signed)
Speech Language Pathology Treatment: Dysphagia  ?Patient Details ?Name: Amber Fields ?MRN: [id number] ?DOB: 1933/04/29 ?Today's Date: 11/05/2021 ?Time: 7342-8768 ?SLP Time Calculation (min) (ACUTE ONLY): 25 min ? ?Assessment / Plan / Recommendation ?Clinical Impression ? Pt seen for ongoing dysphagia. Pt has been transferred to ICU d/t decline. At bedside pt displays significantly worsened oral motor movements with speech intelligibility ~ 25% at the sentence level. Skilled observation was provided of pt consuming ice chips. Pt with little lingual manipulation of ice chips and no movement felt when palpating pt's hyoid. Audible weakness could be heard and felt at neck. When cued, pt coughed multiple times with suction provided. At bedside, pt doesn't appear to be protecting her airway and continue to recommend STRICT NPO with diligent oral care via suction toothbrush. This information was provided to pt's daughter at bedside as well as pt's current attending (Dr Leslye Peer), pt's nurse and critical care doctor.  ? ?MBSS was cancelled for this afternoon. Please re-consult ST should pt become appropriate for PO trials.  ?  ?HPI HPI: Amber Fields is a 88 y.o. female with medical history significant for DM, HTN, hearing impairment, CKD 4, history of TIA in January 2021, who walks independently, who presents to the ED for evaluation of 2 days of weakness of extremities, with inability to walk and inability to raise arms.  She also has reported slurred speech and difficulty swallowing.  Patient was [facility] ED on 3/28 and had a work-up that included MRI and MRA of the brain with no acute findings and she was discharged home.  She was brought back to the emergency room due to persistent, not improving symptoms.  Patient states she had a cough for the past 2 weeks which has improved but since then has had a dry cough and difficulty swallowing.  ED course: BP 144/74 in the ED with otherwise normal  vitals.  Blood work significant for WBC 13,000, platelets 5 44,000, creatinine 1.68 which is at baseline.  CK1 109.  CBC and CMP otherwise unremarkable.  Imaging studies were not repeated.  EKG, personally viewed and interpreted: NSR at 72 with no acute ST-T wave changes. Pt currently NPO. Pt and pt's daughter present for evaluation. ?  ?   ?SLP Plan ? Continue with current plan of care ? ?  ?  ?Recommendations for follow up therapy are one component of a multi-disciplinary discharge planning process, led by the attending physician.  Recommendations may be updated based on patient status, additional functional criteria and insurance authorization. ?  ? ?Recommendations  ?Diet recommendations: NPO ?Medication Administration: Via alternative means  ?   ?    ?   ? ? ? ? Oral Care Recommendations: Oral care QID ?Follow Up Recommendations: Follow physician's recommendations for discharge plan and follow up therapies ?Assistance recommended at discharge: Frequent or constant Supervision/Assistance ?SLP Visit Diagnosis: Dysphagia, oropharyngeal phase (R13.12) ?Plan: Continue with current plan of care ? ? ? ? ?  ?  ?Amauris Debois B. Rutherford Nail, M.S., CCC-SLP, CBIS ?Speech-Language Pathologist ?Rehabilitation Services ?Office [phone] ? ? ?Benjamin Casanas ? ?11/05/2021, 12:46 PM ?

## 2021-11-05 NOTE — Progress Notes (Signed)
OT Cancellation Note ? ?Patient Details ?Name: Amber Fields ?MRN: [id number] ?DOB: November 21, 1932 ? ? ?Cancelled Treatment:    Reason Eval/Treat Not Completed: Medical issues which prohibited therapy. Pt transferred to ICU secondary to higher level of need/change in status. OT to SIGN OFF at this time. Please re-consult when pt is able to actively participate in therapeutic intervention.  ? ?Darleen Crocker, MS, OTR/L , CBIS ?ascom [phone]  ?11/05/21, 10:26 AM  ?

## 2021-11-05 NOTE — Progress Notes (Signed)
Called bedside by nursing due to respiratory concerns. Patient found with white/clear secretions running down her chin onto her chest. Upon bedside assessment, patient unable to effectively clear her secretions with cough and audible gurgling when speaking. ?Discussed concern for aspiration and impending respiratory failure due to progressive weakness and inability to manage secretions.  ?The procedure was explained to the patient who is amenable to intubation and mechanical ventilatory support. Called and spoke with Stan Head, the patient's daughter. All questions and concerns answered at this time. ? ?Acute Respiratory Failure secondary to muscle weakness and inability to manage oral secretions in the setting of suspected Guillain Barre ?Possible Aspiration ?Due to the progression of illness, called and spoke with Dr. Patsey Berthold & Dr. Lorrin Goodell with neurology at [facility] to determine if transfer to [facility] for plasmapheresis was needed. Transfer not needed as IVIG and plasmapheresis have similar outcomes, also now that she has received IVIG those antibodies would be removed with subsequent plasmapheresis. Will maintain supportive care and IVIG treatment plan per Neurology's recommendations ?- Ventilator settings: PRVC  7 mL/kg, 35% FiO2, 5 PEEP, continue ventilator support & lung protective strategies ?- Wean PEEP & FiO2 as tolerated, maintain SpO2 > 90% ?- Head of bed elevated 30 degrees, VAP protocol in place ?- Plateau pressures less than 30 cm H20  ?- Intermittent chest x-ray & ABG PRN ?- Daily WUA with SBT as tolerated  ?- Ensure adequate pulmonary hygiene  ?- concern for aspiration > f/u CXR, trend PCT, initiate antibiotic coverage if needed ?- bronchodilators PRN ?- PAD protocol in place: continue Fentanyl drip & Propofol drip ? ? ?Domingo Pulse Rust-Chester, AGACNP-BC ?Acute Care Nurse Practitioner ?[facility]  ? ?[phone] / [phone] ?Please see Amion for pager details.  ? ? ? ?

## 2021-11-05 NOTE — Assessment & Plan Note (Signed)
Patient with worsening symptoms today.  Still with dysarthria and dysphagia and upper arm weakness.  Patient spitting out her saliva.  Failed bedside swallow evaluation.  With the patient's NIF being -14 transfer to the ICU for closer monitoring of respiratory status.  Case discussed with critical care team.  Neurology performed a lumbar puncture and if protein is high then we may start IVIG. ?

## 2021-11-05 NOTE — Procedures (Signed)
Intubation Procedure Note ? ?Elta Angell Lantigua  ?[id number]  ?[birth year] ? ?Date:11/05/21  ?Time:8:30 PM  ? ?Provider Performing:Mireya Meditz L Rust-Chester  ? ? ?Procedure: Intubation (29562) ? ?Indication(s) ?Respiratory Failure ? ?Consent ?Risks of the procedure as well as the alternatives and risks of each were explained to the patient and/or caregiver.  Consent for the procedure was obtained and is signed in the bedside chart ? ? ?Anesthesia ?Etomidate, Versed, and Fentanyl ? ? ?Time Out ?Verified patient identification, verified procedure, site/side was marked, verified correct patient position, special equipment/implants available, medications/allergies/relevant history reviewed, required imaging and test results available. ? ? ?Sterile Technique ?Usual hand hygeine, masks, and gloves were used ? ? ?Procedure Description ?Patient positioned in bed supine.  Sedation given as noted above.  Patient was intubated with endotracheal tube using Glidescope.  View was Grade 1 full glottis .  Number of attempts was 1.  Colorimetric CO2 detector was consistent with tracheal placement. ? ? ?Complications/Tolerance ?None; patient tolerated the procedure well. ?Chest X-ray is ordered to verify placement. ? ? ?EBL ?Minimal ? ? ?Specimen(s) ?None ? ? ?Domingo Pulse Rust-[place]ster, AGACNP-BC ?Acute Care Nurse Practitioner ?[facility]  ? ?[phone] / [phone] ?Please see Amion for pager details.  ? ?

## 2021-11-05 NOTE — Progress Notes (Signed)
Pt intubated by NP & vent initiated for airway protection. Pt tolerating well currently ?

## 2021-11-05 NOTE — Progress Notes (Signed)
PT Cancellation Note ? ?Patient Details ?Name: Amber Fields ?MRN: [id number] ?DOB: [birth year] ? ? ?Cancelled Treatment:    Reason Eval/Treat Not Completed: Patient not medically ready;Medical issues which prohibited therapy. Patient transferred to ICU. Will await new/continuation order as appropriate.  ? ? ?Derrin Currey ?11/05/2021, 10:48 AM ?

## 2021-11-05 NOTE — Procedures (Signed)
Indication: Concern for guillian barre syndrome ? ?Risks of the procedure were dicussed with the patient including post-LP headache, bleeding, infection, weakness/numbness of legs(radiculopathy), death.  The patient agreed and written consent was obtained.  ? ?The patient was prepped and draped, and using sterile technique a 20 gauge quinke spinal needle was inserted in the L4-5 space. The opening pressure was not measured. Approximately 10 cc of CSF were obtained and sent for analysis.  ? ?She tolerated the procedure well without complication. ? ?Roland Rack, MD ?[facility] ?[phone] ? ?If 7pm- 7am, please page neurology on call as listed in [place]. ? ?

## 2021-11-05 NOTE — Consult Note (Signed)
? ?NAME:  Amber Fields, MRN:  [id number], DOB:  06/11/1933, LOS: 1 ?ADMISSION DATE:  11/02/2021, CONSULTATION DATE:  11/05/2021 ?REFERRING MD:  Dr. Leslye Peer, CHIEF COMPLAINT:  Upper extremity weakness, dysarthria/dysphagia  ? ?Brief Pt Description / Synopsis:  ?88 y.o. 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  Transferred to [facility] 3/31 with PCCM consulted to follow respiratory status and ability to protect her airway. ? ?History of Present Illness:  ?Amber Fields is a 88 year old female with a past medical history as listed below who presented to [facility] ED on 10/28/2021 due to complaints of abrupt onset of bilateral upper extremity weakness. ? ?She was in her normal state of health until 2:30 AM on Monday evening, where she noticed bilateral upper extremity weakness, along with dysarthria.  Both of these have progressively worsened. ? ?Of note she was seen at [facility] on 3/28 and had workup including MRI and MRA of the brain which showed no acute findings, thus was discharged home. ? ?She was admitted by the hospitalist with Neurology consultation.  Concern for cervical-pharyngeal-brachial variant of guillian-barre syndrome. ? ?On 3/31, she was noted to have increased spitting of oral secretions, and failed her swallow evaluation.  NIF was 14.  She was transferred to [facility] with PCCM consultation for close monitoring of respiratory status.  ? ?Pertinent  Medical History  ?Chronic kidney disease Stage 4 ?Diabetes mellitus ?Hyperparathyroidism ?Hypertension ?TIA ? ?Micro Data:  ?3/30: SARS-CoV-2 & Influenza PCR>>negative ?3/31: MRSA PCR>>negative ? ?Antimicrobials:  ?N/A ? ?Significant Hospital Events: ?Including procedures, antibiotic start and stop dates in addition to other pertinent events   ?3/29: Presented to ED in evening.  Admitted by Hospitalist.  Neurology consulted ?3/30: Seen by Neurology, concern for Guillain-barre syndrome given clinical  presentation. ?3/31:  Pt with continued dysarthria/dysphagia.  Spitting out saliva, failed bedside swallow evaluation.  Transferred to Stepdown with PCCM consultation to closely follow respiratory status ? ?Interim History / Subjective:  ?-Pt with continued dysarthria/dysphagia ?-Noted to be spitting out saliva, failed bedside swallow evaluation.  ?-NIF 14 ? Transferred to Stepdown with PCCM consultation to closely follow respiratory status ?-Upon evaluation, currently protecting her airway ?-Hemodynamically stable, on room air ?-Neuro to perform LP today ? ?Objective   ?Blood pressure (!) 158/83, pulse 89, temperature 97.8 ?F (36.6 ?C), temperature source Oral, resp. rate (!) 30, height '5\' 8"'$  (1.727 m), weight 100.3 kg, SpO2 93 %. ?   ?   ? ?Intake/Output Summary (Last 24 hours) at 11/05/2021 1150 ?Last data filed at 11/05/2021 [phone] ?Gross per 24 hour  ?Intake 1380.86 ml  ?Output 950 ml  ?Net 430.86 ml  ? ?Filed Weights  ? 10/22/2021 1400 10/31/2021 2201 11/05/21 1030  ?Weight: 99.8 kg 98.1 kg 100.3 kg  ? ? ?Examination: ?General: Acutely ill-appearing female, sitting in bed, on room air, no acute distress ?HENT: Atraumatic, normocephalic, neck supple, no JVD ?Lungs: Clear breath sounds bilaterally, no rales or wheezing noted, even, nonlabored ?Cardiovascular: Regular rate and rhythm, S1-S2, no murmurs, rubs, gallops ?Abdomen: Soft, nontender, nondistended, no guarding rebound tenderness, bowel sounds positive x4 ?Extremities: No cyanosis or clubbing, no edema ?Neuro: Weakness to bilateral arms, able to move her arms from the elbows down, unable to lift arms from the elbow up, moves bilateral lower extremities to command ?GU: Deferred ? ?Resolved Hospital Problem list   ? ? ?Assessment & Plan:  ? ?Suspected Guillain Barr? syndrome ?-Neurology following, appreciate input ?-Plan for IVIG ?-LP performed 3/31 ?-NIF/FVC every 6 hours ?-Aggressive pulmonary toilet ?-Stepdown  for close monitoring of respiratory status and  ability to protect airway ? ?Best Practice (right click and "Reselect all SmartList Selections" daily)  ? ?Diet/type: NPO ?DVT prophylaxis: SCD ?GI prophylaxis: N/A ?Lines: N/A ?Foley:  N/A ?Code Status:  full code ?Last date of multidisciplinary goals of care discussion [N/A] ? ?Labs   ?CBC: ?Recent Labs  ?Lab 11/02/2021 ?1411 10/13/2021 ?2136 11/05/21 ?0725  ?WBC 13.3* 12.8* 14.1*  ?NEUTROABS 9.2*  --   --   ?HGB 12.7 12.3 13.1  ?HCT 40.1 38.5 40.7  ?MCV 92.8 91.2 91.3  ?PLT 544* 474* 467*  ? ? ?Basic Metabolic Panel: ?Recent Labs  ?Lab 10/14/2021 ?1411 10/09/2021 ?2136 11/05/21 ?0725  ?NA 138  --  136  ?K 4.4  --  4.3  ?CL 105  --  105  ?CO2 25  --  22  ?GLUCOSE 117*  --  102*  ?BUN 39*  --  25*  ?CREATININE 1.68* 1.58* 1.45*  ?CALCIUM 9.8  --  9.2  ?MG  --   --  2.0  ? ?GFR: ?Estimated Creatinine Clearance: 34.5 mL/min (A) (by C-G formula based on SCr of 1.45 mg/dL (H)). ?Recent Labs  ?Lab 10/11/2021 ?1411 10/24/2021 ?2136 11/05/21 ?0725  ?WBC 13.3* 12.8* 14.1*  ? ? ?Liver Function Tests: ?Recent Labs  ?Lab 11/02/2021 ?1411  ?AST 20  ?ALT 21  ?ALKPHOS 97  ?BILITOT 0.6  ?PROT 7.3  ?ALBUMIN 3.5  ? ?No results for input(s): LIPASE, AMYLASE in the last 168 hours. ?No results for input(s): AMMONIA in the last 168 hours. ? ?ABG ?No results found for: PHART, PCO2ART, PO2ART, HCO3, TCO2, ACIDBASEDEF, O2SAT  ? ?Coagulation Profile: ?Recent Labs  ?Lab 10/29/2021 ?1411  ?INR 1.0  ? ? ?Cardiac Enzymes: ?Recent Labs  ?Lab 10/12/2021 ?1903  ?CKTOTAL 109  ? ? ?HbA1C: ?Hgb A1c MFr Bld  ?Date/Time Value Ref Range Status  ?11/04/2021 05:11 AM 6.9 (H) 4.8 - 5.6 % Final  ?  Comment:  ?  (NOTE) ?Pre diabetes:          5.7%-6.4% ? ?Diabetes:              >6.4% ? ?Glycemic control for   <7.0% ?adults with diabetes ?  ? ? ?CBG: ?Recent Labs  ?Lab 11/04/21 ?9509 11/04/21 ?1123 11/04/21 ?1623 11/04/21 ?2137 11/05/21 ?0814  ?GLUCAP 75 94 80 89 100*  ? ? ?Review of Systems:   ?Difficult to assess due to dysarthria ? ?Past Medical History:  ?She,  has a past  medical history of Cancer ([place]), Chronic kidney disease, Diabetes mellitus without complication ([place]), Hyperparathyroidism ([place]), Hypertension, and Transient cerebral ischemia.  ? ?Surgical History:  ? ?Past Surgical History:  ?Procedure Laterality Date  ? ABDOMINAL HYSTERECTOMY    ? CATARACT EXTRACTION W/PHACO Right 04/27/2021  ? Procedure: CATARACT EXTRACTION PHACO AND INTRAOCULAR LENS PLACEMENT ([place]) RIGHT DIABETIC OMIDRIA 8.64 00:52.6;  Surgeon: Birder Robson, MD;  Location: [place];  Service: Ophthalmology;  Laterality: Right;  covid + 02-10-21  ? CATARACT EXTRACTION W/PHACO Left 05/11/2021  ? Procedure: CATARACT EXTRACTION PHACO AND INTRAOCULAR LENS PLACEMENT ([place]) LEFT DIABETIC OMIDRIA 12.32 01:31.4 ;  Surgeon: Birder Robson, MD;  Location: [place];  Service: Ophthalmology;  Laterality: Left;  ?  ? ?Social History:  ? reports that she has never smoked. She has never used smokeless tobacco.  ? ?Family History:  ?Her family history includes Breast cancer in her cousin.  ? ?Allergies ?Allergies  ?Allergen Reactions  ? Pravastatin Other (See Comments)  ?  Other reaction(s): Dizziness, Muscle Pain  ? Codeine Other (See Comments)  ?  Other reaction(s): Dizziness  ? Ibuprofen Other (See Comments)  ?  Other reaction(s): Dizziness  ? Penicillins   ?  Other reaction(s): Other (See Comments) ?Tongue and facial swelling ?Other reaction(s): Other (See Comments) ?Tongue and facial swelling ?  ? Sulfa Antibiotics Other (See Comments)  ? Lansoprazole Diarrhea  ?  ? ?Home Medications  ?Prior to Admission medications   ?Medication Sig Start Date End Date Taking? Authorizing Provider  ?acetaminophen (TYLENOL) 500 MG tablet Take 500 mg by mouth every 6 (six) hours as needed.   Yes [provider]  ?aspirin 81 MG EC tablet Take 81 mg by mouth daily.   Yes [provider]  ?atorvastatin (LIPITOR) 40 MG tablet Take 40 mg by mouth daily.   Yes [provider]  ?glimepiride  (AMARYL) 4 MG tablet Take 4 mg by mouth daily.   Yes [provider]  ?lisinopril (ZESTRIL) 40 MG tablet Take 40 mg by mouth daily.   Yes [provider]  ?pioglitazone (ACTOS) 15 MG tablet Take 1

## 2021-11-05 NOTE — Progress Notes (Signed)
?Progress Note ? ? ?Patient: Amber Fields:[id number] DOB: 07-23-1933 DOA: 10/22/2021     1 ?DOS: the patient was seen and examined on 11/05/2021 ?  ? 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  Still with dysarthria and dysphagia and upper arm weakness.  Patient spitting out her saliva.  Failed bedside swallow evaluation.  With the patient's NIF being -14 transfer to the ICU for closer monitoring of respiratory status.  Case discussed with critical care team.  Neurology performed a lumbar puncture and if protein is high then we may start IVIG. ? ?Weakness ?Likely Guillain-Barr?.  Does have some spinal stenosis on MRI of the cervical spine but not at a high enough level to cause the dysarthria. ? ? ?Obesity (BMI 30-39.9) ?BMI 33.62 ? ?Type 2 diabetes mellitus with hyperlipidemia ([place]) ?Holding Lipitor and on sliding scale at this point. ? ?History of TIA  08/2019(transient ischemic attack) ?Aspirin atorvastatin once able to swallow. ? ?CKD (chronic kidney disease) stage 4, GFR 15-29 ml/min (HCC) ?Creatinine a little better with IV fluids.  Creatinine down to 1.45 and GFR of 35 today ? ? ? ? ? ?  ? ?Subjective: Patient states that she is 88 years old and did not want to be aggressive with things.  Still having trouble swallowing but wanted to try to eat.  Needed to spit out her own saliva.  Admitted with dysarthria, dysphagia and upper extremity weakness. ? ?Physical Exam: ?Vitals:  ? 11/05/21 0031 11/05/21 0538 11/05/21 0814 11/05/21 1030  ?BP: (!) 151/72 139/68 (!) 158/83 (!) 158/83  ?Pulse: 69 60 68 89  ?Resp:  16 16 (!) 30  ?Temp: 97.6 ?F (36.4 ?C) 97.7 ?F (36.5 ?C) 98.7 ?F (37.1 ?C) 97.8 ?F (36.6 ?C)  ?TempSrc: Oral Oral  Oral  ?SpO2: 94% 94% 94% 93%  ?Weight:    100.3 kg  ?Height:      ? ?Physical Exam ?HENT:  ?   Head: Normocephalic.  ?   Mouth/Throat:  ?   Pharynx: No oropharyngeal exudate.  ?Eyes:  ?   General: Lids are normal.  ?    Conjunctiva/sclera: Conjunctivae normal.  ?Cardiovascular:  ?   Rate and Rhythm: Normal rate and regular rhythm.  ?   Heart sounds: Normal heart sounds, S1 normal and S2 normal.  ?Pulmonary:  ?   Breath sounds: Examination of the right-lower field reveals decreased breath sounds. Examination of the left-lower field reveals decreased breath sounds. Decreased breath sounds present. No wheezing, rhonchi or rales.  ?Abdominal:  ?   Palpations: Abdomen is soft.  ?   Tenderness: There is no abdominal tenderness.  ?Musculoskeletal:  ?   Right lower leg: No swelling.  ?   Left lower leg: No swelling.  ?Skin: ?   General: Skin is warm.  ?   Findings: No rash.  ?Neurological:  ?   Mental Status: She is alert.  ?   Comments: Difficult to understand.  Able to straight leg raise.  Unable to lift elbows up off the bed.  Able to move her arms from her elbows down.  Poor grip strength bilaterally.  Weakness bilateral arms.  ?  ?Data Reviewed: ?Creatinine down to 1.45 with a GFR of 35, white blood cell count 14.1, platelet count of 467.  Lumbar puncture results still pending. ? ?Family Communication: Updated patient's daughter on the phone ? ?Disposition: ?Status is: Inpatient ?Remains inpatient appropriate because: With low NIF needs closer monitoring of respiratory status  in the stepdown unit. ? ?Planned Discharge Destination: To be determined based on clinical course ? ?Case discussed with neurology, respiratory, critical care nursing staff. ? ?Author: ?Loletha Grayer, MD ?11/05/2021 12:06 PM ? ?For on call review [email or web address].  ?

## 2021-11-05 NOTE — Plan of Care (Signed)
Patients NIF is -14 cm H2O ?

## 2021-11-06 ENCOUNTER — Inpatient Hospital Stay: Payer: Medicare Other

## 2021-11-06 DIAGNOSIS — E119 Type 2 diabetes mellitus without complications: Secondary | ICD-10-CM

## 2021-11-06 DIAGNOSIS — R1310 Dysphagia, unspecified: Secondary | ICD-10-CM

## 2021-11-06 DIAGNOSIS — G610 Guillain-Barre syndrome: Secondary | ICD-10-CM | POA: Diagnosis not present

## 2021-11-06 DIAGNOSIS — M6281 Muscle weakness (generalized): Secondary | ICD-10-CM | POA: Diagnosis not present

## 2021-11-06 DIAGNOSIS — R29818 Other symptoms and signs involving the nervous system: Secondary | ICD-10-CM | POA: Diagnosis not present

## 2021-11-06 LAB — CBC
HCT: 41.8 % (ref 36.0–46.0)
Hemoglobin: 13.2 g/dL (ref 12.0–15.0)
MCH: 29.8 pg (ref 26.0–34.0)
MCHC: 31.6 g/dL (ref 30.0–36.0)
MCV: 94.4 fL (ref 80.0–100.0)
Platelets: 369 10*3/uL (ref 150–400)
RBC: 4.43 MIL/uL (ref 3.87–5.11)
RDW: 14.1 % (ref 11.5–15.5)
WBC: 19.8 10*3/uL — ABNORMAL HIGH (ref 4.0–10.5)
nRBC: 0 % (ref 0.0–0.2)

## 2021-11-06 LAB — BLOOD GAS, ARTERIAL
Acid-base deficit: 1.9 mmol/L (ref 0.0–2.0)
Bicarbonate: 22.2 mmol/L (ref 20.0–28.0)
FIO2: 35 %
MECHVT: 450 mL
Mechanical Rate: 16
O2 Saturation: 93.6 %
PEEP: 5 cmH2O
Patient temperature: 37
pCO2 arterial: 35 mmHg (ref 32–48)
pH, Arterial: 7.41 (ref 7.35–7.45)
pO2, Arterial: 64 mmHg — ABNORMAL LOW (ref 83–108)

## 2021-11-06 LAB — BASIC METABOLIC PANEL
Anion gap: 11 (ref 5–15)
BUN: 31 mg/dL — ABNORMAL HIGH (ref 8–23)
CO2: 22 mmol/L (ref 22–32)
Calcium: 9.3 mg/dL (ref 8.9–10.3)
Chloride: 103 mmol/L (ref 98–111)
Creatinine, Ser: 1.6 mg/dL — ABNORMAL HIGH (ref 0.44–1.00)
GFR, Estimated: 31 mL/min — ABNORMAL LOW (ref >60)
Glucose, Bld: 88 mg/dL (ref 70–99)
Potassium: 4.7 mmol/L (ref 3.5–5.1)
Sodium: 136 mmol/L (ref 135–145)

## 2021-11-06 LAB — MAGNESIUM
Magnesium: 2.1 mg/dL (ref 1.7–2.4)
Magnesium: 2 mg/dL (ref 1.7–2.4)
Magnesium: 1.6 mg/dL — ABNORMAL LOW (ref 1.7–2.4)

## 2021-11-06 LAB — GLUCOSE, CAPILLARY
Glucose-Capillary: 100 mg/dL — ABNORMAL HIGH (ref 70–99)
Glucose-Capillary: 114 mg/dL — ABNORMAL HIGH (ref 70–99)
Glucose-Capillary: 119 mg/dL — ABNORMAL HIGH (ref 70–99)
Glucose-Capillary: 163 mg/dL — ABNORMAL HIGH (ref 70–99)
Glucose-Capillary: 195 mg/dL — ABNORMAL HIGH (ref 70–99)
Glucose-Capillary: 72 mg/dL (ref 70–99)
Glucose-Capillary: 82 mg/dL (ref 70–99)

## 2021-11-06 LAB — TRIGLYCERIDES: Triglycerides: 93 mg/dL (ref <150)

## 2021-11-06 LAB — PHOSPHORUS
Phosphorus: 3.7 mg/dL (ref 2.5–4.6)
Phosphorus: 3.2 mg/dL (ref 2.5–4.6)
Phosphorus: 2.7 mg/dL (ref 2.5–4.6)

## 2021-11-06 LAB — PROCALCITONIN
Procalcitonin: <0.1 ng/mL
Procalcitonin: <0.1 ng/mL

## 2021-11-06 MED ORDER — MAGNESIUM SULFATE 2 GM/50ML IV SOLN
2.0000 g | Freq: Once | INTRAVENOUS | Status: AC
  Administered 2021-11-06: 2 g via INTRAVENOUS
  Filled 2021-11-06: qty 50

## 2021-11-06 MED ORDER — SODIUM CHLORIDE 0.9 % IV SOLN: 250.0000 mL | INTRAVENOUS | Status: DC

## 2021-11-06 MED ORDER — MIDAZOLAM HCL 2 MG/2ML IJ SOLN
INTRAMUSCULAR | Status: AC
  Administered 2021-11-06: 2 mg via INTRAVENOUS
  Filled 2021-11-06: qty 2

## 2021-11-06 MED ORDER — FAMOTIDINE 20 MG PO TABS
40.0000 mg | ORAL_TABLET | Freq: Every day | ORAL | Status: DC
  Administered 2021-11-06: 40 mg via ORAL
  Filled 2021-11-06: qty 2

## 2021-11-06 MED ORDER — NOREPINEPHRINE 4 MG/250ML-% IV SOLN
2.0000 ug/min | INTRAVENOUS | Status: DC
  Administered 2021-11-06: 2 ug/min via INTRAVENOUS
  Administered 2021-11-07: 4 ug/min via INTRAVENOUS
  Filled 2021-11-06 (×2): qty 250

## 2021-11-06 MED ORDER — VITAL HIGH PROTEIN PO LIQD
1000.0000 mL | ORAL | Status: DC
  Administered 2021-11-06 – 2021-11-08 (×3): 1000 mL

## 2021-11-06 MED ORDER — INSULIN ASPART 100 UNIT/ML IJ SOLN
0.0000 [IU] | INTRAMUSCULAR | Status: DC
  Administered 2021-11-06 – 2021-11-07 (×2): 4 [IU] via SUBCUTANEOUS
  Administered 2021-11-07: 7 [IU] via SUBCUTANEOUS
  Administered 2021-11-07: 4 [IU] via SUBCUTANEOUS
  Administered 2021-11-07 – 2021-11-08 (×3): 7 [IU] via SUBCUTANEOUS
  Administered 2021-11-08: 4 [IU] via SUBCUTANEOUS
  Administered 2021-11-08: 7 [IU] via SUBCUTANEOUS
  Administered 2021-11-08: 4 [IU] via SUBCUTANEOUS
  Administered 2021-11-08 – 2021-11-10 (×12): 7 [IU] via SUBCUTANEOUS
  Administered 2021-11-10: 11 [IU] via SUBCUTANEOUS
  Administered 2021-11-10 – 2021-11-11 (×5): 7 [IU] via SUBCUTANEOUS
  Administered 2021-11-11 (×2): 11 [IU] via SUBCUTANEOUS
  Administered 2021-11-12: 7 [IU] via SUBCUTANEOUS
  Administered 2021-11-12 (×2): 4 [IU] via SUBCUTANEOUS
  Administered 2021-11-12: 3 [IU] via SUBCUTANEOUS
  Administered 2021-11-12 (×2): 4 [IU] via SUBCUTANEOUS
  Administered 2021-11-12 – 2021-11-13 (×3): 3 [IU] via SUBCUTANEOUS
  Administered 2021-11-13: 4 [IU] via SUBCUTANEOUS
  Administered 2021-11-13: 3 [IU] via SUBCUTANEOUS
  Administered 2021-11-13: 7 [IU] via SUBCUTANEOUS
  Administered 2021-11-13: 5 [IU] via SUBCUTANEOUS
  Administered 2021-11-14 (×2): 3 [IU] via SUBCUTANEOUS
  Filled 2021-11-06 (×41): qty 1

## 2021-11-06 MED ORDER — MIDAZOLAM HCL 2 MG/2ML IJ SOLN: 2.0000 mg | INTRAMUSCULAR | Status: AC

## 2021-11-06 MED ORDER — PROSOURCE TF PO LIQD
45.0000 mL | Freq: Two times a day (BID) | ORAL | Status: DC
  Administered 2021-11-06 – 2021-11-08 (×5): 45 mL

## 2021-11-06 NOTE — Progress Notes (Signed)
? ?NAME:  JOHAN ANTONACCI, MRN:  [id number], DOB:  [birth year], LOS: 2 ?ADMISSION DATE:  10/29/2021, CONSULTATION DATE:  11/05/2021 ?REFERRING MD:  Dr. Leslye Peer, CHIEF COMPLAINT:  Upper extremity weakness, dysarthria/dysphagia  ? ?Brief Pt Description / Synopsis:  ?88 y.o. 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  Transferred to [facility] 3/31 with PCCM consulted to follow respiratory status and ability to protect her airway ?Progressive rep failure and intubated 4/1 ? ?History of Present Illness/SYNOPSIS  ? 88 year old female with a past medical history as listed below who presented to [facility] ED on 10/14/2021 due to complaints of abrupt onset of bilateral upper extremity weakness. ? ?She was in her normal state of health until 2:30 AM on Monday evening, where she noticed bilateral upper extremity weakness, along with dysarthria.  Both of these have progressively worsened. ? ?Of note she was seen at [facility] on 3/28 and had workup including MRI and MRA of the brain which showed no acute findings, thus was discharged home. ? ?She was admitted by the hospitalist with Neurology consultation.  Concern for cervical-pharyngeal-brachial variant of guillian-barre syndrome. ? ?On 3/31, she was noted to have increased spitting of oral secretions, and failed her swallow evaluation.  NIF was 14.  She was transferred to [facility] with PCCM consultation for close monitoring of respiratory status.  ? ? ?Pertinent  Medical History  ?Chronic kidney disease Stage 4 ?Diabetes mellitus ?Hyperparathyroidism ?Hypertension ?TIA ?MRI BRAIN WNL 3/30 ?MRI CERVICAL SPINE 3/30 ?C3-C4 moderate spinal canal stenosis with moderate right and mild ?left neural foraminal narrowing. ?C6-C7 moderate spinal canal stenosis with mild right neural ?foraminal narrowing. ?C5-C6 mild spinal canal stenosis and mild right neural foraminal ?narrowing. ? C2-C3 mild spinal canal stenosis, without neural  foraminal ?narrowing. ?C4-C5 moderate to severe left neural foraminal narrowing. ? ?Micro Data:  ?3/30: SARS-CoV-2 & Influenza PCR>>negative ?3/31: MRSA PCR>>negative ? ?Antimicrobials:  ?N/A ? ?Significant Hospital Events: ?Including procedures, antibiotic start and stop dates in addition to other pertinent events   ?3/29: Presented to ED in evening.  Admitted by Hospitalist.  Neurology consulted ?3/30: Seen by Neurology, concern for Guillain-barre syndrome given clinical presentation. ?3/31:  Pt with continued dysarthria/dysphagia.  Spitting out saliva, failed bedside swallow evaluation.  Transferred to Stepdown with PCCM consultation to closely follow respiratory status ?4/1 patient emergently intubated ? ?Interim History / Subjective:  ?Severe dysarthria and dysphagia and inability to protect airway with progressive resp distress-patient emergently intubated ?NIF 14 noted prior to intubation ? ?Needs vent support ?Remains on vent ?Started IV IG therapy for GBS ? ?Objective   ?Blood pressure (!) 116/54, pulse 75, temperature 98.6 ?F (37 ?C), temperature source Oral, resp. rate 16, height 5' 8" (1.727 m), weight 100.3 kg, SpO2 97 %. ?   ?Vent Mode: PRVC ?FiO2 (%):  [35 %-45 %] 45 % ?Set Rate:  [16 bmp] 16 bmp ?Vt Set:  [450 mL] 450 mL ?PEEP:  [5 cmH20] 5 cmH20 ?Plateau Pressure:  [17 cmH20] 17 cmH20  ? ?Intake/Output Summary (Last 24 hours) at 11/06/2021 0710 ?Last data filed at 11/06/2021 [phone] ?Gross per 24 hour  ?Intake 1287.88 ml  ?Output 1475 ml  ?Net -187.12 ml  ? ? ?Filed Weights  ? 11/02/2021 1400 10/22/2021 2201 11/05/21 1030  ?Weight: 99.8 kg 98.1 kg 100.3 kg  ? ? ?REVIEW OF SYSTEMS ? ?PATIENT IS UNABLE TO PROVIDE COMPLETE REVIEW OF SYSTEMS DUE TO SEVERE CRITICAL ILLNESS AND TOXIC METABOLIC ENCEPHALOPATHY ? ? ? ?PHYSICAL EXAMINATION: ? ?GENERAL:critically ill appearing, +resp distress ?  EYES: Pupils equal, round, reactive to light.  No scleral icterus.  ?MOUTH: Moist mucosal membrane. INTUBATED ?NECK: Supple.   ?PULMONARY: +rhonchi, +wheezing ?CARDIOVASCULAR: S1 and S2.  No murmurs  ?GASTROINTESTINAL: Soft, nontender, -distended. Positive bowel sounds.  ?MUSCULOSKELETAL: No swelling, clubbing, or edema.  ?NEUROLOGIC: obtunded ?SKIN:intact,warm,dry ? ?Assessment & Plan:  ? ?88 yo WF with Suspected Guillain Barr? syndrome progressive resp failure and inability to protect airway with severe Hypoxic resp failure aslo due to aspiration pneumonitis ? ?Severe ACUTE Hypoxic and Hypercapnic Respiratory Failure ?-continue Mechanical Ventilator support ?-Wean Fio2 and PEEP as tolerated ?-VAP/VENT bundle implementation ?- Wean PEEP & FiO2 as tolerated, maintain SpO2 > 88% ?- Head of bed elevated 30 degrees, VAP protocol in place ?- Plateau pressures less than 30 cm H20  ?- Intermittent chest x-ray & ABG PRN ?- Ensure adequate pulmonary hygiene  ?-will perform SAT/SBT when respiratory parameters are met ? ? ? ?NEUROLOGY ?ACUTE GBS suspected ?Follow up Neurology recs ?-need for sedation ?-Goal RASS -2 to -3 ?S/p IVIG therapy  ? ? ?ENDO ?- ICU hypoglycemic\Hyperglycemia protocol ?-check FSBS per protocol ? ? ?GI ?GI PROPHYLAXIS as indicated ? ?NUTRITIONAL STATUS ?DIET-->TF's as tolerated ?Constipation protocol as indicated ? ? ?ELECTROLYTES ?-follow labs as needed ?-replace as needed ?-pharmacy consultation and following ? ? ? ?Best Practice (right click and "Reselect all SmartList Selections" daily)  ?Diet/type: start feeds ?DVT prophylaxis: SCD ?GI prophylaxis: N/A ?Lines: N/A ?Foley:  N/A ?Code Status:  full code ? ? ?Labs   ?CBC: ?Recent Labs  ?Lab 10/27/2021 ?1411 11/05/2021 ?2136 11/05/21 ?0725 11/06/21 ?0507  ?WBC 13.3* 12.8* 14.1* 19.8*  ?NEUTROABS 9.2*  --   --   --   ?HGB 12.7 12.3 13.1 13.2  ?HCT 40.1 38.5 40.7 41.8  ?MCV 92.8 91.2 91.3 94.4  ?PLT 544* 474* 467* 369  ? ? ? ?Basic Metabolic Panel: ?Recent Labs  ?Lab 10/20/2021 ?1411 11/05/2021 ?2136 11/05/21 ?0725 11/06/21 ?0507  ?NA 138  --  136 136  ?K 4.4  --  4.3 4.7  ?CL 105  --   105 103  ?CO2 25  --  22 22  ?GLUCOSE 117*  --  102* 88  ?BUN 39*  --  25* 31*  ?CREATININE 1.68* 1.58* 1.45* 1.60*  ?CALCIUM 9.8  --  9.2 9.3  ?MG  --   --  2.0 2.1  ?PHOS  --   --   --  3.7  ? ? ?GFR: ?Estimated Creatinine Clearance: 31.3 mL/min (A) (by C-G formula based on SCr of 1.6 mg/dL (H)). ?Recent Labs  ?Lab 10/26/2021 ?1411 10/22/2021 ?2136 11/05/21 ?0725 11/05/21 ?0800 11/06/21 ?0507  ?PROCALCITON  --   --   --  <0.10 <0.10  ?WBC 13.3* 12.8* 14.1*  --  19.8*  ? ? ? ?Liver Function Tests: ?Recent Labs  ?Lab 10/07/2021 ?1411  ?AST 20  ?ALT 21  ?ALKPHOS 97  ?BILITOT 0.6  ?PROT 7.3  ?ALBUMIN 3.5  ? ? ?ABG ?   ?Component Value Date/Time  ? PHART 7.41 11/05/2021 2146  ? PCO2ART 35 11/05/2021 2146  ? PO2ART 64 (L) 11/05/2021 2146  ? HCO3 22.2 11/05/2021 2146  ? ACIDBASEDEF 1.9 11/05/2021 2146  ? O2SAT 93.6 11/05/2021 2146  ?  ? ?Coagulation Profile: ?Recent Labs  ?Lab 10/23/2021 ?1411  ?INR 1.0  ? ? ? ?Cardiac Enzymes: ?Recent Labs  ?Lab 10/23/2021 ?1903  ?CKTOTAL 109  ? ? ? ?HbA1C: ?Hgb A1c MFr Bld  ?Date/Time Value Ref Range Status  ?11/04/2021 05:11 AM  6.9 (H) 4.8 - 5.6 % Final  ?  Comment:  ?  (NOTE) ?Pre diabetes:          5.7%-6.4% ? ?Diabetes:              >6.4% ? ?Glycemic control for   <7.0% ?adults with diabetes ?  ? ? ?CBG: ?Recent Labs  ?Lab 11/05/21 ?1141 11/05/21 ?1509 11/05/21 ?2122 11/06/21 ?0020 11/06/21 ?0333  ?GLUCAP 96 107* 120* 100* 114*  ? ? ?Allergies ?Allergies  ?Allergen Reactions  ? Pravastatin Other (See Comments)  ?  Other reaction(s): Dizziness, Muscle Pain  ? Codeine Other (See Comments)  ?  Other reaction(s): Dizziness  ? Ibuprofen Other (See Comments)  ?  Other reaction(s): Dizziness  ? Penicillins   ?  Other reaction(s): Other (See Comments) ?Tongue and facial swelling ?Other reaction(s): Other (See Comments) ?Tongue and facial swelling ?  ? Sulfa Antibiotics Other (See Comments)  ? Lansoprazole Diarrhea  ?  ? ?Home Medications  ?Prior to Admission medications   ?Medication Sig Start Date  End Date Taking? Authorizing Provider  ?acetaminophen (TYLENOL) 500 MG tablet Take 500 mg by mouth every 6 (six) hours as needed.   Yes [provider]  ?aspirin 81 MG EC tablet Take 81 mg by mouth daily.

## 2021-11-06 NOTE — Progress Notes (Signed)
An USGPIV (ultrasound guided PIV) has been placed for short-term vasopressor infusion. A correctly placed ivWatch must be used when administering Vasopressors. Should this treatment be needed beyond 72 hours, central line access should be obtained.  It will be the responsibility of the bedside nurse to follow best practice to prevent extravasations.   ?

## 2021-11-06 NOTE — Progress Notes (Signed)
FIO2 increased to .45 ( O2 sat running 90-91) ?

## 2021-11-06 NOTE — Progress Notes (Signed)
Brief Nutrition Note ? ?Consult received for enteral/tube feeding initiation and management. Pt with OG tube in stomach with side port at the level of the gastric body per abdominal x-ray obtained today at 1145. ? ?Adult Enteral Nutrition Protocol initiated. Full assessment to follow. ? ?Admitting Dx: Weakness [R53.1] ?Proximal limb muscle weakness [M62.81] ?Dysphagia, unspecified type [R13.10] ?Subacute neurologic deficit [R29.818] ? ? ?Labs: ?Recent Labs  ?Lab 10/23/2021 ?1411 10/29/2021 ?2136 11/05/21 ?0725 11/06/21 ?0507  ?NA 138  --  136 136  ?K 4.4  --  4.3 4.7  ?CL 105  --  105 103  ?CO2 25  --  22 22  ?BUN 39*  --  25* 31*  ?CREATININE 1.68* 1.58* 1.45* 1.60*  ?CALCIUM 9.8  --  9.2 9.3  ?MG  --   --  2.0 2.1  ?PHOS  --   --   --  3.7  ?GLUCOSE 117*  --  102* 88  ? ? ? ?Gustavus Bryant, MS, RD, LDN ?Inpatient Clinical Dietitian ?Please see AMiON for contact information. ? ?

## 2021-11-06 NOTE — Progress Notes (Signed)
Upon initial assessment at the start of my shift, the patient was found to be gurgling with a mouth full of secretions with increasingly weak cough - unable to clear secretions. Mouth and throat were suctioned and O2 sats were stable. Britton-Lee NP was made aware of possible airway compromise. Provider came to bedside and decided to intubate following her assessment and discussion with the patient and her daughter. Patient intubated 2030. See eMAR for medications given. Pt tolerated the procedure well. VS stable and NAD. OG tube insertion was attempted by NP and 2 registered nurses. All were unable to advance OG tube.  ?

## 2021-11-06 DEATH — deceased

## 2021-11-07 ENCOUNTER — Encounter: Payer: Self-pay | Admitting: Internal Medicine

## 2021-11-07 DIAGNOSIS — G610 Guillain-Barre syndrome: Secondary | ICD-10-CM | POA: Diagnosis not present

## 2021-11-07 LAB — BASIC METABOLIC PANEL
Anion gap: 7 (ref 5–15)
BUN: 38 mg/dL — ABNORMAL HIGH (ref 8–23)
CO2: 20 mmol/L — ABNORMAL LOW (ref 22–32)
Calcium: 8.5 mg/dL — ABNORMAL LOW (ref 8.9–10.3)
Chloride: 105 mmol/L (ref 98–111)
Creatinine, Ser: 1.73 mg/dL — ABNORMAL HIGH (ref 0.44–1.00)
GFR, Estimated: 28 mL/min — ABNORMAL LOW (ref >60)
Glucose, Bld: 200 mg/dL — ABNORMAL HIGH (ref 70–99)
Potassium: 3.9 mmol/L (ref 3.5–5.1)
Sodium: 132 mmol/L — ABNORMAL LOW (ref 135–145)

## 2021-11-07 LAB — CBC WITH DIFFERENTIAL/PLATELET
Abs Immature Granulocytes: 0.27 10*3/uL — ABNORMAL HIGH (ref 0.00–0.07)
Basophils Absolute: 0.1 10*3/uL (ref 0.0–0.1)
Basophils Relative: 0 %
Eosinophils Absolute: 0.3 10*3/uL (ref 0.0–0.5)
Eosinophils Relative: 1 %
HCT: 36.9 % (ref 36.0–46.0)
Hemoglobin: 11.5 g/dL — ABNORMAL LOW (ref 12.0–15.0)
Immature Granulocytes: 1 %
Lymphocytes Relative: 12 %
Lymphs Abs: 2.7 10*3/uL (ref 0.7–4.0)
MCH: 29.6 pg (ref 26.0–34.0)
MCHC: 31.2 g/dL (ref 30.0–36.0)
MCV: 95.1 fL (ref 80.0–100.0)
Monocytes Absolute: 1.9 10*3/uL — ABNORMAL HIGH (ref 0.1–1.0)
Monocytes Relative: 8 %
Neutro Abs: 17.5 10*3/uL — ABNORMAL HIGH (ref 1.7–7.7)
Neutrophils Relative %: 78 %
Platelets: 350 10*3/uL (ref 150–400)
RBC: 3.88 MIL/uL (ref 3.87–5.11)
RDW: 14.4 % (ref 11.5–15.5)
WBC: 22.7 10*3/uL — ABNORMAL HIGH (ref 4.0–10.5)
nRBC: 0 % (ref 0.0–0.2)

## 2021-11-07 LAB — GLUCOSE, CAPILLARY
Glucose-Capillary: 182 mg/dL — ABNORMAL HIGH (ref 70–99)
Glucose-Capillary: 200 mg/dL — ABNORMAL HIGH (ref 70–99)
Glucose-Capillary: 200 mg/dL — ABNORMAL HIGH (ref 70–99)
Glucose-Capillary: 213 mg/dL — ABNORMAL HIGH (ref 70–99)
Glucose-Capillary: 225 mg/dL — ABNORMAL HIGH (ref 70–99)
Glucose-Capillary: 249 mg/dL — ABNORMAL HIGH (ref 70–99)

## 2021-11-07 LAB — PROCALCITONIN: Procalcitonin: 0.53 ng/mL

## 2021-11-07 LAB — PHOSPHORUS: Phosphorus: 3.3 mg/dL (ref 2.5–4.6)

## 2021-11-07 LAB — MAGNESIUM: Magnesium: 2.4 mg/dL (ref 1.7–2.4)

## 2021-11-07 MED ORDER — FAMOTIDINE 20 MG PO TABS
10.0000 mg | ORAL_TABLET | Freq: Every day | ORAL | Status: DC
  Administered 2021-11-07 – 2021-11-12 (×6): 10 mg
  Filled 2021-11-07 (×6): qty 1

## 2021-11-07 MED ORDER — FENTANYL CITRATE PF 50 MCG/ML IJ SOSY
25.0000 ug | PREFILLED_SYRINGE | INTRAMUSCULAR | Status: DC | PRN
  Administered 2021-11-07: 100 ug via INTRAVENOUS
  Administered 2021-11-08: 50 ug via INTRAVENOUS
  Filled 2021-11-07: qty 1
  Filled 2021-11-07: qty 2

## 2021-11-07 MED ORDER — MIDAZOLAM HCL 2 MG/2ML IJ SOLN
2.0000 mg | INTRAMUSCULAR | Status: DC | PRN
  Administered 2021-11-07 – 2021-11-08 (×2): 2 mg via INTRAVENOUS
  Administered 2021-11-08: 4 mg via INTRAVENOUS
  Administered 2021-11-08 (×3): 2 mg via INTRAVENOUS
  Administered 2021-11-08: 4 mg via INTRAVENOUS
  Administered 2021-11-08: 2 mg via INTRAVENOUS
  Administered 2021-11-09: 4 mg via INTRAVENOUS
  Administered 2021-11-09 (×3): 2 mg via INTRAVENOUS
  Filled 2021-11-07 (×6): qty 2
  Filled 2021-11-07 (×2): qty 4
  Filled 2021-11-07 (×3): qty 2
  Filled 2021-11-07: qty 4

## 2021-11-07 MED ORDER — FAMOTIDINE 20 MG PO TABS: 20.0000 mg | ORAL_TABLET | Freq: Every day | ORAL | Status: DC

## 2021-11-07 MED ORDER — ASPIRIN 81 MG PO CHEW
81.0000 mg | CHEWABLE_TABLET | Freq: Every day | ORAL | Status: DC
  Administered 2021-11-07 – 2021-11-14 (×7): 81 mg
  Filled 2021-11-07 (×7): qty 1

## 2021-11-07 MED ORDER — ENOXAPARIN SODIUM 60 MG/0.6ML IJ SOSY
0.5000 mg/kg | PREFILLED_SYRINGE | INTRAMUSCULAR | Status: DC
  Administered 2021-11-07 – 2021-11-10 (×4): 50 mg via SUBCUTANEOUS
  Filled 2021-11-07 (×4): qty 0.5

## 2021-11-07 MED ORDER — IPRATROPIUM-ALBUTEROL 0.5-2.5 (3) MG/3ML IN SOLN
3.0000 mL | RESPIRATORY_TRACT | Status: DC | PRN
  Administered 2021-11-07: 3 mL via RESPIRATORY_TRACT
  Filled 2021-11-07: qty 3

## 2021-11-07 MED ORDER — INSULIN ASPART 100 UNIT/ML IJ SOLN
3.0000 [IU] | INTRAMUSCULAR | Status: DC
  Administered 2021-11-08 (×3): 3 [IU] via SUBCUTANEOUS
  Filled 2021-11-07 (×3): qty 1

## 2021-11-07 NOTE — Progress Notes (Signed)
PHARMACY NOTE:  RENAL DOSAGE ADJUSTMENT ? ?Current regimen includes a mismatch between dosage and estimated renal function.  As per policy approved by the Pharmacy & Therapeutics and Medical Executive Committees, the dosage will be adjusted accordingly. ? ?Current dosage:  Pepcid 40 mg daily ? ?Indication: SUP / GERD ? ?Renal Function: ? ?Estimated Creatinine Clearance: 28.9 mL/min (A) (by C-G formula based on SCr of 1.73 mg/dL (H)). ?   ?Antimicrobial dosage has been changed to:  Will decrease to 10 mg daily for now. Continue to follow renal function ? ?Thank you for allowing pharmacy to be a part of this patient's care. ? ?Benita Gutter, RPH ?11/07/2021 7:29 AM ? ? ?   ?

## 2021-11-07 NOTE — Plan of Care (Signed)
? ?  Interdisciplinary Goals of Care Family Meeting ? ? ?Date carried out: 11/07/2021 ? ?Location of the meeting: Bedside ? ?Member's involved: Physician, Social Worker, and Family Member or next of kin ? ?Durable Power of Tour manager: Daughter [name] ? ?Code status: Full Code ? ?Disposition: Continue current acute care ? ? ? ? ?GOALS OF CARE DISCUSSION ? ?The Clinical status was relayed to family in detail- ? ?Updated and notified of patients medical condition- ?Patient remains unresponsive and will not open eyes to command.   ?Patient is having a weak cough and struggling to remove secretions.   ?Patient with increased WOB and using accessory muscles to breathe ?Explained to family course of therapy and the modalities  ? ?Patient with Progressive multiorgan failure with a very high probablity of a very minimal chance of meaningful recovery despite all aggressive and optimal medical therapy.  ?PATIENT REMAINS FULL CODE ? ?Family understands the situation. ? ? ?Family are satisfied with Plan of action and management. All questions answered ? ?Additional CC time 25 mins ? ? ?Corrin Parker, M.D.  ?[facility]  ?Medical Director [place] ?Medical Director [facility] Cardio-Pulmonary Department  ? ? ?

## 2021-11-07 NOTE — Progress Notes (Signed)
? ?NAME:  Amber Fields, MRN:  [id number], DOB:  [birth year], LOS: 3 ?ADMISSION DATE:  10/25/2021, CONSULTATION DATE:  11/05/2021 ?REFERRING MD:  Dr. Leslye Peer, CHIEF COMPLAINT:  Upper extremity weakness, dysarthria/dysphagia  ? ?Brief Pt Description / Synopsis:  ?88 y.o. 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  Transferred to [facility] 3/31 with PCCM consulted to follow respiratory status and ability to protect her airway ?Progressive rep failure and intubated 4/1 ? ?History of Present Illness/SYNOPSIS  ? 88 year old female with a past medical history as listed below who presented to [facility] ED on 10/10/2021 due to complaints of abrupt onset of bilateral upper extremity weakness. ? ?She was in her normal state of health until 2:30 AM on Monday evening, where she noticed bilateral upper extremity weakness, along with dysarthria.  Both of these have progressively worsened. ? ?Of note she was seen at [facility] on 3/28 and had workup including MRI and MRA of the brain which showed no acute findings, thus was discharged home. ? ?She was admitted by the hospitalist with Neurology consultation.  Concern for cervical-pharyngeal-brachial variant of guillian-barre syndrome. ? ?On 3/31, she was noted to have increased spitting of oral secretions, and failed her swallow evaluation.  NIF was 14.  She was transferred to [facility] with PCCM consultation for close monitoring of respiratory status.  ? ? ?Pertinent  Medical History  ?Chronic kidney disease Stage 4 ?Diabetes mellitus ?Hyperparathyroidism ?Hypertension ?TIA ?MRI BRAIN WNL 3/30 ?MRI CERVICAL SPINE 3/30 ?C3-C4 moderate spinal canal stenosis with moderate right and mild ?left neural foraminal narrowing. ?C6-C7 moderate spinal canal stenosis with mild right neural ?foraminal narrowing. ?C5-C6 mild spinal canal stenosis and mild right neural foraminal ?narrowing. ? C2-C3 mild spinal canal stenosis, without neural  foraminal ?narrowing. ?C4-C5 moderate to severe left neural foraminal narrowing. ? ?Micro Data:  ?3/30: SARS-CoV-2 & Influenza PCR>>negative ?3/31: MRSA PCR>>negative ? ?Antimicrobials:  ?N/A ? ?Significant Hospital Events: ?Including procedures, antibiotic start and stop dates in addition to other pertinent events   ?3/29: Presented to ED in evening.  Admitted by Hospitalist.  Neurology consulted ?3/30: Seen by Neurology, concern for Guillain-barre syndrome given clinical presentation. ?3/31:  Pt with continued dysarthria/dysphagia.  Spitting out saliva, failed bedside swallow evaluation.  Transferred to Stepdown with PCCM consultation to closely follow respiratory status ?4/1 patient emergently intubated ?4/2 remains intubated and sedated ? ?Interim History / Subjective:  ?NIF 14 noted prior to intubation ?Continue vent support ?NEURO ASSESSMENTS ?Started IV IG therapy for GBS 3/31 ? ?Objective   ?Blood pressure (!) 95/48, pulse 62, temperature 98.8 ?F (37.1 ?C), temperature source Axillary, resp. rate 11, height 5' 8"  (1.727 m), weight 100.3 kg, SpO2 96 %. ?   ?Vent Mode: PRVC ?FiO2 (%):  [40 %-45 %] 40 % ?Set Rate:  [16 bmp] 16 bmp ?Vt Set:  [450 mL] 450 mL ?PEEP:  [5 cmH20] 5 cmH20 ?Plateau Pressure:  [12 cmH20] 12 cmH20  ? ?Intake/Output Summary (Last 24 hours) at 11/07/2021 0710 ?Last data filed at 11/07/2021 0620 ?Gross per 24 hour  ?Intake 3462.9 ml  ?Output 605 ml  ?Net 2857.9 ml  ? ? ?Filed Weights  ? 10/23/2021 2201 11/05/21 1030 11/07/21 0404  ?Weight: 98.1 kg 100.3 kg 100.3 kg  ? ? ?REVIEW OF SYSTEMS ? ?PATIENT IS UNABLE TO PROVIDE COMPLETE REVIEW OF SYSTEMS DUE TO SEVERE CRITICAL ILLNESS AND TOXIC METABOLIC ENCEPHALOPATHY ? ? ?PHYSICAL EXAMINATION: ? ?GENERAL:critically ill appearing, +resp distress ?EYES: Pupils equal, round, reactive to light.  No scleral icterus.  ?  MOUTH: Moist mucosal membrane. INTUBATED ?NECK: Supple.  ?PULMONARY: +rhonchi, +wheezing ?CARDIOVASCULAR: S1 and S2.  No murmurs   ?GASTROINTESTINAL: Soft, nontender, -distended. Positive bowel sounds.  ?MUSCULOSKELETAL: No swelling, clubbing, or edema.  ?NEUROLOGIC: obtunded ?SKIN:intact,warm,dry ? ?Assessment & Plan:  ? ?88 yo WF with Suspected Guillain Barr? syndrome progressive resp failure and inability to protect airway with severe Hypoxic resp failure aslo due to aspiration pneumonitis ? ? ?Severe ACUTE Hypoxic and Hypercapnic Respiratory Failure ?-continue Mechanical Ventilator support ?-Wean Fio2 and PEEP as tolerated ?-VAP/VENT bundle implementation ?- Wean PEEP & FiO2 as tolerated, maintain SpO2 > 88% ?- Head of bed elevated 30 degrees, VAP protocol in place ?- Plateau pressures less than 30 cm H20  ?- Intermittent chest x-ray & ABG PRN ?- Ensure adequate pulmonary hygiene  ?-will perform SAT/SBT when respiratory parameters are met ? ? ?NEUROLOGY ?Diagnosis of ACUTE GBS  ?Follow up Neurology recs ?S/p IVIG therapy started 3/31 and continue for 5 days ? ? ?ENDO ?- ICU hypoglycemic\Hyperglycemia protocol ?-check FSBS per protocol ? ? ?GI ?GI PROPHYLAXIS as indicated ? ?NUTRITIONAL STATUS ?DIET-->TF's as tolerated ?Constipation protocol as indicated ? ? ?ELECTROLYTES ?-follow labs as needed ?-replace as needed ?-pharmacy consultation and following ? ? ? ?Best Practice (right click and "Reselect all SmartList Selections" daily)  ?Diet/type: start feeds ?DVT prophylaxis: LOVENOX ?GI prophylaxis: PEPCID ?Lines: N/A ?Foley:  N/A ?Code Status:  full code ? ? ?Labs   ?CBC: ?Recent Labs  ?Lab 11/01/2021 ?1411 11/02/2021 ?2136 11/05/21 ?0725 11/06/21 ?0507 11/07/21 ?0418  ?WBC 13.3* 12.8* 14.1* 19.8* 22.7*  ?NEUTROABS 9.2*  --   --   --  17.5*  ?HGB 12.7 12.3 13.1 13.2 11.5*  ?HCT 40.1 38.5 40.7 41.8 36.9  ?MCV 92.8 91.2 91.3 94.4 95.1  ?PLT 544* 474* 467* 369 350  ? ? ? ?Basic Metabolic Panel: ?Recent Labs  ?Lab 11/02/2021 ?1411 10/12/2021 ?2136 11/05/21 ?0725 11/06/21 ?0507 11/06/21 ?1317 11/06/21 ?1718 11/07/21 ?0418  ?NA 138  --  136 136  --   --   132*  ?K 4.4  --  4.3 4.7  --   --  3.9  ?CL 105  --  105 103  --   --  105  ?CO2 25  --  22 22  --   --  20*  ?GLUCOSE 117*  --  102* 88  --   --  200*  ?BUN 39*  --  25* 31*  --   --  38*  ?CREATININE 1.68* 1.58* 1.45* 1.60*  --   --  1.73*  ?CALCIUM 9.8  --  9.2 9.3  --   --  8.5*  ?MG  --   --  2.0 2.1 2.0 1.6*  --   ?PHOS  --   --   --  3.7 3.2 2.7  --   ? ? ?GFR: ?Estimated Creatinine Clearance: 28.9 mL/min (A) (by C-G formula based on SCr of 1.73 mg/dL (H)). ?Recent Labs  ?Lab 10/09/2021 ?2136 11/05/21 ?0725 11/05/21 ?0800 11/06/21 ?0507 11/07/21 ?0418  ?PROCALCITON  --   --  <0.10 <0.10 0.53  ?WBC 12.8* 14.1*  --  19.8* 22.7*  ? ? ? ?Liver Function Tests: ?Recent Labs  ?Lab 10/09/2021 ?1411  ?AST 20  ?ALT 21  ?ALKPHOS 97  ?BILITOT 0.6  ?PROT 7.3  ?ALBUMIN 3.5  ? ? ?ABG ?   ?Component Value Date/Time  ? PHART 7.41 11/05/2021 2146  ? PCO2ART 35 11/05/2021 2146  ? PO2ART 64 (L) 11/05/2021 2146  ?  HCO3 22.2 11/05/2021 2146  ? ACIDBASEDEF 1.9 11/05/2021 2146  ? O2SAT 93.6 11/05/2021 2146  ?  ? ?Coagulation Profile: ?Recent Labs  ?Lab 10/10/2021 ?1411  ?INR 1.0  ? ? ? ?Cardiac Enzymes: ?Recent Labs  ?Lab 10/11/2021 ?1903  ?CKTOTAL 109  ? ? ? ?HbA1C: ?Hgb A1c MFr Bld  ?Date/Time Value Ref Range Status  ?11/04/2021 05:11 AM 6.9 (H) 4.8 - 5.6 % Final  ?  Comment:  ?  (NOTE) ?Pre diabetes:          5.7%-6.4% ? ?Diabetes:              >6.4% ? ?Glycemic control for   <7.0% ?adults with diabetes ?  ? ? ?CBG: ?Recent Labs  ?Lab 11/06/21 ?1142 11/06/21 ?1621 11/06/21 ?1919 11/06/21 ?2338 11/07/21 ?0354  ?GLUCAP 82 119* 163* 195* 182*  ? ? ?Allergies ?Allergies  ?Allergen Reactions  ? Pravastatin Other (See Comments)  ?  Other reaction(s): Dizziness, Muscle Pain  ? Codeine Other (See Comments)  ?  Other reaction(s): Dizziness  ? Ibuprofen Other (See Comments)  ?  Other reaction(s): Dizziness  ? Penicillins   ?  Other reaction(s): Other (See Comments) ?Tongue and facial swelling ?Other reaction(s): Other (See Comments) ?Tongue and  facial swelling ?  ? Sulfa Antibiotics Other (See Comments)  ? Lansoprazole Diarrhea  ?  ? ?Home Medications  ?Prior to Admission medications   ?Medication Sig Start Date End Date Taking? Authorizing Provider  ?acetaminophen

## 2021-11-07 NOTE — Progress Notes (Signed)
Subjective: ?She was given a bolus of sedation shortly prior to my exam.  ? ?Exam: ?Vitals:  ? 11/07/21 0945 11/07/21 1000  ?BP: (!) 160/62 (!) 141/58  ?Pulse: (!) 106 87  ?Resp: (!) 22 18  ?Temp:    ?SpO2: 96% 100%  ? ?Gen: In bed, NAD ? ?Neuro: ?MS: opens eyes, weaknely attempts to squeeze hands and follow commands, but not reliably.  ?HQ:IONG symmetric.  ?Motor: she has tone in her UE bilaterally, but does not cooperatewith formal strength testing ?Sensory:responds to noxiosu stimluaiton in all 4 ext.  ?EXB:MWUXLK ? ?Pertinent Labs: ?Cr 1.73 ? ?Impression: 88 year old female with progressive upper extremity weakness and pharyngeal dysfunction.  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 She is day 3 of IVIG.  ? ?Recommendations: ?1) Continue IVIG day 3/5 ?2) will continue to follow.  ? ?Roland Rack, MD ?[facility] ?[phone] ? ?If 7pm- 7am, please page neurology on call as listed in [place]. ? ?

## 2021-11-07 NOTE — Plan of Care (Signed)
Patient remains intubated. Weaned from continuous sedation. Now off vasopressors. ? ? ?Problem: Education: ?Goal: Knowledge of General Education information will improve ?Description: Including pain rating scale, medication(s)/side effects and non-pharmacologic comfort measures ?Outcome: Progressing ?  ?Problem: Health Behavior/Discharge Planning: ?Goal: Ability to manage health-related needs will improve ?Outcome: Progressing ?  ?Problem: Clinical Measurements: ?Goal: Ability to maintain clinical measurements within normal limits will improve ?Outcome: Progressing ?Goal: Will remain free from infection ?Outcome: Progressing ?Goal: Diagnostic test results will improve ?Outcome: Progressing ?Goal: Respiratory complications will improve ?Outcome: Progressing ?Goal: Cardiovascular complication will be avoided ?Outcome: Progressing ?  ?Problem: Activity: ?Goal: Risk for activity intolerance will decrease ?Outcome: Progressing ?  ?Problem: Nutrition: ?Goal: Adequate nutrition will be maintained ?Outcome: Progressing ?  ?Problem: Coping: ?Goal: Level of anxiety will decrease ?Outcome: Progressing ?  ?Problem: Elimination: ?Goal: Will not experience complications related to bowel motility ?Outcome: Progressing ?Goal: Will not experience complications related to urinary retention ?Outcome: Progressing ?  ?Problem: Pain Managment: ?Goal: General experience of comfort will improve ?Outcome: Progressing ?  ?Problem: Safety: ?Goal: Ability to remain free from injury will improve ?Outcome: Progressing ?  ?Problem: Skin Integrity: ?Goal: Risk for impaired skin integrity will decrease ?Outcome: Progressing ?  ?

## 2021-11-08 DIAGNOSIS — G610 Guillain-Barre syndrome: Secondary | ICD-10-CM | POA: Diagnosis not present

## 2021-11-08 LAB — CBC WITH DIFFERENTIAL/PLATELET
Abs Immature Granulocytes: 0.27 10*3/uL — ABNORMAL HIGH (ref 0.00–0.07)
Basophils Absolute: 0.1 10*3/uL (ref 0.0–0.1)
Basophils Relative: 0 %
Eosinophils Absolute: 0.1 10*3/uL (ref 0.0–0.5)
Eosinophils Relative: 1 %
HCT: 32.8 % — ABNORMAL LOW (ref 36.0–46.0)
Hemoglobin: 10.3 g/dL — ABNORMAL LOW (ref 12.0–15.0)
Immature Granulocytes: 1 %
Lymphocytes Relative: 7 %
Lymphs Abs: 1.4 10*3/uL (ref 0.7–4.0)
MCH: 29.2 pg (ref 26.0–34.0)
MCHC: 31.4 g/dL (ref 30.0–36.0)
MCV: 92.9 fL (ref 80.0–100.0)
Monocytes Absolute: 1.5 10*3/uL — ABNORMAL HIGH (ref 0.1–1.0)
Monocytes Relative: 8 %
Neutro Abs: 15.5 10*3/uL — ABNORMAL HIGH (ref 1.7–7.7)
Neutrophils Relative %: 83 %
Platelets: 306 10*3/uL (ref 150–400)
RBC: 3.53 MIL/uL — ABNORMAL LOW (ref 3.87–5.11)
RDW: 14.5 % (ref 11.5–15.5)
WBC: 18.8 10*3/uL — ABNORMAL HIGH (ref 4.0–10.5)
nRBC: 0 % (ref 0.0–0.2)

## 2021-11-08 LAB — GLUCOSE, CAPILLARY
Glucose-Capillary: 198 mg/dL — ABNORMAL HIGH (ref 70–99)
Glucose-Capillary: 217 mg/dL — ABNORMAL HIGH (ref 70–99)
Glucose-Capillary: 205 mg/dL — ABNORMAL HIGH (ref 70–99)
Glucose-Capillary: 209 mg/dL — ABNORMAL HIGH (ref 70–99)
Glucose-Capillary: 225 mg/dL — ABNORMAL HIGH (ref 70–99)
Glucose-Capillary: 207 mg/dL — ABNORMAL HIGH (ref 70–99)

## 2021-11-08 LAB — BASIC METABOLIC PANEL
Anion gap: 5 (ref 5–15)
BUN: 43 mg/dL — ABNORMAL HIGH (ref 8–23)
CO2: 21 mmol/L — ABNORMAL LOW (ref 22–32)
Calcium: 8.5 mg/dL — ABNORMAL LOW (ref 8.9–10.3)
Chloride: 107 mmol/L (ref 98–111)
Creatinine, Ser: 1.43 mg/dL — ABNORMAL HIGH (ref 0.44–1.00)
GFR, Estimated: 36 mL/min — ABNORMAL LOW (ref >60)
Glucose, Bld: 219 mg/dL — ABNORMAL HIGH (ref 70–99)
Potassium: 4.1 mmol/L (ref 3.5–5.1)
Sodium: 133 mmol/L — ABNORMAL LOW (ref 135–145)

## 2021-11-08 LAB — MAGNESIUM: Magnesium: 2.3 mg/dL (ref 1.7–2.4)

## 2021-11-08 LAB — PHOSPHORUS: Phosphorus: 2.9 mg/dL (ref 2.5–4.6)

## 2021-11-08 MED ORDER — FREE WATER
30.0000 mL | Status: DC
  Administered 2021-11-08 – 2021-11-13 (×29): 30 mL

## 2021-11-08 MED ORDER — INSULIN ASPART 100 UNIT/ML IJ SOLN
4.0000 [IU] | INTRAMUSCULAR | Status: DC
  Administered 2021-11-08 – 2021-11-11 (×18): 4 [IU] via SUBCUTANEOUS
  Filled 2021-11-08 (×19): qty 1

## 2021-11-08 MED ORDER — VITAL HIGH PROTEIN PO LIQD
1000.0000 mL | ORAL | Status: DC
Start: 2021-11-08 — End: 2021-11-13
  Administered 2021-11-09 – 2021-11-12 (×4): 1000 mL

## 2021-11-08 MED ORDER — IPRATROPIUM-ALBUTEROL 0.5-2.5 (3) MG/3ML IN SOLN
3.0000 mL | RESPIRATORY_TRACT | Status: DC
  Administered 2021-11-08 – 2021-11-11 (×18): 3 mL via RESPIRATORY_TRACT
  Filled 2021-11-08 (×19): qty 3

## 2021-11-08 MED ORDER — SODIUM CHLORIDE 3 % IN NEBU
4.0000 mL | INHALATION_SOLUTION | Freq: Two times a day (BID) | RESPIRATORY_TRACT | Status: AC
  Administered 2021-11-08 – 2021-11-10 (×6): 4 mL via RESPIRATORY_TRACT
  Filled 2021-11-08 (×6): qty 4

## 2021-11-08 MED ORDER — IPRATROPIUM-ALBUTEROL 0.5-2.5 (3) MG/3ML IN SOLN: 3.0000 mL | RESPIRATORY_TRACT | Status: DC

## 2021-11-08 MED ORDER — SENNOSIDES 8.8 MG/5ML PO SYRP
5.0000 mL | ORAL_SOLUTION | Freq: Every day | ORAL | Status: DC
  Administered 2021-11-08 – 2021-11-14 (×5): 5 mL
  Filled 2021-11-08 (×8): qty 5

## 2021-11-08 MED ORDER — FENTANYL CITRATE PF 50 MCG/ML IJ SOSY
25.0000 ug | PREFILLED_SYRINGE | INTRAMUSCULAR | Status: DC | PRN
  Administered 2021-11-08: 100 ug via INTRAVENOUS
  Administered 2021-11-09: 50 ug via INTRAVENOUS
  Administered 2021-11-09 – 2021-11-10 (×3): 100 ug via INTRAVENOUS
  Administered 2021-11-11 (×3): 50 ug via INTRAVENOUS
  Administered 2021-11-11: 100 ug via INTRAVENOUS
  Administered 2021-11-11 – 2021-11-12 (×4): 50 ug via INTRAVENOUS
  Filled 2021-11-08: qty 1
  Filled 2021-11-08: qty 2
  Filled 2021-11-08 (×5): qty 1
  Filled 2021-11-08 (×2): qty 2
  Filled 2021-11-08: qty 1
  Filled 2021-11-08: qty 2
  Filled 2021-11-08: qty 1
  Filled 2021-11-08 (×2): qty 2

## 2021-11-08 NOTE — Progress Notes (Signed)
? ?NAME:  Amber Fields, MRN:  [id number], DOB:  10-Sep-1932, LOS: 4 ?ADMISSION DATE:  11/02/2021, CONSULTATION DATE:  11/05/2021 ?REFERRING MD:  Dr. Leslye Peer, CHIEF COMPLAINT:  Upper extremity weakness, dysarthria/dysphagia  ? ?Brief Pt Description / Synopsis:  ?88 y.o. 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  Transferred to [facility] 3/31 with PCCM consulted to follow respiratory status and ability to protect her airway ?Progressive rep failure and intubated 4/1 ? ?History of Present Illness/SYNOPSIS  ? 88 year old female with a past medical history as listed below who presented to [facility] ED on 10/19/2021 due to complaints of abrupt onset of bilateral upper extremity weakness. ? ?She was in her normal state of health until 2:30 AM on Monday evening, where she noticed bilateral upper extremity weakness, along with dysarthria.  Both of these have progressively worsened. ? ?Of note she was seen at [facility] on 3/28 and had workup including MRI and MRA of the brain which showed no acute findings, thus was discharged home. ? ?She was admitted by the hospitalist with Neurology consultation.  Concern for cervical-pharyngeal-brachial variant of guillian-barre syndrome. ? ?On 3/31, she was noted to have increased spitting of oral secretions, and failed her swallow evaluation.  NIF was 14.  She was transferred to [facility] with PCCM consultation for close monitoring of respiratory status.  ? ? ?Pertinent  Medical History  ?Chronic kidney disease Stage 4 ?Diabetes mellitus ?Hyperparathyroidism ?Hypertension ?TIA ?MRI BRAIN WNL 3/30 ?MRI CERVICAL SPINE 3/30 ?C3-C4 moderate spinal canal stenosis with moderate right and mild ?left neural foraminal narrowing. ?C6-C7 moderate spinal canal stenosis with mild right neural ?foraminal narrowing. ?C5-C6 mild spinal canal stenosis and mild right neural foraminal ?narrowing. ? C2-C3 mild spinal canal stenosis, without neural  foraminal ?narrowing. ?C4-C5 moderate to severe left neural foraminal narrowing. ? ?Micro Data:  ?3/30: SARS-CoV-2 & Influenza PCR>>negative ?3/31: MRSA PCR>>negative ? ?Antimicrobials:  ?N/A ? ?Significant Hospital Events: ?Including procedures, antibiotic start and stop dates in addition to other pertinent events   ?3/29: Presented to ED in evening.  Admitted by Hospitalist.  Neurology consulted ?3/30: Seen by Neurology, concern for Guillain-barre syndrome given clinical presentation. ?3/31:  Pt with continued dysarthria/dysphagia.  Spitting out saliva, failed bedside swallow evaluation.  Transferred to Stepdown with PCCM consultation to closely follow respiratory status ?4/1 patient emergently intubated ?4/2 remains intubated and sedated ? ?Interim History / Subjective:  ?No acute issues.  ? ?Objective   ?Blood pressure (!) 163/83, pulse (!) 120, temperature 98.5 ?F (36.9 ?C), resp. rate 15, height '5\' 8"'$  (1.727 m), weight 103.2 kg, SpO2 96 %. ?   ?Vent Mode: PRVC ?FiO2 (%):  [35 %] 35 % ?Set Rate:  [16 bmp] 16 bmp ?Vt Set:  [450 mL] 450 mL ?PEEP:  [5 cmH20] 5 cmH20 ?Plateau Pressure:  [13 cmH20-15 cmH20] 15 cmH20  ? ?Intake/Output Summary (Last 24 hours) at 11/08/2021 0828 ?Last data filed at 11/08/2021 0700 ?Gross per 24 hour  ?Intake 3364.49 ml  ?Output 1375 ml  ?Net 1989.49 ml  ? ?Filed Weights  ? 11/07/21 0404 11/07/21 1730 11/08/21 0400  ?Weight: 100.3 kg 103.1 kg 103.2 kg  ? ? ?REVIEW OF SYSTEMS ?Unable to obtain, pt is intubated ? ? ?PHYSICAL EXAMINATION: ? ?GENERAL:intubated ?MOUTH: MMM, some oral secretions, suctioned ?NECK: Supple.  ?PULMONARY: very coarse, +secretions in ballard. Equal chest rise. ?CARDIOVASCULAR: tachy, RR no murmur  ?GASTROINTESTINAL: Soft, nontender, -distended. Positive bowel sounds.  ?MUSCULOSKELETAL: No swelling, clubbing, or edema.  ?NEUROLOGIC: drowsy but arouses to voice and  can raise thumb on command ?SKIN:intact,warm,dry ? ?Labs reviewed, unremarkable ? ?No new  imaging ? ?Assessment & Plan:  ? ? ? ?Acute hypoxic, hypercapnic respiratory failure ?Due to variant of GBS, aspiration pneumonitis. Suspected hypercapnic respiratory failure clinically but not detected on ABG (obtained post intubation. ?-full vent support ?-PAD protocol, VAP bundle ?-Keep sedation light ?-SAT/SBT although expect that IVIG will take a while to get her strong enough to seriously consider trial of extubation ? ?Diagnosis of ACUTE GBS  ?- neuro following ?- IVIG per neuro, started 3/31 ? ?CKD ?- trend Bmet ?- limit nephrotoxins ? ?Leukocytosis ?- trend CBC ?- trach aspirate pending ? ? ? ? ?Best Practice (right click and "Reselect all SmartList Selections" daily)  ?Diet/type: TF ?DVT prophylaxis: LOVENOX ?GI prophylaxis: PEPCID ?Lines: N/A ?Foley:  N/A ?Code Status:  full code ? ?This patient is critically ill with acute hypoxic respiratory failure; which, requires frequent high complexity decision making, assessment, support, evaluation, and titration of therapies. This was completed through the application of advanced monitoring technologies and extensive interpretation of multiple databases. During this encounter critical care time was devoted to patient care services described in this note for 32 minutes. ? ? ?Walker Shadow  ?[place] ? ? ? ? ?

## 2021-11-08 NOTE — TOC Initial Note (Signed)
Transition of Care (TOC) - Initial/Assessment Note  ? ? ?Patient Details  ?Name: Amber Fields ?MRN: [id number] ?Date of Birth: 1933/04/30 ? ?Transition of Care (TOC) CM/SW Contact:    ?Shelbie Hutching, RN ?Phone Number: ?11/08/2021, 4:02 PM ? ?Clinical Narrative:                 ?Patient admitted to the hospital from home with muscle weakness and dysphagia.  Muscle weakness and dysphagia progressed to the point that patient unable to protect her airway and required intubation on 3/31.  Patient seen by neurology and diagnosed with Guillain-Barre Syndrome.  Patient currently in ICU intubated and sedated.   ? ?TOC will follow for patient progress and assessment of needs.  ? ?Expected Discharge Plan: [place] ?Barriers to Discharge: Continued Medical Work up ? ? ?Patient Goals and CMS Choice ?Patient states their goals for this hospitalization and ongoing recovery are:: patient unable to state- intubated and sedated ?  ?  ? ?Expected Discharge Plan and Services ?Expected Discharge Plan: [place] ?  ?Discharge Planning Services: CM Consult ?  ?Living arrangements for the past 2 months: [place] ?                ?DME Arranged: N/A ?DME Agency: NA ?  ?  ?  ?  ?  ?  ?  ?  ? ?Prior Living Arrangements/Services ?Living arrangements for the past 2 months: [place] ?  ?Patient language and need for interpreter reviewed:: Yes ?       ?Need for Family Participation in Patient Care: Yes (Comment) ?Care giver support system in place?: Yes (comment) (daughter) ?  ?Criminal Activity/Legal Involvement Pertinent to Current Situation/Hospitalization: No - Comment as needed ? ?Activities of Daily Living ?Home Assistive Devices/Equipment: Gilford Rile (specify type) ?ADL Screening (condition at time of admission) ?Patient's cognitive ability adequate to safely complete daily activities?: Yes ?Is the patient deaf or have difficulty hearing?: Yes ?Does the patient have difficulty seeing,  even when wearing glasses/contacts?: No ?Does the patient have difficulty concentrating, remembering, or making decisions?: Yes ?Patient able to express need for assistance with ADLs?: Yes ?Does the patient have difficulty dressing or bathing?: Yes ?Independently performs ADLs?: No ?Communication: Independent ?Dressing (OT): Dependent ?Is this a change from baseline?: Change from baseline, expected to last >3 days ?Grooming: Dependent ?Is this a change from baseline?: Change from baseline, expected to last >3 days ?Feeding: Needs assistance ?Is this a change from baseline?: Change from baseline, expected to last >3 days ?Bathing: Dependent ?Is this a change from baseline?: Change from baseline, expected to last >3 days ?Toileting: Dependent ?Is this a change from baseline?: Change from baseline, expected to last >3days ?In/Out Bed: Dependent ?Is this a change from baseline?: Change from baseline, expected to last >3 days ?Walks in Home: Dependent ?Is this a change from baseline?: Change from baseline, expected to last >3 days ?Does the patient have difficulty walking or climbing stairs?: Yes ?Weakness of Legs: Both ?Weakness of Arms/Hands: Both ? ?Permission Sought/Granted ?  ?  ?   ?   ?   ?   ? ?Emotional Assessment ?Appearance:: Appears stated age ?Attitude/Demeanor/Rapport: Intubated (Following Commands or Not Following Commands) ?Affect (typically observed): Unable to Assess ?  ?Alcohol / Substance Use: Not Applicable ?Psych Involvement: No (comment) ? ?Admission diagnosis:  Weakness [R53.1] ?Proximal limb muscle weakness [M62.81] ?Dysphagia, unspecified type [R13.10] ?Subacute neurologic deficit [R29.818] ?Patient Active Problem List  ? Diagnosis Date Noted  ?  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 Weakness 11/04/2021  ? Type 2 diabetes mellitus with hyperlipidemia ([place]) 11/04/2021  ? Obesity (BMI 30-39.9) 11/04/2021  ? Subacute neurologic deficit 11/04/2021  ? CKD (chronic kidney disease) stage 4,  GFR 15-29 ml/min (HCC)   ? History of TIA  08/2019(transient ischemic attack)   ? Fall at home, initial encounter 02/10/2021  ? ?PCP:  [place] ?Pharmacy:   ?[facility] [place], [place] AT [facility] [address] ?[place] ?[place] [address] ?Phone: [phone] Fax: [phone] ? ? ? ? ?Social Determinants of Health (SDOH) Interventions ?  ? ?Readmission Risk Interventions ?   ? View : No data to display.  ?  ?  ?  ? ? ? ?

## 2021-11-08 NOTE — Progress Notes (Signed)
?   11/08/21 1000  ?Clinical Encounter Type  ?Visited With Patient and family together  ?Visit Type Initial;Critical Care  ?Spiritual Encounters  ?Spiritual Needs Prayer  ? ?Chaplain provided support through compassionate conversation and prayer ?

## 2021-11-08 NOTE — Progress Notes (Addendum)
Subjective: ?She was given a bolus of sedation this morning (4 mg versed for agitation during suctioning) ? ?Exam: ?Current vital signs: ?BP (!) 151/54   Pulse 98   Temp 98.5 ?F (36.9 ?C)   Resp 15   Ht '5\' 8"'$  (1.727 m)   Wt 103.2 kg   SpO2 99%   BMI 34.59 kg/m?  ?Vital signs in last 24 hours: ?Temp:  [98.2 ?F (36.8 ?C)-99.6 ?F (37.6 ?C)] 98.5 ?F (36.9 ?C) (04/03 0800) ?Pulse Rate:  [69-120] 98 (04/03 1300) ?Resp:  [0-23] 15 (04/03 1300) ?BP: (96-172)/(19-144) 151/54 (04/03 1300) ?SpO2:  [94 %-100 %] 99 % (04/03 1300) ?FiO2 (%):  [35 %] 35 % (04/03 1124) ?Weight:  [103.1 kg-103.2 kg] 103.2 kg (04/03 0400) ? ? ?Gen: In bed, NAD ? ?Neuro: ?MS: opens eyes, weaknely attempts to squeeze hands and follow commands, but not reliably. More so after noxious stim ?CN: PERRL, blink more brisk on the left than the right, VOR partially suppressed, weak cough ?Sensory motor: Withdraws slightly but not fully antigravity in bilateral upper extremities. 2/5 in bilateral lower extremities as well  ?AVW:PVXYIA ? ?Pertinent Labs: ?Cr 1.73 ? ?Impression: 88 year old female with progressive upper extremity weakness and pharyngeal dysfunction.  At this time, I also strongly suspect pharyngeal cervical brachial variant of Guillain-Barr? syndrome, and would continue to treat it as such. She is s/p day 4 of IVIG, final day planned for 4/4  ? ?Recommendations: ?- Continue IVIG last dose 4/4  ?- Discussed prognosis with family, recovery on weeks to months timeframe and high likelihood of needing tracheostomy  ?- will continue to follow ? ?Lesleigh Noe MD-PhD ?[facility] ?[phone] ?[facility] coverage for [facility] is from 8 AM to 4 AM in-house and 4 PM to 8 PM by telephone/video. 8 PM to 8 AM emergent questions or overnight urgent questions should be addressed to Teleneurology On-call or [facility] neurohospitalist; contact information can be found on AMION ? ?CRITICAL CARE ?Performed by: Lorenza Chick ? ? ?Total critical care time: 35 minutes ? ?Critical care time was exclusive of separately billable procedures and treating other patients. ? ?Critical care was necessary to treat or prevent imminent or life-threatening deterioration. ? ?Critical care was time spent personally by me on the following activities: development of treatment plan with patient and/or surrogate as well as nursing, discussions with consultants, evaluation of patient's response to treatment, examination of patient, obtaining history from patient or surrogate, ordering and performing treatments and interventions, ordering and review of laboratory studies, ordering and review of radiographic studies, pulse oximetry and re-evaluation of patient's condition. ? ? ?

## 2021-11-08 NOTE — Progress Notes (Addendum)
Initial Nutrition Assessment ? ?DOCUMENTATION CODES:  ? ?Obesity unspecified ? ?INTERVENTION:  ? ?Increase Vital HP to 60m/hr continuous  ? ?Free water flushes 377mq4 hours to maintain tube patency  ? ?Regimen provides 1440kcal/day, 126g/day protein and 138369may of free water  ? ?Pt at mild refeed risk; recommend monitor potassium, magnesium and phosphorus labs daily until stable ? ?NUTRITION DIAGNOSIS:  ? ?Inadequate oral intake related to inability to eat (pt sedated and ventilated) as evidenced by NPO status. ? ?GOAL:  ? ?Provide needs based on ASPEN/SCCM guidelines ? ?MONITOR:  ? ?Vent status, Labs, Weight trends, TF tolerance, Skin, I & O's ? ?REASON FOR ASSESSMENT:  ? ?Consult ?Enteral/tube feeding initiation and management ? ?ASSESSMENT:  ? ?[age]o female with h/o CKD IV, TIA, DM, HTN, hyperthyroidism, H. pylori, ANA positive and gout who is admitted with suspected Guillain-Barre Syndrome and aspiration requiring intubation and ventilation on 4/1. ? ?Pt sedated and ventilated. Tube feeds started over the weekend; pt tolerating well. Per chart, pt is up ~11lbs since admit. Pt +5.7L on her I & Os. Pt appears fairly weight stable pta. Family at bedside reports pt with good appetite and oral intake at baseline. No BM since 3/31; pt is receiving bowel regimen.  ? ?Medications reviewed and include: aspirin, colace, lovenox, pepcid, insulin, miralax, senokot, NaCl '@75ml'$ /hr ? ?Labs reviewed: Na 133(L), K 4.1 wnl, BUN 43(H), creat 1.43(H), P 2.9 wnl, Mg 2.3 wnl ?Wbc- 18.8(H), Hgb 10.3(L), Hct 32.8(L) ?Cbgs- 205, 217, 198 x 24 hrs ?AIC 6.9(H)- 3/30 ? ?Patient is currently intubated on ventilator support ?MV: 10.14 L/min ?Temp (24hrs), Avg:99 ?F (37.2 ?C), Min:98.2 ?F (36.8 ?C), Max:99.6 ?F (37.6 ?C) ? ?Propofol: none  ? ?MAP- >67m28m ? ?UOP- 1405ml53m?NUTRITION - FOCUSED PHYSICAL EXAM: ? ?Flowsheet Row Most Recent Value  ?Orbital Region Mild depletion  ?Upper Arm Region No depletion  ?Thoracic and Lumbar Region  No depletion  ?Buccal Region No depletion  ?Temple Region Mild depletion  ?Clavicle Bone Region No depletion  ?Clavicle and Acromion Bone Region No depletion  ?Scapular Bone Region No depletion  ?Dorsal Hand No depletion  ?Patellar Region No depletion  ?Anterior Thigh Region No depletion  ?Posterior Calf Region No depletion  ?Edema (RD Assessment) None  ?Hair Reviewed  ?Eyes Reviewed  ?Mouth Reviewed  ?Skin Reviewed  ?Nails Reviewed  ? ?Diet Order:   ?Diet Order   ? ?       ?  Diet NPO time specified  Diet effective now       ?  ? ?  ?  ? ?  ? ?EDUCATION NEEDS:  ? ?No education needs have been identified at this time ? ?Skin:  Skin Assessment: Reviewed RN Assessment (ecchymosis) ? ?Last BM:  3/31 ? ?Height:  ? ?Ht Readings from Last 1 Encounters:  ?10/27/2021 '5\' 8"'$  (1.727 m)  ? ? ?Weight:  ? ?Wt Readings from Last 1 Encounters:  ?11/08/21 103.2 kg  ? ? ?Ideal Body Weight:  63.6 kg ? ?BMI:  Body mass index is 34.59 kg/m?. ? ?Estimated Nutritional Needs:  ? ?Kcal:  1135-1445kcal/day ? ?Protein:  >130g/day ? ?Fluid:  1.7-1.9L/day ? ?CaseyKoleen DistanceRD, LDN ?Please refer to AMION for RD and/or RD on-call/weekend/after hours pager ? ?

## 2021-11-09 DIAGNOSIS — Z7189 Other specified counseling: Secondary | ICD-10-CM | POA: Diagnosis not present

## 2021-11-09 DIAGNOSIS — G610 Guillain-Barre syndrome: Secondary | ICD-10-CM | POA: Diagnosis not present

## 2021-11-09 LAB — TRIGLYCERIDES: Triglycerides: 59 mg/dL (ref <150)

## 2021-11-09 LAB — CULTURE, RESPIRATORY W GRAM STAIN: Culture: NORMAL

## 2021-11-09 LAB — CBC WITH DIFFERENTIAL/PLATELET
Abs Immature Granulocytes: 0.35 10*3/uL — ABNORMAL HIGH (ref 0.00–0.07)
Basophils Absolute: 0.1 10*3/uL (ref 0.0–0.1)
Basophils Relative: 0 %
Eosinophils Absolute: 0 10*3/uL (ref 0.0–0.5)
Eosinophils Relative: 0 %
HCT: 31.7 % — ABNORMAL LOW (ref 36.0–46.0)
Hemoglobin: 10.4 g/dL — ABNORMAL LOW (ref 12.0–15.0)
Immature Granulocytes: 2 %
Lymphocytes Relative: 7 %
Lymphs Abs: 1.5 10*3/uL (ref 0.7–4.0)
MCH: 30.1 pg (ref 26.0–34.0)
MCHC: 32.8 g/dL (ref 30.0–36.0)
MCV: 91.6 fL (ref 80.0–100.0)
Monocytes Absolute: 2 10*3/uL — ABNORMAL HIGH (ref 0.1–1.0)
Monocytes Relative: 9 %
Neutro Abs: 18.3 10*3/uL — ABNORMAL HIGH (ref 1.7–7.7)
Neutrophils Relative %: 82 %
Platelets: 367 10*3/uL (ref 150–400)
RBC: 3.46 MIL/uL — ABNORMAL LOW (ref 3.87–5.11)
RDW: 14.7 % (ref 11.5–15.5)
WBC: 22.2 10*3/uL — ABNORMAL HIGH (ref 4.0–10.5)
nRBC: 0 % (ref 0.0–0.2)

## 2021-11-09 LAB — GLUCOSE, CAPILLARY
Glucose-Capillary: 216 mg/dL — ABNORMAL HIGH (ref 70–99)
Glucose-Capillary: 238 mg/dL — ABNORMAL HIGH (ref 70–99)
Glucose-Capillary: 246 mg/dL — ABNORMAL HIGH (ref 70–99)
Glucose-Capillary: 238 mg/dL — ABNORMAL HIGH (ref 70–99)
Glucose-Capillary: 211 mg/dL — ABNORMAL HIGH (ref 70–99)

## 2021-11-09 LAB — BASIC METABOLIC PANEL
Anion gap: 5 (ref 5–15)
BUN: 42 mg/dL — ABNORMAL HIGH (ref 8–23)
CO2: 20 mmol/L — ABNORMAL LOW (ref 22–32)
Calcium: 8.6 mg/dL — ABNORMAL LOW (ref 8.9–10.3)
Chloride: 107 mmol/L (ref 98–111)
Creatinine, Ser: 1.3 mg/dL — ABNORMAL HIGH (ref 0.44–1.00)
GFR, Estimated: 40 mL/min — ABNORMAL LOW (ref >60)
Glucose, Bld: 249 mg/dL — ABNORMAL HIGH (ref 70–99)
Potassium: 4.1 mmol/L (ref 3.5–5.1)
Sodium: 132 mmol/L — ABNORMAL LOW (ref 135–145)

## 2021-11-09 LAB — MISC LABCORP TEST (SEND OUT): Labcorp test code: 140385

## 2021-11-09 LAB — MYELIN BASIC PROTEIN, CSF: Myelin Basic Protein: 7.6 ng/mL — ABNORMAL HIGH (ref 0.0–5.6)

## 2021-11-09 MED ORDER — FUROSEMIDE 10 MG/ML IJ SOLN
40.0000 mg | Freq: Once | INTRAMUSCULAR | Status: AC
  Administered 2021-11-09: 40 mg via INTRAVENOUS
  Filled 2021-11-09: qty 4

## 2021-11-09 MED ORDER — OXYCODONE HCL 5 MG PO TABS
5.0000 mg | ORAL_TABLET | Freq: Four times a day (QID) | ORAL | Status: DC
  Administered 2021-11-09 – 2021-11-13 (×15): 5 mg
  Filled 2021-11-09 (×15): qty 1

## 2021-11-09 MED ORDER — DEXMEDETOMIDINE HCL IN NACL 400 MCG/100ML IV SOLN
0.4000 ug/kg/h | INTRAVENOUS | Status: DC
  Administered 2021-11-09: 0.4 ug/kg/h via INTRAVENOUS
  Filled 2021-11-09: qty 100

## 2021-11-09 NOTE — Progress Notes (Signed)
Unable to perform patient education due to patient being on vent/sedated. ?

## 2021-11-09 NOTE — Progress Notes (Signed)
Pt did not tolerated CPT Bed, increased HR >103's and increased agitation ?

## 2021-11-09 NOTE — Progress Notes (Signed)
PHARMACY CONSULT NOTE - FOLLOW UP ? ?Pharmacy Consult for Electrolyte Monitoring and Replacement  ? ?Recent Labs: ?Potassium (mmol/L)  ?Date Value  ?11/09/2021 4.1  ?08/09/2013 3.8  ? ?Magnesium (mg/dL)  ?Date Value  ?11/08/2021 2.3  ? ?Calcium (mg/dL)  ?Date Value  ?11/09/2021 8.6 (L)  ? ?Calcium, Total (mg/dL)  ?Date Value  ?08/09/2013 9.7  ? ?Albumin (g/dL)  ?Date Value  ?10/11/2021 3.5  ?08/09/2013 3.7  ? ?Phosphorus (mg/dL)  ?Date Value  ?11/08/2021 2.9  ? ?Sodium (mmol/L)  ?Date Value  ?11/09/2021 132 (L)  ?08/09/2013 137  ? ?Corr Ca 9 mg/dl ? ?Assessment: ?65 YOF presenting with weakness. Admitted with Guillain Barre syndrome, and severe respiratory failure on mechanical ventilation. ? ?IVIG 10% 400 mg/kg every 24 hours ?Tube Feeds 60 ml/hr ?Free water 30 ml every 4 hours ?NS '@75'$  ml/hr ? ?Scr 1.73 > 1.30 ? ?Goal of Therapy:  ?Electrolytes WNL ? ?Plan:  ?Na trending down. Will follow trend ?Follow up labs in AM ? ? ? ?Wynelle Cleveland, PharmD ?Pharmacy Resident  ?11/09/2021 ?8:00 AM ? ?

## 2021-11-09 NOTE — Progress Notes (Signed)
OT Cancellation Note ? ?Patient Details ?Name: Darnette Lampron Tuel ?MRN: [id number] ?DOB: 1933-07-12 ? ? ?Cancelled Treatment:    Reason Eval/Treat Not Completed: Medical issues which prohibited therapy;Other (comment) (Per discussion with PT and chart review, rehab to  hold at this time to allow family time for processing goals of care/decision making.  Will reattempt as able and if pt is medically appropriate.) ? ?Shanon Payor, OTD OTR/L  ?11/09/21, 4:20 PM  ?

## 2021-11-09 NOTE — Progress Notes (Signed)
PT Cancellation Note ? ?Patient Details ?Name: Amber Fields ?MRN: [id number] ?DOB: 1932/12/24 ? ? ?Cancelled Treatment:    Reason Eval/Treat Not Completed:  (Patient remains intubated and sedated; daughter at bedside.  Per discussion with primary RN, family/physician with ongoing discussions re: goals of care; recommends rehab hold at this time to allow time for processing, decision-making. Will re-attempt next date as medically appropriate and patient/family able to participate with ROM education.) ? ? ?Sarrinah Gardin H. Owens Shark, PT, DPT, NCS ?11/09/21, 3:48 PM ?[phone] ? ?

## 2021-11-09 NOTE — Progress Notes (Signed)
eLink Physician-Brief Progress Note ?Patient Name: Amber Fields ?DOB: 1932-12-19 ?MRN: [id number] ? ? ?Date of Service ? 11/09/2021  ?HPI/Events of Note ? Agitation  Ventilator asynchrony - Patient awake and asynchronous with the ventilator. Intermittent sedation not effective. Nursing request for Precedex IV infusion.   ?eICU Interventions ? Plan: ?Precedex IV infusion. Titrate to RASS = 0 to -1.   ? ? ? ?Intervention Category ?Major Interventions: Delirium, psychosis, severe agitation - evaluation and management ? ?Amber Fields ?11/09/2021, 10:50 PM ?

## 2021-11-09 NOTE — Progress Notes (Signed)
EKG performed- Dr. Verlee Monte reviewed. Patient in and out of Afib. At this time will add oxycodone for patient comfort. When patient stimulated becomes agitated and heart rate increases in and out of afib. Continue to assess.  ?

## 2021-11-09 NOTE — Progress Notes (Signed)
Inpatient Diabetes Program Recommendations ? ?AACE/ADA: New Consensus Statement on Inpatient Glycemic Control (2015) ? ?Target Ranges:  Prepandial:   less than 140 mg/dL ?     Peak postprandial:   less than 180 mg/dL (1-2 hours) ?     Critically ill patients:  140 - 180 mg/dL  ? ?Lab Results  ?Component Value Date  ? GLUCAP 246 (H) 11/09/2021  ? HGBA1C 6.9 (H) 11/04/2021  ? ? ?Review of Glycemic Control ? Latest Reference Range & Units 11/08/21 07:14 11/08/21 11:22 11/08/21 15:11 11/08/21 19:18 11/08/21 23:09 11/09/21 03:47 11/09/21 07:44 11/09/21 11:13  ?Glucose-Capillary 70 - 99 mg/dL 217 (H) 205 (H) 209 (H) 225 (H) 207 (H) 216 (H) 238 (H) 246 (H)  ? ?Diabetes history: DM 2 ?Outpatient Diabetes medications: Glimepiride 4 mg Daily, Actos 15 mg Daily ?Current orders for Inpatient glycemic control:  ?Novolog 0-20 untis Q4 ?Novolog 4 units Q4 Tube Feed Coverage ? ?Inpatient Diabetes Program Recommendations:   ? ?- Consider increasing Tube Feed Coverage to Novolog 8 units Q4 hours ? ?Thanks, ? ?Tama Headings RN, MSN, BC-ADM ?Inpatient Diabetes Coordinator ?Team Pager [phone] (8a-5p) ? ?

## 2021-11-09 NOTE — Progress Notes (Signed)
? ?NAME:  Amber Fields, MRN:  [id number], DOB:  01/07/1933, LOS: 5 ?ADMISSION DATE:  10/15/2021, CONSULTATION DATE:  11/05/2021 ?REFERRING MD:  Dr. Leslye Peer, CHIEF COMPLAINT:  Upper extremity weakness, dysarthria/dysphagia  ? ?Brief Pt Description / Synopsis:  ?88 y.o. 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  Transferred to [facility] 3/31 with PCCM consulted to follow respiratory status and ability to protect her airway ?Progressive rep failure and intubated 4/1 ? ?History of Present Illness/SYNOPSIS  ? 88 year old female with a past medical history as listed below who presented to [facility] ED on 10/26/2021 due to complaints of abrupt onset of bilateral upper extremity weakness. ? ?She was in her normal state of health until 2:30 AM on Monday evening, where she noticed bilateral upper extremity weakness, along with dysarthria.  Both of these have progressively worsened. ? ?Of note she was seen at [facility] on 3/28 and had workup including MRI and MRA of the brain which showed no acute findings, thus was discharged home. ? ?She was admitted by the hospitalist with Neurology consultation.  Concern for cervical-pharyngeal-brachial variant of guillian-barre syndrome. ? ?On 3/31, she was noted to have increased spitting of oral secretions, and failed her swallow evaluation.  NIF was 14.  She was transferred to [facility] with PCCM consultation for close monitoring of respiratory status.  ? ? ?Pertinent  Medical History  ?Chronic kidney disease Stage 4 ?Diabetes mellitus ?Hyperparathyroidism ?Hypertension ?TIA ?MRI BRAIN WNL 3/30 ?MRI CERVICAL SPINE 3/30 ?C3-C4 moderate spinal canal stenosis with moderate right and mild ?left neural foraminal narrowing. ?C6-C7 moderate spinal canal stenosis with mild right neural ?foraminal narrowing. ?C5-C6 mild spinal canal stenosis and mild right neural foraminal ?narrowing. ? C2-C3 mild spinal canal stenosis, without neural  foraminal ?narrowing. ?C4-C5 moderate to severe left neural foraminal narrowing. ? ?Micro Data:  ?3/30: SARS-CoV-2 & Influenza PCR>>negative ?3/31: MRSA PCR>>negative ? ?Antimicrobials:  ?N/A ? ?Significant Hospital Events: ?Including procedures, antibiotic start and stop dates in addition to other pertinent events   ?3/29: Presented to ED in evening.  Admitted by Hospitalist.  Neurology consulted ?3/30: Seen by Neurology, concern for Guillain-barre syndrome given clinical presentation. ?3/31:  Pt with continued dysarthria/dysphagia.  Spitting out saliva, failed bedside swallow evaluation.  Transferred to Stepdown with PCCM consultation to closely follow respiratory status ?4/1 patient emergently intubated ?4/2 remains intubated and sedated ? ?Interim History / Subjective:  ?No acute issues. Neurology has discussed expected trajectory with family including that tracheostomy would probably need to be considered eventually if aggressive measures are continued to facilitate recovery.  ? ?Objective   ?Blood pressure (!) 159/63, pulse (!) 104, temperature 98.8 ?F (37.1 ?C), temperature source Axillary, resp. rate (!) 24, height '5\' 8"'$  (1.727 m), weight 103 kg, SpO2 97 %. ?   ?Vent Mode: PSV ?FiO2 (%):  [21 %-35 %] 30 % ?Set Rate:  [16 bmp] 16 bmp ?Vt Set:  [450 mL] 450 mL ?PEEP:  [5 cmH20] 5 cmH20 ?Pressure Support:  [5 cmH20] 5 cmH20 ?Plateau Pressure:  [17 cmH20] 17 cmH20  ? ?Intake/Output Summary (Last 24 hours) at 11/09/2021 0820 ?Last data filed at 11/09/2021 0600 ?Gross per 24 hour  ?Intake 1335.71 ml  ?Output 2475 ml  ?Net -1139.29 ml  ? ?Filed Weights  ? 11/07/21 1730 11/08/21 0400 11/09/21 0443  ?Weight: 103.1 kg 103.2 kg 103 kg  ? ? ?REVIEW OF SYSTEMS ?Unable to obtain, pt is intubated ? ? ?PHYSICAL EXAMINATION: ? ?GENERAL:intubated ?MOUTH: MMM, some oral secretions, suctioned ?NECK: Supple.  ?PULMONARY:  still very coarse, +secretions in ballard. Equal chest rise. ?CARDIOVASCULAR: tachy, RR no murmur   ?GASTROINTESTINAL: Soft, nontender, -distended. Positive bowel sounds.  ?MUSCULOSKELETAL: No swelling, clubbing, or edema.  ?NEUROLOGIC: drowsy but arouses to voice ?SKIN:intact,warm,dry ? ?Labs reviewed WBC 22.2, RQ pending ? ?No new imaging ? ?Assessment & Plan:  ? ?Acute hypoxic, hypercapnic respiratory failure ?Due to variant of GBS, aspiration pneumonitis. Suspected hypercapnic respiratory failure clinically but not detected on ABG (obtained post intubation.  ?-Tracheal aspirate culture pending ?-full vent support ?-PAD protocol, VAP bundle ?-Keep sedation light ?-pulmonary toilet ?-SAT/SBT although expect that IVIG will take a while to get her strong enough to seriously consider trial of extubation ? ?Diagnosis of ACUTE GBS  ?- neuro following ?- IVIG per neuro, started 3/31 ? ?CKD ?- trend Bmet ?- limit nephrotoxins ? ?Leukocytosis ?- trend CBC ?- trach aspirate pending ? ? ? ? ?Best Practice (right click and "Reselect all SmartList Selections" daily)  ?Diet/type: TF ?DVT prophylaxis: LOVENOX ?GI prophylaxis: PEPCID ?Lines: N/A ?Foley:  N/A ?Code Status:  full code - encouraged family yesterday to consider whether or not she would want CPR/shocks in event of cardiac arrest, they were undecided. Today I will discuss tracheostomy with them and whether or not this is something they think she would want.  ? ?This patient is critically ill with acute hypoxic respiratory failure; which, requires frequent high complexity decision making, assessment, support, evaluation, and titration of therapies. This was completed through the application of advanced monitoring technologies and extensive interpretation of multiple databases. During this encounter critical care time was devoted to patient care services described in this note for 35 minutes. ? ? ?Walker Shadow  ?[place] ? ? ? ? ?

## 2021-11-09 NOTE — Progress Notes (Signed)
?   11/09/21 1400  ?Clinical Encounter Type  ?Visited With Patient and family together  ?Visit Type Follow-up;Critical Care  ?Spiritual Encounters  ?Spiritual Needs Prayer  ? ?Chaplain provided ongoing support and prayer ?

## 2021-11-10 ENCOUNTER — Inpatient Hospital Stay: Payer: Medicare Other

## 2021-11-10 DIAGNOSIS — G610 Guillain-Barre syndrome: Secondary | ICD-10-CM | POA: Diagnosis not present

## 2021-11-10 DIAGNOSIS — Z7189 Other specified counseling: Secondary | ICD-10-CM | POA: Diagnosis not present

## 2021-11-10 LAB — CBC
HCT: 29.1 % — ABNORMAL LOW (ref 36.0–46.0)
Hemoglobin: 9.4 g/dL — ABNORMAL LOW (ref 12.0–15.0)
MCH: 29.7 pg (ref 26.0–34.0)
MCHC: 32.3 g/dL (ref 30.0–36.0)
MCV: 92.1 fL (ref 80.0–100.0)
Platelets: 378 10*3/uL (ref 150–400)
RBC: 3.16 MIL/uL — ABNORMAL LOW (ref 3.87–5.11)
RDW: 15.3 % (ref 11.5–15.5)
WBC: 20.5 10*3/uL — ABNORMAL HIGH (ref 4.0–10.5)
nRBC: 0 % (ref 0.0–0.2)

## 2021-11-10 LAB — BASIC METABOLIC PANEL
Anion gap: 5 (ref 5–15)
BUN: 52 mg/dL — ABNORMAL HIGH (ref 8–23)
CO2: 22 mmol/L (ref 22–32)
Calcium: 8.7 mg/dL — ABNORMAL LOW (ref 8.9–10.3)
Chloride: 106 mmol/L (ref 98–111)
Creatinine, Ser: 1.3 mg/dL — ABNORMAL HIGH (ref 0.44–1.00)
GFR, Estimated: 40 mL/min — ABNORMAL LOW (ref >60)
Glucose, Bld: 270 mg/dL — ABNORMAL HIGH (ref 70–99)
Potassium: 4.4 mmol/L (ref 3.5–5.1)
Sodium: 133 mmol/L — ABNORMAL LOW (ref 135–145)

## 2021-11-10 LAB — GLUCOSE, CAPILLARY
Glucose-Capillary: 201 mg/dL — ABNORMAL HIGH (ref 70–99)
Glucose-Capillary: 202 mg/dL — ABNORMAL HIGH (ref 70–99)
Glucose-Capillary: 221 mg/dL — ABNORMAL HIGH (ref 70–99)
Glucose-Capillary: 234 mg/dL — ABNORMAL HIGH (ref 70–99)
Glucose-Capillary: 235 mg/dL — ABNORMAL HIGH (ref 70–99)
Glucose-Capillary: 246 mg/dL — ABNORMAL HIGH (ref 70–99)
Glucose-Capillary: 252 mg/dL — ABNORMAL HIGH (ref 70–99)

## 2021-11-10 MED ORDER — FUROSEMIDE 10 MG/ML IJ SOLN
40.0000 mg | Freq: Once | INTRAMUSCULAR | Status: AC
  Administered 2021-11-10: 40 mg via INTRAVENOUS
  Filled 2021-11-10: qty 4

## 2021-11-10 NOTE — Progress Notes (Signed)
?   11/10/21 2100  ?Clinical Encounter Type  ?Visited With Patient  ?Visit Type Initial  ?Referral From Nurse  ?Consult/Referral To Chaplain  ? ?Chaplain responded to nurse consult. Chaplain visited with patient. Chaplain provided a compassionate presence and prayer. Patient followed me with her eyes but not able to communicate.  ?

## 2021-11-10 NOTE — Progress Notes (Signed)
PHARMACY CONSULT NOTE - FOLLOW UP ? ?Pharmacy Consult for Electrolyte Monitoring and Replacement  ? ?Recent Labs: ?Potassium (mmol/L)  ?Date Value  ?11/10/2021 4.4  ?08/09/2013 3.8  ? ?Magnesium (mg/dL)  ?Date Value  ?11/08/2021 2.3  ? ?Calcium (mg/dL)  ?Date Value  ?11/10/2021 8.7 (L)  ? ?Calcium, Total (mg/dL)  ?Date Value  ?08/09/2013 9.7  ? ?Albumin (g/dL)  ?Date Value  ?10/22/2021 3.5  ?08/09/2013 3.7  ? ?Phosphorus (mg/dL)  ?Date Value  ?11/08/2021 2.9  ? ?Sodium (mmol/L)  ?Date Value  ?11/10/2021 133 (L)  ?08/09/2013 137  ? ?Corrected Calcium 9.1 mg/dl ? ?Assessment: ?5 YOF presenting with weakness. Admitted with Guillain Barre syndrome, and severe respiratory failure on mechanical ventilation. ? ?IVIG 10% 400 mg/kg every 24 hours x 5 days completed 4/4 ? ?Tube Feeds 60 ml/hr ?Free water 30 ml every 4 hours ?NS '@75'$  ml/hr ? ?Goal of Therapy:  ?Electrolytes WNL ? ?Plan:  ?No electrolyte replacement warranted 4/5. ?Follow up labs in AM ? ? ?Wynelle Cleveland, PharmD ?Pharmacy Resident  ?11/10/2021 ?8:04 AM ? ?

## 2021-11-10 NOTE — Progress Notes (Signed)
ID:  ?88 y.o. with HTN, CKD, HLD, Obesity, with c/f pharyngeal cervical brachial variant of Guillain-Barr? syndrome,  ?Symptom onset 3/27 (bilateral UE weakness) ?Admitted 3/30 (LOS 6) ?Intubated 4/1  ?IVIG completed 4/4 (2 g / kg over 5 days) ? ?Subjective: ?Opens eyes slightly to voice, minimally interactive ? ?Exam: ?Current vital signs: ?BP (!) 133/49   Pulse 98   Temp 99.7 ?F (37.6 ?C) (Oral)   Resp 20   Ht '5\' 8"'$  (1.727 m)   Wt 106.3 kg   SpO2 96%   BMI 35.63 kg/m?  ?Vital signs in last 24 hours: ?Temp:  [98.8 ?F (37.1 ?C)-99.7 ?F (37.6 ?C)] 99.7 ?F (37.6 ?C) (04/05 6712) ?Pulse Rate:  [78-118] 98 (04/05 0900) ?Resp:  [11-30] 20 (04/05 0900) ?BP: (99-159)/(42-83) 133/49 (04/05 0900) ?SpO2:  [95 %-100 %] 96 % (04/05 0900) ?FiO2 (%):  [30 %] 30 % (04/05 0758) ?Weight:  [106.3 kg] 106.3 kg (04/05 0500) ? ? ?Gen: In bed, intubated  ? ?Neuro: ?MS: opens eyes, weakly attempts to squeeze hands and follow commands, but not reliably.  ?CN: PERRL, VOR incomplete, does not move eyes much in either direction to orient to voice but appears to attempt to do so, corneals intact to eyelash brush. weak cough ?Sensory motor: RUE more brisk than the LUE (withdraws and localizes vs. Minimal movement). Moves both lower extremities 2/5 spontaneous movement more than to command ?WPY:KDXIPJ on last eval ? ?Pertinent Labs: ? ?Basic Metabolic Panel: ?Recent Labs  ?Lab 11/06/21 ?0507 11/06/21 ?1317 11/06/21 ?1718 11/07/21 ?8250 11/07/21 ?1701 11/08/21 ?5397 11/09/21 ?6734 11/10/21 ?0434  ?NA 136  --   --  132*  --  133* 132* 133*  ?K 4.7  --   --  3.9  --  4.1 4.1 4.4  ?CL 103  --   --  105  --  107 107 106  ?CO2 22  --   --  20*  --  21* 20* 22  ?GLUCOSE 88  --   --  200*  --  219* 249* 270*  ?BUN 31*  --   --  38*  --  43* 42* 52*  ?CREATININE 1.60*  --   --  1.73*  --  1.43* 1.30* 1.30*  ?CALCIUM 9.3  --   --  8.5*  --  8.5* 8.6* 8.7*  ?MG 2.1 2.0 1.6*  --  2.4 2.3  --   --   ?PHOS 3.7 3.2 2.7  --  3.3 2.9  --   --   ? ?Lab  Results  ?Component Value Date  ? ALT 21 10/17/2021  ? AST 20 10/11/2021  ? ALKPHOS 97 11/05/2021  ? BILITOT 0.6 10/13/2021  ? ? ?CBC: ?Recent Labs  ?Lab 10/09/2021 ?1411 10/18/2021 ?2136 11/06/21 ?1937 11/07/21 ?9024 11/08/21 ?0973 11/09/21 ?5329 11/10/21 ?0434  ?WBC 13.3*   < > 19.8* 22.7* 18.8* 22.2* 20.5*  ?NEUTROABS 9.2*  --   --  17.5* 15.5* 18.3*  --   ?HGB 12.7   < > 13.2 11.5* 10.3* 10.4* 9.4*  ?HCT 40.1   < > 41.8 36.9 32.8* 31.7* 29.1*  ?MCV 92.8   < > 94.4 95.1 92.9 91.6 92.1  ?PLT 544*   < > 369 350 306 367 378  ? < > = values in this interval not displayed.  ? ? ?Coagulation Studies: ?No results for input(s): LABPROT, INR in the last 72 hours.  ? ? ?Impression: 88 year old female with progressive upper extremity weakness and pharyngeal dysfunction. 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 She has now completed IVIG but remains on the ventilator ? ?Recommendations: ?- Discussed prognosis with family, recovery on weeks to months timeframe and high likelihood of needing tracheostomy given age, dysphagia (literature reviewed with ICU team) ?- Per family's request will also reach out to Dr. Posey Pronto at [facility] ? ?Lesleigh Noe MD-PhD ?[facility] ?[phone] ?[facility] coverage for [facility] is from 8 AM to 4 AM in-house and 4 PM to 8 PM by telephone/video. 8 PM to 8 AM emergent questions or overnight urgent questions should be addressed to Teleneurology On-call or [facility] neurohospitalist; contact information can be found on AMION ? ?CRITICAL CARE ?Performed by: Lorenza Chick ? ? ?Total critical care time: 45 minutes ? ?Critical care time was exclusive of separately billable procedures and treating other patients. ? ?Critical care was necessary to treat or prevent imminent or life-threatening deterioration. ? ?Critical care was time spent personally by me on the following activities: development of treatment plan  with patient and/or surrogate as well as nursing, discussions with consultants, evaluation of patient's response to treatment, examination of patient, obtaining history from patient or surrogate, ordering and performing treatments and interventions, ordering and review of laboratory studies, ordering and review of radiographic studies, pulse oximetry and re-evaluation of patient's condition. ? ?

## 2021-11-10 NOTE — Consult Note (Addendum)
? ?                                                                                ?Consultation Note ?Date: 11/10/2021  ? ?Patient Name: Amber Fields  ?DOB: 1933-01-17  MRN: [id number]  Age / Sex: 88 y.o., female  ?PCP: [facility] ?Referring Physician: Maryjane Hurter, MD ? ?Reason for Consultation: Establishing goals of care ? ?HPI/Patient Profile: 88 y.o. 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  Transferred to [facility] 3/31 with PCCM consulted to follow respiratory status and ability to protect her airway. ? ? ?Progressive rep failure and intubated 4/1. ? ? ?Clinical Assessment and Goals of Care: ?Notes, labs, diagnostics reviewed. Spoke with CCM regarding case.  ? ?Patient is resting in bed on the ventilator. Met with daughter [name] and DIL. She tells me patient was widowed in 2000.  Patient has 2 children, [name] and her brother. She states patient has lived with daughter for the past 3 years, for financial reasons. Functionally, she is fully independent without need for assistance; she continues to drive.  ? ?Daughter discusses patient's "cold" that progressed to weakness and issues with slurred speech that brought her here. ? ?We discuss her diagnosis. They quickly articulate components of the vast amount of information they have been told by CCM and neurology.  ? ?We discuss first considering patient's wishes, any boundaries or limits on care, and acceptable QOL. Daughter states patient has a living will and will bring it in.  ? ?They describe her as being happy all the time. They state she does not want her family to worry about her. She tells me the family did not know she had stage 4 CKD, as she had been going to see nephrology by herself. When they asked about the appointments, she simply would say she had some problems with her kidneys.  ? ?They begin asking questions about tracheostomy, and the facility she would need, and  care from there and recovery. Questions answered. Discussed complications surrounding the need for long term ventilator support. Discussed various scenarios. Discussed acceptable QOL. Discussed prognosis. Questions answered. Discussed an aggressive path and a comfort path. Questions answered regarding transition to comfort in the hospital, and transition to comfort later. ? ? ?      ?SUMMARY OF RECOMMENDATIONS   ?PMT met with family to begin discussions and rapport building. PMT will continue to follow.  ? ? ? ?  ? ?Primary Diagnoses: ?Present on Admission: ? Subacute neurologic deficit ? ? ?I have reviewed the medical record, interviewed the patient and family, and examined the patient. The following aspects are pertinent. ? ?Past Medical History:  ?Diagnosis Date  ? Cancer [facility])   ? skin ca  ? Chronic kidney disease   ? Diabetes mellitus without complication ([place])   ? Hyperparathyroidism ([place])   ? Hypertension   ? Transient cerebral ischemia   ? ?Social History  ? ?Socioeconomic History  ? Marital status: Widowed  ?  Spouse name: Not on file  ? Number of children: Not on file  ? Years of education: Not on file  ? Highest education level: Not on file  ?Occupational History  ?  Not on file  ?Tobacco Use  ? Smoking status: Never  ? Smokeless tobacco: Never  ?Substance and Sexual Activity  ? Alcohol use: Not on file  ? Drug use: Not on file  ? Sexual activity: Not on file  ?Other Topics Concern  ? Not on file  ?Social History Narrative  ? Not on file  ? ?Social Determinants of Health  ? ?Financial Resource Strain: Not on file  ?Food Insecurity: Not on file  ?Transportation Needs: Not on file  ?Physical Activity: Not on file  ?Stress: Not on file  ?Social Connections: Not on file  ? ?Family History  ?Problem Relation Age of Onset  ? Breast cancer Cousin   ?     mat cousin  ? ?Scheduled Meds: ? aspirin  81 mg Per Tube Daily  ? chlorhexidine gluconate (MEDLINE KIT)  15 mL Mouth Rinse BID  ? Chlorhexidine Gluconate Cloth   6 each Topical Daily  ? docusate  100 mg Per Tube BID  ? enoxaparin (LOVENOX) injection  0.5 mg/kg Subcutaneous Q24H  ? famotidine  10 mg Per Tube Daily  ? free water  30 mL Per Tube Q4H  ? insulin aspart  0-20 Units Subcutaneous Q4H  ? insulin aspart  4 Units Subcutaneous Q4H  ? ipratropium-albuterol  3 mL Nebulization Q4H  ? mouth rinse  15 mL Mouth Rinse 10 times per day  ? oxyCODONE  5 mg Per Tube Q6H  ? polyethylene glycol  17 g Per Tube Daily  ? sennosides  5 mL Per Tube QHS  ? sodium chloride flush  3 mL Intravenous Once  ? sodium chloride HYPERTONIC  4 mL Nebulization BID  ? ?Continuous Infusions: ? sodium chloride 75 mL/hr at 11/10/21 0600  ? sodium chloride Stopped (11/06/21 1344)  ? dexmedetomidine (PRECEDEX) IV infusion Stopped (11/10/21 0202)  ? feeding supplement (VITAL HIGH PROTEIN) 1,000 mL (11/09/21 0902)  ? ?PRN Meds:.acetaminophen **OR** acetaminophen (TYLENOL) oral liquid 160 mg/5 mL **OR** acetaminophen, fentaNYL (SUBLIMAZE) injection, midazolam ?Medications Prior to Admission:  ?Prior to Admission medications   ?Medication Sig Start Date End Date Taking? Authorizing Provider  ?acetaminophen (TYLENOL) 500 MG tablet Take 500 mg by mouth every 6 (six) hours as needed.   Yes [provider]  ?aspirin 81 MG EC tablet Take 81 mg by mouth daily.   Yes [provider]  ?atorvastatin (LIPITOR) 40 MG tablet Take 40 mg by mouth daily.   Yes [provider]  ?glimepiride (AMARYL) 4 MG tablet Take 4 mg by mouth daily.   Yes [provider]  ?lisinopril (ZESTRIL) 40 MG tablet Take 40 mg by mouth daily.   Yes [provider]  ?pioglitazone (ACTOS) 15 MG tablet Take 15 mg by mouth daily.   Yes [provider]  ?colchicine 0.6 MG tablet Take 0.6 mg by mouth 2 (two) times daily as needed (acute gout flare).    [provider]  ?guaiFENesin-dextromethorphan (ROBITUSSIN DM) 100-10 MG/5ML syrup Take 10 mLs by mouth every 4 (four) hours as needed for  cough. ?Patient not taking: Reported on 11/02/2021 02/12/21   Val Riles, MD  ?iron polysaccharides (NIFEREX) 150 MG capsule Take 1 capsule (150 mg total) by mouth daily. 02/13/21 05/14/21  Val Riles, MD  ?linagliptin (TRADJENTA) 5 MG TABS tablet Take 5 mg by mouth daily. ?Patient not taking: Reported on 10/17/2021    [provider]  ?Vitamin D, Ergocalciferol, (DRISDOL) 1.25 MG (50000 UNIT) CAPS capsule Take 50,000 Units by mouth once a  week.    [provider]  ? ?Allergies  ?Allergen Reactions  ? Pravastatin Other (See Comments)  ?  Other reaction(s): Dizziness, Muscle Pain  ? Codeine Other (See Comments)  ?  Other reaction(s): Dizziness  ? Ibuprofen Other (See Comments)  ?  Other reaction(s): Dizziness  ? Penicillins   ?  Other reaction(s): Other (See Comments) ?Tongue and facial swelling ?Other reaction(s): Other (See Comments) ?Tongue and facial swelling ?  ? Sulfa Antibiotics Other (See Comments)  ? Lansoprazole Diarrhea  ? ?Review of Systems  ?Unable to perform ROS ? ?Physical Exam ?Constitutional:   ?   Comments: Resting in bed with eyes closed, on ventilator.   ? ? ?Vital Signs: BP (!) 125/45   Pulse (!) 117   Temp 99.7 ?F (37.6 ?C) (Oral)   Resp (!) 22   Ht _0  (1.727 m)   Wt 106.3 kg   SpO2 98%   BMI 35.63 kg/m?  ?Pain Scale: CPOT ?  ?Pain Score: 0-No pain ? ? ?SpO2: SpO2: 98 % ?O2 Device:SpO2: 98 % ?O2 Flow Rate: .  ? ?IO: Intake/output summary:  ?Intake/Output Summary (Last 24 hours) at 11/10/2021 0803 ?Last data filed at 11/10/2021 3887 ?Gross per 24 hour  ?Intake 5791.21 ml  ?Output 3475 ml  ?Net 2316.21 ml  ? ? ?LBM: Last BM Date : 11/05/21 ?Baseline Weight: Weight: 99.8 kg ?Most recent weight: Weight: 106.3 kg     ? ? ? ?Signed by: ?Asencion Gowda, NP ?  ?Please contact Palliative Medicine Team phone at [phone] for questions and concerns.  ?For individual provider: See Amion ? ? ? ? ? ? ? ? ? ? ? ? ? ?

## 2021-11-10 NOTE — Progress Notes (Signed)
OT Cancellation Note ? ?Patient Details ?Name: Amber Fields ?MRN: [id number] ?DOB: 08-25-1932 ? ? ?Cancelled Treatment:    Reason Eval/Treat Not Completed: Medical issues which prohibited therapy. Pt remains lethargic and not able to actively participate in OT intervention at this time. OT to complete orders. Please re-consult when pt is more alert.  ? ?Darleen [place], [place], OTR/L , CBIS ?ascom [phone]  ?11/10/21, 12:22 PM  ?

## 2021-11-10 NOTE — Progress Notes (Signed)
? ?NAME:  Amber Fields, MRN:  [id number], DOB:  [birth year], LOS: 6 ?ADMISSION DATE:  10/29/2021, CONSULTATION DATE:  11/05/2021 ?REFERRING MD:  Dr. Leslye Peer, CHIEF COMPLAINT:  Upper extremity weakness, dysarthria/dysphagia  ? ?Brief Pt Description / Synopsis:  ?88 y.o. 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  Transferred to [facility] 3/31 with PCCM consulted to follow respiratory status and ability to protect her airway ?Progressive rep failure and intubated 4/1 ? ?History of Present Illness/SYNOPSIS  ? 88 year old female with a past medical history as listed below who presented to [facility] ED on 10/28/2021 due to complaints of abrupt onset of bilateral upper extremity weakness. ? ?She was in her normal state of health until 2:30 AM on Monday evening, where she noticed bilateral upper extremity weakness, along with dysarthria.  Both of these have progressively worsened. ? ?Of note she was seen at [facility] on 3/28 and had workup including MRI and MRA of the brain which showed no acute findings, thus was discharged home. ? ?She was admitted by the hospitalist with Neurology consultation.  Concern for cervical-pharyngeal-brachial variant of guillian-barre syndrome. ? ?On 3/31, she was noted to have increased spitting of oral secretions, and failed her swallow evaluation.  NIF was 14.  She was transferred to [facility] with PCCM consultation for close monitoring of respiratory status.  ? ? ?Pertinent  Medical History  ?Chronic kidney disease Stage 4 ?Diabetes mellitus ?Hyperparathyroidism ?Hypertension ?TIA ?MRI BRAIN WNL 3/30 ?MRI CERVICAL SPINE 3/30 ?C3-C4 moderate spinal canal stenosis with moderate right and mild ?left neural foraminal narrowing. ?C6-C7 moderate spinal canal stenosis with mild right neural ?foraminal narrowing. ?C5-C6 mild spinal canal stenosis and mild right neural foraminal ?narrowing. ? C2-C3 mild spinal canal stenosis, without neural  foraminal ?narrowing. ?C4-C5 moderate to severe left neural foraminal narrowing. ? ?Micro Data:  ?3/30: SARS-CoV-2 & Influenza PCR>>negative ?3/31: MRSA PCR>>negative ?4/2: Tracheal aspirate>> normal respiratory flora ? ?Antimicrobials:  ?N/A ? ?Significant Hospital Events: ?Including procedures, antibiotic start and stop dates in addition to other pertinent events   ?3/29: Presented to ED in evening.  Admitted by Hospitalist.  Neurology consulted ?3/30: Seen by Neurology, concern for Guillain-barre syndrome given clinical presentation. ?3/31:  Pt with continued dysarthria/dysphagia.  Spitting out saliva, failed bedside swallow evaluation.  Transferred to Stepdown with PCCM consultation to closely follow respiratory status ?4/1 patient emergently intubated ?4/2 remains intubated and sedated ?4/4: Palliative Care consulted to assist with goals of care and whether to pursue tracheostomy ?4/5: Plan for diuresis.  Allowing family more time for decision regarding Tracheostomy. ? ?Interim History / Subjective:  ?-Overnight with agitation and vent asynchrony ~ was ordered for Precedex ?-This morning Precedex is off, pt is drowsy but will arouse to voice and follow commands with BLE ~ can become agitated with stimulation ?-Afebrile, hemodynamically stable, no vasopressors ?-Renal function unchanged, Creatinine 1.3 (1.3), UOP 3.4 L last 24 hrs (net + 6.8 L since admit) ?-Discussed with Dr. Verlee Monte, given + fluid status, will stop IV fluids and Diurese today (40 mg Lasix x1 dose) ?-Tracheal aspirate resulted with normal respiratory flora ?-Leukocytosis slowly trending down to 20.5 K (22.2), afebrile ?-Palliative Care to meet with family regarding GOC and whether to proceed with Tracheostomy vs. 1 way extubation ? ?Objective   ?Blood pressure (!) 131/51, pulse 81, temperature 98.9 ?F (37.2 ?C), temperature source Axillary, resp. rate (!) 21, height 5' 8"  (1.727 m), weight 106.3 kg, SpO2 98 %. ?   ?Vent Mode: PSV ?FiO2 (%):  [30  %]  30 % ?Set Rate:  [16 bmp] 16 bmp ?PEEP:  [5 cmH20] 5 cmH20 ?Pressure Support:  [5 cmH20] 5 cmH20 ?Plateau Pressure:  [16 cmH20-18 cmH20] 16 cmH20  ? ?Intake/Output Summary (Last 24 hours) at 11/10/2021 0751 ?Last data filed at 11/10/2021 0160 ?Gross per 24 hour  ?Intake 5791.21 ml  ?Output 3475 ml  ?Net 2316.21 ml  ? ? ?Filed Weights  ? 11/08/21 0400 11/09/21 0443 11/10/21 0500  ?Weight: 103.2 kg 103 kg 106.3 kg  ? ? ?REVIEW OF SYSTEMS ?Unable to obtain, pt is intubated ? ? ?PHYSICAL EXAMINATION: ? ?GENERAL:Acutely ill appearing female, laying in bed, intubated, in NAD ?MOUTH: MMM, orally intubated ?NECK: Supple.  ?PULMONARY: Mechanical breath sounds bilaterally, even, nonlabored, in PS mode on vent ?CARDIOVASCULAR: RRR, s1s2,  no murmur, rubs, or gallops  ?GASTROINTESTINAL: Soft, nontender, -distended. Positive bowel sounds.  ?MUSCULOSKELETAL: No swelling, clubbing, or edema.  ?NEUROLOGIC: drowsy but arouses to voice, follows commands BLE ?SKIN:intact,warm,dry ? ? ? ?Assessment & Plan:  ? ?Acute hypoxic, hypercapnic respiratory failure, Due to variant of GBS & aspiration pneumonitis  ?Suspected hypercapnic respiratory failure clinically but not detected on ABG (obtained post intubation) ?-Full vent support, implement lung protective strategies ?-Plateau pressures less than 30 cm H20 ?-Wean FiO2 & PEEP as tolerated to maintain O2 sats >92% ?-Follow intermittent Chest X-ray & ABG as needed ?-Spontaneous Breathing Trials when respiratory parameters met and mental status permits ~ recovery on weeks to months timeframe and high likelihood of needing tracheostomy for survival given age, dysphagia  ?-Implement VAP Bundle ?-Prn Bronchodilators ?-Pulmonary toilet ?-Tracheal aspirate with normal respiratory flora ?-Diurese 4/5 (40 mg IV Lasix x1 dose)  ?-Palliative Care consulted to assist wth [place] and whether to pursue Tracheostomy ? ?Diagnosis of ACUTE GBS  ?Sedation needs in setting of mechanical ventilation ?-Maintain a  RASS goal of 0 to -1 ?-Precedex as needed to maintain RASS goal ?-Avoid sedating medications as able ?-Daily wake up assessment ?-Neuro following, appreciate input ?-Completed course of IVIG (4 doses) as per Neuro ? ?CKD ?-Monitor I&O's / urinary output ?-Follow BMP ?-Ensure adequate renal perfusion ?-Avoid nephrotoxic agents as able ?-Replace electrolytes as indicated ? ?Leukocytosis ?-Monitor fever curve ?-Trend WBC's  ?-Follow cultures as above ?-Tracheal aspirate with normal respiratory flora ? ? ? ? ? ?Best Practice (right click and "Reselect all SmartList Selections" daily)  ?Diet/type: TF ?DVT prophylaxis: LOVENOX ?GI prophylaxis: PEPCID ?Lines: N/A ?Foley:  N/A ?Code Status:  full code - encouraged family yesterday to consider whether or not she would want CPR/shocks in event of cardiac arrest, they were undecided. Today I will discuss tracheostomy with them and whether or not this is something they think she would want.  ? ?This patient is critically ill with acute hypoxic respiratory failure; which, requires frequent high complexity decision making, assessment, support, evaluation, and titration of therapies. This was completed through the application of advanced monitoring technologies and extensive interpretation of multiple databases.  ? ? ?Critical Care Time: 40 minutes ? ? ?Amber Fields, AGACNP-BC ?[facility] ?Prefer epic messenger for cross cover needs ?If after hours, please call E-link ? ? ? ? ? ?

## 2021-11-10 NOTE — Progress Notes (Addendum)
Inpatient Diabetes Program Recommendations ? ?AACE/ADA: New Consensus Statement on Inpatient Glycemic Control (2015) ? ?Target Ranges:  Prepandial:   less than 140 mg/dL ?     Peak postprandial:   less than 180 mg/dL (1-2 hours) ?     Critically ill patients:  140 - 180 mg/dL  ? ?Lab Results  ?Component Value Date  ? GLUCAP 252 (H) 11/10/2021  ? HGBA1C 6.9 (H) 11/04/2021  ? ? ?Review of Glycemic Control ? Latest Reference Range & Units 11/09/21 07:44 11/09/21 11:13 11/09/21 15:21 11/09/21 19:41 11/10/21 00:08 11/10/21 04:50 11/10/21 07:36  ?Glucose-Capillary 70 - 99 mg/dL 238 (H) ? ?Novolog 11 units 246 (H) ? ?Novolog 11 units 238 (H) ? ?Novolog 11 units 211 (H) ? ?Novolog 11 units 246 (H) ? ?Novolog 11 units 221 (H) ? ?Novolog 11 units 252 (H) ? ?Novolog 15 units  ? ?Diabetes history: DM 2 ?Outpatient Diabetes medications: Glimepiride 4 mg Daily, Actos 15 mg Daily ?Current orders for Inpatient glycemic control:  ?Novolog 0-20 units Q4 hours ?Novolog 4 units Q4 Tube Feed Coverage ? ?Inpatient Diabetes Program Recommendations:   ? ?Pt consistently receiving Novolog 11 units at each administration time. ? ?-  Increase Novolog Tube Feed Coverage to 8 units Q4 hours. ? ?Thanks, ? ?Tama Headings RN, MSN, BC-ADM ?Inpatient Diabetes Coordinator ?Team Pager [phone] (8a-5p) ? ?

## 2021-11-10 NOTE — Progress Notes (Signed)
?   11/10/21 0900  ?Clinical Encounter Type  ?Visited With Patient and family together  ?Visit Type Follow-up  ? ?Chaplain provided follow-up support and the assurance of prayer ?

## 2021-11-10 NOTE — Progress Notes (Addendum)
? ?                                                                                                                                                     ?                                                   ?Daily Progress Note  ? ?Patient Name: Amber Fields       Date: 11/10/2021 ?DOB: February 03, 1933  Age: 88 y.o. MRN#: [id number] ?Attending Physician: Maryjane Hurter, MD ?Primary Care Physician: [facility] ?Admit Date: 10/30/2021 ? ?Reason for Consultation/Follow-up: Establishing goals of care ? ?Subjective: ?Notes and labs reviewed.  Discussed case with CCM NP and CCM MD. ? ?Patient is resting in bed on ventilator.  Daughter Amber Fields is at bedside as well as daughter-in-law.  They voiced optimism for patient's status from a [place]?  perspective.  Discussed concern for the overall picture and the deconditioning that occurs while she is on the ventilator, particularly if she remains on one long-term.  Discussed family considering boundaries for acceptable quality of life. Daughter has a living will with her.  The living will was scanned and a copy placed in the chart.  The living will outlines that if the patient is terminal or death is eminent, she would want to to be kept comfortable and die peacefully and naturally.  She would not want to have artificial hydration or nutrition at that time.  They discussed continuing care to see how she does until the time they need to make a decision on tracheostomy.  Discussed consideration of a DNR status such that if her heart stops she is allowed to die peacefully and naturally- given the advanced directive.  They state they will speak to patient's son about this and consider it further.  They state he is very busy with work with plans of leaving on a work trip Monday.  They state right now, he is at home sick. They are aware that son would need to be part of the conversation to transition to comfort care. ? ?PMT will continue to follow along. ? ?Length of  Stay: 6 ? ?Current Medications: ?Scheduled Meds:  ? aspirin  81 mg Per Tube Daily  ? chlorhexidine gluconate (MEDLINE KIT)  15 mL Mouth Rinse BID  ? Chlorhexidine Gluconate Cloth  6 each Topical Daily  ? docusate  100 mg Per Tube BID  ? enoxaparin (LOVENOX) injection  0.5 mg/kg Subcutaneous Q24H  ? famotidine  10 mg Per Tube Daily  ? Fields water  30 mL Per Tube Q4H  ? insulin aspart  0-20 Units Subcutaneous Q4H  ? insulin aspart  4 Units Subcutaneous Q4H  ? ipratropium-albuterol  3 mL Nebulization Q4H  ? mouth rinse  15 mL Mouth Rinse 10 times per day  ? oxyCODONE  5 mg Per Tube Q6H  ? polyethylene glycol  17 g Per Tube Daily  ? sennosides  5 mL Per Tube QHS  ? sodium chloride flush  3 mL Intravenous Once  ? sodium chloride HYPERTONIC  4 mL Nebulization BID  ? ? ?Continuous Infusions: ? sodium chloride Stopped (11/06/21 1344)  ? dexmedetomidine (PRECEDEX) IV infusion Stopped (11/10/21 0202)  ? feeding supplement (VITAL HIGH PROTEIN) 1,000 mL (11/09/21 0902)  ? ? ?PRN Meds: ?acetaminophen **OR** acetaminophen (TYLENOL) oral liquid 160 mg/5 mL **OR** acetaminophen, fentaNYL (SUBLIMAZE) injection, midazolam ? ?Physical Exam ?Constitutional:   ?   Comments: Eyes open spontaneously but patient does not follow commands.  On ventilator.  ?         ? ?Vital Signs: BP (!) 123/95   Pulse (!) 101   Temp 98.3 ?F (36.8 ?C) (Axillary)   Resp (!) 22   Ht _0  (1.727 m)   Wt 106.3 kg   SpO2 97%   BMI 35.63 kg/m?  ?SpO2: SpO2: 97 % ?O2 Device: O2 Device: Ventilator ?O2 Flow Rate:   ? ?Intake/output summary:  ?Intake/Output Summary (Last 24 hours) at 11/10/2021 1310 ?Last data filed at 11/10/2021 1205 ?Gross per 24 hour  ?Intake 5791.21 ml  ?Output 4625 ml  ?Net 1166.21 ml  ? ?LBM: Last BM Date : 11/05/21 ?Baseline Weight: Weight: 99.8 kg ?Most recent weight: Weight: 106.3 kg ? ? ?Patient Active Problem List  ? Diagnosis Date Noted  ? 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 Weakness 11/04/2021  ? Type 2 diabetes  mellitus with hyperlipidemia ([place]) 11/04/2021  ? Obesity (BMI 30-39.9) 11/04/2021  ? Subacute neurologic deficit 11/04/2021  ? CKD (chronic kidney disease) stage 4, GFR 15-29 ml/min (HCC)   ? History of TIA  08/2019(transient ischemic attack)   ? Fall at home, initial encounter 02/10/2021  ? ? ?Palliative Care Assessment & Plan  ? ? ?Recommendations/Plan: ?A copy of the living will placed in chart and a copy scanned to [place]. ? ?The form outlines that if the patient has a terminal condition, or death is imminent she would want to be kept comfortable and to die peacefully and naturally. ? ? ? ?Code Status: ? ?  ?Code Status Orders  ?(From admission, onward)  ?  ? ? ?  ? ?  Start     Ordered  ? 10/12/2021 1951  Full code  Continuous       ? 10/28/2021 1951  ? ?  ?  ? ?  ? ?Code Status History   ? ? Date Active Date Inactive Code Status Order ID Comments User Context  ? 02/10/2021 1307 02/12/2021 2225 Full Code [id number]  Collier Bullock, MD ED  ? ?  ? ? ?Prognosis: ?Poor overall. ? ?Care plan was discussed with CCM ? ?Thank you for allowing the Palliative Medicine Team to assist in the care of this patient. ? ?Asencion Gowda, NP ? ?Please contact Palliative Medicine Team phone at [phone] for questions and concerns.  ? ? ? ? ? ?

## 2021-11-10 NOTE — Progress Notes (Signed)
11/10/2021 ?APP Darel Hong and I saw and evaluated the patient. Discussed with them and agree with their findings and plan as documented in the their note. ? ?I have seen and evaluated the patient for acute hypoxic respiratory failure ? ?S:  ?Some agitation overnight, placed on precedex. Off of it again this morning. A bit agitated with stimulation. ? ?O: ?Blood pressure (!) 140/54, pulse (!) 107, temperature 99.7 ?F (37.6 ?C), temperature source Oral, resp. rate (!) 26, height '5\' 8"'$  (1.727 m), weight 106.3 kg, SpO2 97 %.  ? ?Exam: ?Gen:       Intubated ?HEENT:  tracking, sclera anicteric ?Neck:      No masses, JVD ?Lungs:    coarse bilaterally, clears with suctioning; equal chest rise ?CV:         tachy irregular rhythm (sinus/PACs) no murmurs ?Abd:      + hypoactive bowel sounds; soft, non-tender; no palpable masses, no distension ?Ext:    trace edema; adequate peripheral perfusion ?Skin:       Warm and dry; no rash ?Neuro:    opens eyes to voice, does not follow commands for me ? ?S Cr stable ?WBCs 20.5 ? ?Resp cx normal flora ? ?A:  ?# Acute hypoxic, presumed hypercapnic respiratory failure ?# Acute GBS ?# CKD ?# Leukocytosis ? ?P:  ?- bronchodilators, pulmonary hygiene ?- diurese for net negative fluid balance ?- PT if more alert ?- expected course of improvement with regard to bulbar function is slow (per neuro, would not be surprised if minimal improvement even by 2 weeks). It is unclear what extent she has diaphragmatic involvement (NIF was only -14 at intubation, but she currently does well on PS with good tidal volumes on only 5/5 for an entire shift) and thus how long to anticipate she'd be vent dependent although the longer she spends with ETT/mech vent with little mobility I do worry about deconditioning leading to prolonged vent dependence far more for her than I would a younger patient. She had surprisingly excellent independent function for an 88 yo however before this illness. I discussed  tracheostomy with her daugher and daughter-in-law including range of possibilities that she could remain vent-dependent with PEG for indefinite period, could progress over period of 1-2 months best case scenario to decannulation and other possibilities between these extremes. I said it's probably not something I'd pursue for my own family member under similar circumstances but that I think reasonable patients/families would make different decisions in this scenario. They will consider it more and ideally let us know soon. ? ?Patient critically ill due to acute hypoxic respiratory failure ?Interventions to address this today: engagement with family to discuss tracheostomy planning vs one-way extubation after trial of time on ventilator ?Risk of deterioration without these interventions is high ? ?I personally spent 35 minutes providing critical care not including any separately billable procedures ? ? ?Walker Shadow ?[place] ?  ?

## 2021-11-10 NOTE — Progress Notes (Signed)
PT Cancellation Note ? ?Patient Details ?Name: Amber Fields ?MRN: [id number] ?DOB: 06/19/1933 ? ? ?Cancelled Treatment:    Reason Eval/Treat Not Completed:  (Per chart review, patient remains intubated and sedated; unable to interact with environment or skilled PT session.  Does not appear to tolerate much stimuli (now requiring precedex).  Will continue to hold on PT services; please re-consult as medically appropriate.) ? ? ?Captola Teschner H. Owens Shark, PT, DPT, NCS ?11/10/21, 12:30 PM ?[phone] ? ?

## 2021-11-10 NOTE — Plan of Care (Signed)
°  Problem: Nutrition: °Goal: Adequate nutrition will be maintained °Outcome: Progressing °  °Problem: Coping: °Goal: Level of anxiety will decrease °Outcome: Progressing °  °Problem: Safety: °Goal: Ability to remain free from injury will improve °Outcome: Progressing °  °

## 2021-11-11 DIAGNOSIS — A419 Sepsis, unspecified organism: Secondary | ICD-10-CM | POA: Diagnosis not present

## 2021-11-11 DIAGNOSIS — N184 Chronic kidney disease, stage 4 (severe): Secondary | ICD-10-CM | POA: Diagnosis not present

## 2021-11-11 DIAGNOSIS — J9602 Acute respiratory failure with hypercapnia: Secondary | ICD-10-CM

## 2021-11-11 DIAGNOSIS — Z7189 Other specified counseling: Secondary | ICD-10-CM | POA: Diagnosis not present

## 2021-11-11 DIAGNOSIS — J9601 Acute respiratory failure with hypoxia: Secondary | ICD-10-CM

## 2021-11-11 DIAGNOSIS — R6521 Severe sepsis with septic shock: Secondary | ICD-10-CM

## 2021-11-11 DIAGNOSIS — G610 Guillain-Barre syndrome: Secondary | ICD-10-CM | POA: Diagnosis not present

## 2021-11-11 DIAGNOSIS — E119 Type 2 diabetes mellitus without complications: Secondary | ICD-10-CM | POA: Diagnosis not present

## 2021-11-11 LAB — RESPIRATORY PANEL BY PCR

## 2021-11-11 LAB — CBC
HCT: 30.5 % — ABNORMAL LOW (ref 36.0–46.0)
Hemoglobin: 9.8 g/dL — ABNORMAL LOW (ref 12.0–15.0)
MCH: 29.4 pg (ref 26.0–34.0)
MCHC: 32.1 g/dL (ref 30.0–36.0)
MCV: 91.6 fL (ref 80.0–100.0)
Platelets: 441 10*3/uL — ABNORMAL HIGH (ref 150–400)
RBC: 3.33 MIL/uL — ABNORMAL LOW (ref 3.87–5.11)
RDW: 15.7 % — ABNORMAL HIGH (ref 11.5–15.5)
WBC: 17.9 10*3/uL — ABNORMAL HIGH (ref 4.0–10.5)
nRBC: 0 % (ref 0.0–0.2)

## 2021-11-11 LAB — BASIC METABOLIC PANEL
Anion gap: 6 (ref 5–15)
BUN: 70 mg/dL — ABNORMAL HIGH (ref 8–23)
CO2: 24 mmol/L (ref 22–32)
Calcium: 8.9 mg/dL (ref 8.9–10.3)
Chloride: 104 mmol/L (ref 98–111)
Creatinine, Ser: 1.47 mg/dL — ABNORMAL HIGH (ref 0.44–1.00)
GFR, Estimated: 35 mL/min — ABNORMAL LOW (ref >60)
Glucose, Bld: 247 mg/dL — ABNORMAL HIGH (ref 70–99)
Potassium: 4.1 mmol/L (ref 3.5–5.1)
Sodium: 134 mmol/L — ABNORMAL LOW (ref 135–145)

## 2021-11-11 LAB — GLUCOSE, CAPILLARY
Glucose-Capillary: 195 mg/dL — ABNORMAL HIGH (ref 70–99)
Glucose-Capillary: 228 mg/dL — ABNORMAL HIGH (ref 70–99)
Glucose-Capillary: 231 mg/dL — ABNORMAL HIGH (ref 70–99)
Glucose-Capillary: 233 mg/dL — ABNORMAL HIGH (ref 70–99)
Glucose-Capillary: 261 mg/dL — ABNORMAL HIGH (ref 70–99)
Glucose-Capillary: 282 mg/dL — ABNORMAL HIGH (ref 70–99)

## 2021-11-11 LAB — MAGNESIUM: Magnesium: 2 mg/dL (ref 1.7–2.4)

## 2021-11-11 LAB — LACTIC ACID, PLASMA: Lactic Acid, Venous: 1.1 mmol/L (ref 0.5–1.9)

## 2021-11-11 LAB — PHOSPHORUS: Phosphorus: 3.5 mg/dL (ref 2.5–4.6)

## 2021-11-11 LAB — HEPARIN LEVEL (UNFRACTIONATED): Heparin Unfractionated: 0.32 IU/mL (ref 0.30–0.70)

## 2021-11-11 LAB — PROCALCITONIN: Procalcitonin: 0.5 ng/mL

## 2021-11-11 MED ORDER — FUROSEMIDE 10 MG/ML IJ SOLN
40.0000 mg | Freq: Once | INTRAMUSCULAR | Status: DC
Start: 2021-11-11 — End: 2021-11-11

## 2021-11-11 MED ORDER — BISACODYL 10 MG RE SUPP
10.0000 mg | Freq: Once | RECTAL | Status: AC
  Administered 2021-11-11: 10 mg via RECTAL
  Filled 2021-11-11: qty 1

## 2021-11-11 MED ORDER — AMIODARONE IV BOLUS ONLY 150 MG/100ML
INTRAVENOUS | Status: AC
  Administered 2021-11-11: 150 mg
  Filled 2021-11-11: qty 100

## 2021-11-11 MED ORDER — INSULIN ASPART 100 UNIT/ML IJ SOLN
8.0000 [IU] | INTRAMUSCULAR | Status: DC
  Administered 2021-11-11 – 2021-11-12 (×8): 8 [IU] via SUBCUTANEOUS
  Filled 2021-11-11 (×7): qty 1

## 2021-11-11 MED ORDER — HEPARIN BOLUS VIA INFUSION
2000.0000 [IU] | Freq: Once | INTRAVENOUS | Status: AC
  Administered 2021-11-11: 2000 [IU] via INTRAVENOUS
  Filled 2021-11-11: qty 2000

## 2021-11-11 MED ORDER — AMIODARONE HCL IN DEXTROSE 360-4.14 MG/200ML-% IV SOLN
30.0000 mg/h | INTRAVENOUS | Status: DC
Start: 2021-11-11 — End: 2021-11-13
  Administered 2021-11-11 – 2021-11-13 (×3): 30 mg/h via INTRAVENOUS
  Filled 2021-11-11 (×4): qty 200

## 2021-11-11 MED ORDER — NOREPINEPHRINE 4 MG/250ML-% IV SOLN
2.0000 ug/min | INTRAVENOUS | Status: DC
  Administered 2021-11-11: 2 ug/min via INTRAVENOUS
  Filled 2021-11-11: qty 250

## 2021-11-11 MED ORDER — COLCHICINE 0.6 MG PO TABS
0.3000 mg | ORAL_TABLET | Freq: Two times a day (BID) | ORAL | Status: DC
  Administered 2021-11-11 – 2021-11-12 (×4): 0.3 mg
  Filled 2021-11-11 (×5): qty 0.5
  Filled 2021-11-11: qty 1

## 2021-11-11 MED ORDER — VANCOMYCIN HCL 2000 MG/400ML IV SOLN
2000.0000 mg | Freq: Once | INTRAVENOUS | Status: AC
  Administered 2021-11-11: 2000 mg via INTRAVENOUS
  Filled 2021-11-11: qty 400

## 2021-11-11 MED ORDER — AMIODARONE HCL IN DEXTROSE 360-4.14 MG/200ML-% IV SOLN
60.0000 mg/h | INTRAVENOUS | Status: DC
  Administered 2021-11-11 (×2): 60 mg/h via INTRAVENOUS
  Filled 2021-11-11: qty 200

## 2021-11-11 MED ORDER — AMIODARONE LOAD VIA INFUSION
150.0000 mg | Freq: Once | INTRAVENOUS | Status: AC
Start: 2021-11-11 — End: 2021-11-11
  Administered 2021-11-11: 150 mg via INTRAVENOUS
  Filled 2021-11-11: qty 83.34

## 2021-11-11 MED ORDER — SODIUM CHLORIDE 0.9 % IV SOLN
2.0000 g | Freq: Three times a day (TID) | INTRAVENOUS | Status: DC
  Administered 2021-11-11 – 2021-11-12 (×3): 2 g via INTRAVENOUS
  Filled 2021-11-11 (×4): qty 10
  Filled 2021-11-11: qty 2

## 2021-11-11 MED ORDER — SODIUM CHLORIDE 0.9 % IV SOLN
250.0000 mL | INTRAVENOUS | Status: DC
  Administered 2021-11-11 – 2021-11-14 (×2): 250 mL via INTRAVENOUS

## 2021-11-11 MED ORDER — HEPARIN (PORCINE) 25000 UT/250ML-% IV SOLN
1150.0000 [IU]/h | INTRAVENOUS | Status: DC
  Administered 2021-11-11: 1200 [IU]/h via INTRAVENOUS
  Administered 2021-11-12 – 2021-11-14 (×3): 1350 [IU]/h via INTRAVENOUS
  Administered 2021-11-15: 1150 [IU]/h via INTRAVENOUS
  Filled 2021-11-11 (×6): qty 250

## 2021-11-11 MED ORDER — VANCOMYCIN VARIABLE DOSE PER UNSTABLE RENAL FUNCTION (PHARMACIST DOSING): Status: DC

## 2021-11-11 MED ORDER — LEVALBUTEROL HCL 1.25 MG/0.5ML IN NEBU
1.2500 mg | INHALATION_SOLUTION | Freq: Four times a day (QID) | RESPIRATORY_TRACT | Status: DC
  Administered 2021-11-11 – 2021-11-14 (×11): 1.25 mg via RESPIRATORY_TRACT
  Filled 2021-11-11 (×12): qty 0.5

## 2021-11-11 NOTE — Progress Notes (Signed)
VAST consult received to obtain USGIV access for vasopressors. Spoke with patient's nurse who stated she already has a USGIV with good blood return at this time and has requested a central line be placed today. ICU RN will reach out if further IV access is needed. ?

## 2021-11-11 NOTE — Progress Notes (Addendum)
? ?                                                                                                                                                     ?                                                   ?Daily Progress Note  ? ?Patient Name: Amber Fields       Date: 11/11/2021 ?DOB: [birth year]  Age: 88 y.o. MRN#: [id number] ?Attending Physician: Jacky Kindle, MD ?Primary Care Physician: [facility] ?Admit Date: 10/11/2021 ? ?Reason for Consultation/Follow-up: Establishing goals of care ? ?Notes and labs reviewed. Spoke with CCM.  ? ?Subjective: ?Patient is resting in bed on ventilator. She appears restless. Daughter and DIL are standing outside room. As I come around the corner into ICU they call me over as they are distressed; patient has been hypotensive and is being placed on vasopressor and amiodarone. They state they are worried and do not know what to do. With discussion, currently they maintain a place of wanting to continue current care. They have not decided on pursuing a tracheostomy. Nursing discusses CCM concerns for PNA. Discussed the impact of this. Discussed what they believe patient's thoughts on timeline would be, as well as acceptable QOL. Reviewed advanced directive with them. They state patient's son is at home sick. They will discuss care and code status with him. At this point they would like the full amount of time to make the decision on tracheostomy.   ? ?Length of Stay: 7 ? ?Current Medications: ?Scheduled Meds:  ? amiodarone  150 mg Intravenous Once  ? aspirin  81 mg Per Tube Daily  ? chlorhexidine gluconate (MEDLINE KIT)  15 mL Mouth Rinse BID  ? Chlorhexidine Gluconate Cloth  6 each Topical Daily  ? colchicine  0.3 mg Per Tube BID  ? docusate  100 mg Per Tube BID  ? famotidine  10 mg Per Tube Daily  ? free water  30 mL Per Tube Q4H  ? insulin aspart  0-20 Units Subcutaneous Q4H  ? insulin aspart  8 Units Subcutaneous Q4H  ? ipratropium-albuterol  3 mL Nebulization Q4H   ? mouth rinse  15 mL Mouth Rinse 10 times per day  ? oxyCODONE  5 mg Per Tube Q6H  ? polyethylene glycol  17 g Per Tube Daily  ? sennosides  5 mL Per Tube QHS  ? vancomycin variable dose per unstable renal function (pharmacist dosing)   Does not apply See admin instructions  ? ? ?Continuous Infusions: ? sodium chloride Stopped (11/06/21 1344)  ? sodium chloride 250 mL (11/11/21 0927)  ? amiodarone  60 mg/hr (11/11/21 1021)  ? Followed by  ? amiodarone    ? aztreonam    ? dexmedetomidine (PRECEDEX) IV infusion Stopped (11/10/21 0202)  ? feeding supplement (VITAL HIGH PROTEIN) 1,000 mL (11/11/21 0949)  ? heparin 1,200 Units/hr (11/11/21 0941)  ? norepinephrine (LEVOPHED) Adult infusion 2 mcg/min (11/11/21 4982)  ? vancomycin    ? ? ?PRN Meds: ?acetaminophen **OR** acetaminophen (TYLENOL) oral liquid 160 mg/5 mL **OR** acetaminophen, fentaNYL (SUBLIMAZE) injection, midazolam ? ?Physical Exam ?Pulmonary:  ?   Effort: Pulmonary effort is normal.  ?Neurological:  ?   Mental Status: She is alert.  ?         ? ?Vital Signs: BP (!) 114/30   Pulse (!) 112   Temp 97.9 ?F (36.6 ?C)   Resp (!) 23   Ht 5' 7.99" (1.727 m)   Wt 106.3 kg   SpO2 98%   BMI 35.64 kg/m?  ?SpO2: SpO2: 98 % ?O2 Device: O2 Device: Ventilator ?O2 Flow Rate:   ? ?Intake/output summary:  ?Intake/Output Summary (Last 24 hours) at 11/11/2021 1056 ?Last data filed at 11/11/2021 0900 ?Gross per 24 hour  ?Intake 867 ml  ?Output 3145 ml  ?Net -2278 ml  ? ?LBM: Last BM Date : 11/05/21 ?Baseline Weight: Weight: 99.8 kg ?Most recent weight: Weight: 106.3 kg ? ? ?Patient Active Problem List  ? Diagnosis Date Noted  ? 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 Weakness 11/04/2021  ? Type 2 diabetes mellitus with hyperlipidemia ([place]) 11/04/2021  ? Obesity (BMI 30-39.9) 11/04/2021  ? Subacute neurologic deficit 11/04/2021  ? CKD (chronic kidney disease) stage 4, GFR 15-29 ml/min (HCC)   ? History of TIA  08/2019(transient ischemic attack)   ? Fall at home,  initial encounter 02/10/2021  ? ? ?Palliative Care Assessment & Plan  ? ? ? ?Recommendations/Plan: ?Would not consult ENT for trach yet. Family continuing to determine care moving forward.  ? ?Code Status: ? ?  ?Code Status Orders  ?(From admission, onward)  ?  ? ? ?  ? ?  Start     Ordered  ? 10/30/2021 1951  Full code  Continuous       ? 10/15/2021 1951  ? ?  ?  ? ?  ? ?Code Status History   ? ? Date Active Date Inactive Code Status Order ID Comments User Context  ? 02/10/2021 1307 02/12/2021 2225 Full Code [id number]  Collier Bullock, MD ED  ? ?  ? ? ?Prognosis: ? Poor overall ? ? ? ?Care plan was discussed with CCM ? ?Thank you for allowing the Palliative Medicine Team to assist in the care of this patient. ?Asencion Gowda, NP ? ?Please contact Palliative Medicine Team phone at [phone] for questions and concerns.  ? ? ? ? ? ?

## 2021-11-11 NOTE — TOC Progression Note (Signed)
Transition of Care (TOC) - Progression Note  ? ? ?Patient Details  ?Name: Amber Fields ?MRN: [id number] ?Date of Birth: 1932-09-08 ? ?Transition of Care (TOC) CM/SW Contact  ?Shelbie Hutching, RN ?Phone Number: ?11/11/2021, 12:47 PM ? ?Clinical Narrative:    ?Patient remains intubated and sedated.  Family is at bedside.  Palliative care is following.   ? ? ?Expected Discharge Plan: [place] ?Barriers to Discharge: Continued Medical Work up ? ?Expected Discharge Plan and Services ?Expected Discharge Plan: [place] ?  ?Discharge Planning Services: CM Consult ?  ?Living arrangements for the past 2 months: [place] ?                ?DME Arranged: N/A ?DME Agency: NA ?  ?  ?  ?  ?  ?  ?  ?  ? ? ?Social Determinants of Health (SDOH) Interventions ?  ? ?Readmission Risk Interventions ?   ? View : No data to display.  ?  ?  ?  ? ? ?

## 2021-11-11 NOTE — Consult Note (Signed)
Pharmacy Antibiotic Note ? [name] is a 88 y.o. 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 Patient is currently intubated, sedated, and on mechanical ventilation in the ICU since 3/31. Pharmacy has been consulted for vancomycin and aztreonam dosing. ? ?Patient has a penicillin allergy (tongue & facial swelling). No documented history of cephalosporin use. ? ?Stable CKD, BUN trending up ? ?4/5 CXR: Increased mild bilateral heterogeneous opacities, possibly due to worsening atelectasis or mild pulmonary edema ? ?Low grade fever overnight 4/5 - 4/6. Persistent stable leukocytosis ? ?Plan: ? ?Aztreonam 2 g IV q8h ? ?Vancomycin 2 g IV x 1 ?--Daily Scr per protocol ?--Will follow-up Scr with AM labs and consider initiating maintenance regimen at that time ?--Levels at steady state or as clinically indicated ? ?Height: 5' 7.99" (172.7 cm) ?Weight: 106.3 kg (234 lb 5.6 oz) ?IBW/kg (Calculated) : 63.88 ? ?Temp (24hrs), Avg:99.2 ?F (37.3 ?C), Min:97.9 ?F (36.6 ?C), Max:100.7 ?F (38.2 ?C) ? ?Recent Labs  ?Lab 11/07/21 ?8144 11/08/21 ?8185 11/09/21 ?6314 11/10/21 ?9702 11/11/21 ?6378  ?WBC 22.7* 18.8* 22.2* 20.5* 17.9*  ?CREATININE 1.73* 1.43* 1.30* 1.30* 1.47*  ?  ?Estimated Creatinine Clearance: 35.1 mL/min (A) (by C-G formula based on SCr of 1.47 mg/dL (H)).   ? ?Allergies  ?Allergen Reactions  ? Pravastatin Other (See Comments)  ?  Other reaction(s): Dizziness, Muscle Pain  ? Codeine Other (See Comments)  ?  Other reaction(s): Dizziness  ? Ibuprofen Other (See Comments)  ?  Other reaction(s): Dizziness  ? Penicillins   ?  Other reaction(s): Other (See Comments) ?Tongue and facial swelling ?Other reaction(s): Other (See Comments) ?Tongue and facial swelling ?  ? Sulfa Antibiotics Other (See Comments)  ? Lansoprazole Diarrhea  ? ? ?Antimicrobials this admission: ?Vancomycin 4/6 >>  ?Aztreonam 4/6 >>  ? ?Dose adjustments this  admission: ?N/A ? ?Microbiology results: ?3/31 MRSA PCR: (-) ?4/2 Tracheal aspirate: Normal respiratory flora ?4/6 Tracheal aspirate: pending ? ?Thank you for allowing pharmacy to be a part of this patient?s care. ? ?Amber Fields ?11/11/2021 9:26 AM ? ?

## 2021-11-11 NOTE — Plan of Care (Signed)

## 2021-11-11 NOTE — Progress Notes (Signed)
PHARMACY CONSULT NOTE ? ?Pharmacy Consult for Electrolyte Monitoring and Replacement  ? ?Recent Labs: ?Potassium (mmol/L)  ?Date Value  ?11/11/2021 4.1  ?08/09/2013 3.8  ? ?Magnesium (mg/dL)  ?Date Value  ?11/11/2021 2.0  ? ?Calcium (mg/dL)  ?Date Value  ?11/11/2021 8.9  ? ?Calcium, Total (mg/dL)  ?Date Value  ?08/09/2013 9.7  ? ?Albumin (g/dL)  ?Date Value  ?10/19/2021 3.5  ?08/09/2013 3.7  ? ?Phosphorus (mg/dL)  ?Date Value  ?11/11/2021 3.5  ? ?Sodium (mmol/L)  ?Date Value  ?11/11/2021 134 (L)  ?08/09/2013 137  ? ?Assessment: ?70 YOF presenting with weakness. Admitted with Guillain Barre syndrome, and severe respiratory failure on mechanical ventilation. ? ?IVIG 10% 400 mg/kg every 24 hours x 5 days completed 4/4 ? ?Tube feeds 60 mL/hr ?Free water 30 mL q4h ? ?Goal of Therapy:  ?Electrolytes within normal limits ? ?Plan:  ?--No electrolyte replacement warranted at this time. Noted BUN steadily increasing, relatively stable CKD ?--Electrolytes have been stable for several days. Will discontinue electrolyte consult at this time. Defer further ordering of labs and replacement to PCCM ?--Pharmacy will continue to monitor peripherally ? ?Benita Gutter ?11/11/2021 ?8:48 AM ? ?

## 2021-11-11 NOTE — Consult Note (Signed)
ANTICOAGULATION CONSULT NOTE ? ?Pharmacy Consult for IV Heparin ?Indication: atrial fibrillation ? ?Patient Measurements: ?Height: 5' 7.99" (172.7 cm) ?Weight: 106.3 kg (234 lb 5.6 oz) ?IBW/kg (Calculated) : 63.88 ?Heparin Dosing Weight: 88 kg ? ?Labs: ?Recent Labs  ?  11/09/21 ?0433 11/10/21 ?0434 11/11/21 ?0702  ?HGB 10.4* 9.4* 9.8*  ?HCT 31.7* 29.1* 30.5*  ?PLT 367 378 441*  ?CREATININE 1.30* 1.30* 1.47*  ? ? ?Estimated Creatinine Clearance: 35.1 mL/min (A) (by C-G formula based on SCr of 1.47 mg/dL (H)). ? ? ?Medical History: ?Past Medical History:  ?Diagnosis Date  ? Cancer [facility])   ? skin ca  ? Chronic kidney disease   ? Diabetes mellitus without complication ([place])   ? Hyperparathyroidism ([place])   ? Hypertension   ? Transient cerebral ischemia   ? ? ?Medications:  ?No anticoagulation prior to admission per my chart review ?Patient has been receiving Lovenox for DVT prophylaxis at 50 mg daily (last dose 4/5 at 2250) ? 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 She is currently admitted to the ICU where she is intubated, sedated, and on mechanical ventilation. Patient now with new-onset Afib. Pharmacy consulted to initiate heparin infusion for Afib. ? ?Baseline aPTT and PT-INR are within normal limits. Baseline CBC notable for mild, stable anemia.   ? ?Goal of Therapy:  ?Heparin level 0.3-0.7 units/ml ?Monitor platelets by anticoagulation protocol: Yes ?  ?Plan:  ?--Heparin 2000 unit IV bolus (1/2 bolus given recent Lovenox administration) followed by continuous infusion at 1200 units/hr ?--HL 8 hours after initiation of infusion ?--Daily CBC per protocol while on IV heparin ? ?Benita Gutter ?11/11/2021,8:32 AM ? ? ?

## 2021-11-11 NOTE — Progress Notes (Addendum)
? ?NAME:  Amber Fields, MRN:  [id number], DOB:  20-Jul-1933, LOS: 7 ?ADMISSION DATE:  10/23/2021, CONSULTATION DATE:  11/05/2021 ?REFERRING MD:  Dr. Leslye Peer, CHIEF COMPLAINT:  Upper extremity weakness, dysarthria/dysphagia  ? ?Brief Pt Description / Synopsis:  ?88 y.o. 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  Transferred to [facility] 3/31 with PCCM consulted to follow respiratory status and ability to protect her airway ?Progressive rep failure and intubated 4/1 ? ?History of Present Illness/SYNOPSIS  ? 88 year old female with a past medical history as listed below who presented to [facility] ED on 10/11/2021 due to complaints of abrupt onset of bilateral upper extremity weakness. ? ?She was in her normal state of health until 2:30 AM on Monday evening, where she noticed bilateral upper extremity weakness, along with dysarthria.  Both of these have progressively worsened. ? ?Of note she was seen at [facility] on 3/28 and had workup including MRI and MRA of the brain which showed no acute findings, thus was discharged home. ? ?She was admitted by the hospitalist with Neurology consultation.  Concern for cervical-pharyngeal-brachial variant of guillian-barre syndrome. ? ?On 3/31, she was noted to have increased spitting of oral secretions, and failed her swallow evaluation.  NIF was 14.  She was transferred to [facility] with PCCM consultation for close monitoring of respiratory status.  ? ? ?Pertinent  Medical History  ?Chronic kidney disease Stage 4 ?Diabetes mellitus ?Hyperparathyroidism ?Hypertension ?TIA ?MRI BRAIN WNL 3/30 ?MRI CERVICAL SPINE 3/30 ?C3-C4 moderate spinal canal stenosis with moderate right and mild ?left neural foraminal narrowing. ?C6-C7 moderate spinal canal stenosis with mild right neural ?foraminal narrowing. ?C5-C6 mild spinal canal stenosis and mild right neural foraminal ?narrowing. ? C2-C3 mild spinal canal stenosis, without neural  foraminal ?narrowing. ?C4-C5 moderate to severe left neural foraminal narrowing. ? ?Micro Data:  ?3/30: SARS-CoV-2 & Influenza PCR>>negative ?3/31: MRSA PCR>>negative ?4/2: Tracheal aspirate>> normal respiratory flora ?4/6: Tracheal aspirate>> ? ?Antimicrobials:  ?Aztreonam 4/6>> ?Vancomycin 4/6>> ? ?Significant Hospital Events: ?Including procedures, antibiotic start and stop dates in addition to other pertinent events   ?3/29: Presented to ED in evening.  Admitted by Hospitalist.  Neurology consulted ?3/30: Seen by Neurology, concern for Guillain-barre syndrome given clinical presentation. ?3/31:  Pt with continued dysarthria/dysphagia.  Spitting out saliva, failed bedside swallow evaluation.  Transferred to Stepdown with PCCM consultation to closely follow respiratory status ?4/1 patient emergently intubated ?4/2 remains intubated and sedated ?4/4: Palliative Care consulted to assist with goals of care and whether to pursue tracheostomy ?4/5: Plan for diuresis.  Allowing family more time for decision regarding Tracheostomy. ?4/6: Start Heparin for new onset A.fib, check thyroid panel.  Repeating Tracheal aspirate due to change in character or ETT secretions and low grade fever overnight, start empiric Aztreonam and Vancomycin. ? ?Interim History / Subjective:  ?-Overnight with low grade fever (T max 100.7), required PRN fentanyl pushes for pain/discomfort ?-Noted to have small/moderate amount of thick tan secretions from ETT, WBC trending down to 17.9 K (20.5) ~ will repeat tracheal aspirate and check Procalcitonin ~ start empiric Aztreonam and Vancomycin ?-This am afebrile, hemodynamically stable, no vasopressors, tolerating PSV 5/5, 30% FiO2 ?-Sustaining A-fib on telemetry since 19:00 last night  ~ will start Heparin gtt for anticoagulation (CHA2DS2-VASc 2 score 7, HAS-BLED score 4) ?-Renal function unchanged, Creatinine 1.3 (1.3),  ?-Diuresed well yesterday with 40 mg Lasix, UOP 3 L last 24 hrs (net + 4.5 L  since admit) ~ now BP becoming soft, start vasopressors if needed to maintain  MAP >65 ?-Ongoing discussions with family regarding whether to pursue Tracheostomy ? ? ?Objective   ?Blood pressure (!) 120/50, pulse 86, temperature 98.5 ?F (36.9 ?C), temperature source Axillary, resp. rate 14, height 5' 7.99" (1.727 m), weight 106.3 kg, SpO2 100 %. ?   ?Vent Mode: PSV ?FiO2 (%):  [30 %] 30 % ?PEEP:  [5 cmH20] 5 cmH20 ?Pressure Support:  [5 cmH20] 5 cmH20  ? ?Intake/Output Summary (Last 24 hours) at 11/11/2021 0735 ?Last data filed at 11/11/2021 4765 ?Gross per 24 hour  ?Intake 867 ml  ?Output 3210 ml  ?Net -2343 ml  ? ? ?Filed Weights  ? 11/09/21 0443 11/10/21 0500 11/11/21 0500  ?Weight: 103 kg 106.3 kg 106.3 kg  ? ? ?REVIEW OF SYSTEMS ?Unable to obtain, pt is intubated ? ? ?PHYSICAL EXAMINATION: ? ?GENERAL:Acutely ill appearing female, laying in bed, intubated, in NAD ?MOUTH: MMM, orally intubated ?NECK: Supple.  ?PULMONARY: Coarse breath sounds bilaterally, even, nonlabored, in PS mode on vent ?CARDIOVASCULAR: Irregularly irregular rhythm,  no murmur, rubs, or gallops  ?GASTROINTESTINAL: Soft, nontender, -distended. Positive bowel sounds.  ?MUSCULOSKELETAL:   ?NEUROLOGIC: awake, tracks to voice, not following commands this morning, but able to spontaneous move RUE & bilateral LE's ?SKIN:intact,warm,dry ? ? ? ?Assessment & Plan:  ? ?Acute hypoxic, hypercapnic respiratory failure, Due to variant of GBS & aspiration pneumonitis  ?Suspected hypercapnic respiratory failure clinically but not detected on ABG (obtained post intubation) ?-Full vent support, implement lung protective strategies ?-Plateau pressures less than 30 cm H20 ?-Wean FiO2 & PEEP as tolerated to maintain O2 sats >92% ?-Follow intermittent Chest X-ray & ABG as needed ?-Spontaneous Breathing Trials when respiratory parameters met and mental status permits ~ recovery on weeks to months timeframe and high likelihood of needing tracheostomy for survival given  age, dysphagia  ?-Implement VAP Bundle ?-Prn Bronchodilators ?-Pulmonary toilet ?-Tracheal aspirate 4/2 with normal respiratory flora ~ will repeat 4/6 due to change in character/color of secretions from ETT ?-Palliative Care following to assist wth GOC and whether to pursue Tracheostomy ? ?Diagnosis of ACUTE GBS  ?Sedation needs in setting of mechanical ventilation ?PMHx: TIA ?-Maintain a RASS goal of 0 to -1 ?-Fentanyl and versed pushes as needed to maintain RASS goal ?-Avoid sedating medications as able ?-Daily wake up assessment ?-Neuro following, appreciate input ?-Completed course of IVIG (4 doses) as per Neuro ? ?New onset Atrial Fibrillation ?PMHx: Hypertension ?-Continuous cardiac monitoring ?-Maintain MAP >65 ?-Vasopressors as needed to maintain MAP goal ?-Echocardiogram 11/04/21: LVEF 46-50%, Grade I Diastolic dysfunction, RV systolic function normal, no significant valve abnormalities/stenosis/regurgitation ?-Check TSH and thyroid panel ?-Start Heparin drip for anticoagulation (CHA2DS2-VASc 2 score 7, HAS-BLED score 4) ? ?Leukocytosis ?Low grade fever with change in character/color of secretions from ETT on 4/6 ?-Monitor fever curve ?-Trend WBC's ?-Follow cultures as above ?-Repeat Tracheal aspirate 4/6 and check Procalcitonin ~ start empiric Aztreonam and Vancomycin for now ? ?CKD Stage IV ?-Monitor I&O's / urinary output ?-Follow BMP ?-Ensure adequate renal perfusion ?-Avoid nephrotoxic agents as able ?-Replace electrolytes as indicated ? ?Diabetes Mellitus ?-CBG's q4h; Target range of 140 to 180 ?-SSI ?-Follow ICU Hypo/Hyperglycemia protocol ? ? ? ? ? ? ? ?Best Practice (right click and "Reselect all SmartList Selections" daily)  ?Diet/type: TF ?DVT prophylaxis: Start Heparin gtt for A-fib ?GI prophylaxis: PEPCID ?Lines: N/A ?Foley: yes and is still needed ?Code Status:  full code -  ? ?This patient is critically ill with acute hypoxic respiratory failure; which, requires frequent high complexity  decision making,  assessment, support, evaluation, and titration of therapies. This was completed through the application of advanced monitoring technologies and extensive interpretation of multiple databases.  ? ? ?Critic

## 2021-11-11 NOTE — Progress Notes (Signed)
Inpatient Diabetes Program Recommendations ? ?AACE/ADA: New Consensus Statement on Inpatient Glycemic Control (2015) ? ?Target Ranges:  Prepandial:   less than 140 mg/dL ?     Peak postprandial:   less than 180 mg/dL (1-2 hours) ?     Critically ill patients:  140 - 180 mg/dL  ? ?Lab Results  ?Component Value Date  ? GLUCAP 233 (H) 11/11/2021  ? HGBA1C 6.9 (H) 11/04/2021  ? ? ?Review of Glycemic Control ? Latest Reference Range & Units 11/09/21 07:44 11/09/21 11:13 11/09/21 15:21 11/09/21 19:41 11/10/21 00:08 11/10/21 04:50 11/10/21 07:36  ?Glucose-Capillary 70 - 99 mg/dL 238 (H) ? ?Novolog 11 units 246 (H) ? ?Novolog 11 units 238 (H) ? ?Novolog 11 units 211 (H) ? ?Novolog 11 units 246 (H) ? ?Novolog 11 units 221 (H) ? ?Novolog 11 units 252 (H) ? ?Novolog 15 units  ? ?Diabetes history: DM 2 ?Outpatient Diabetes medications: Glimepiride 4 mg Daily, Actos 15 mg Daily ?Current orders for Inpatient glycemic control:  ?Novolog 0-20 units Q4 hours ?Novolog 4 units Q4 Tube Feed Coverage ? ?Inpatient Diabetes Program Recommendations:   ? ?Pt consistently receiving Novolog 11 units at each administration time. ? ?-  Increase Novolog Tube Feed Coverage to 8 units Q4 hours. ? ?Thanks, ? ?Tama Headings RN, MSN, BC-ADM ?Inpatient Diabetes Coordinator ?Team Pager [phone] (8a-5p) ? ?

## 2021-11-11 NOTE — Consult Note (Signed)
ANTICOAGULATION CONSULT NOTE ? ?Pharmacy Consult for IV Heparin ?Indication: atrial fibrillation ? ?Patient Measurements: ?Height: 5' 7.99" (172.7 cm) ?Weight: 106.3 kg (234 lb 5.6 oz) ?IBW/kg (Calculated) : 63.88 ?Heparin Dosing Weight: 88 kg ? ?Labs: ?Recent Labs  ?  11/09/21 ?0433 11/10/21 ?0434 11/11/21 ?0702  ?HGB 10.4* 9.4* 9.8*  ?HCT 31.7* 29.1* 30.5*  ?PLT 367 378 441*  ?CREATININE 1.30* 1.30* 1.47*  ? ? ? ?Estimated Creatinine Clearance: 35.1 mL/min (A) (by C-G formula based on SCr of 1.47 mg/dL (H)). ? ? ?Medical History: ?Past Medical History:  ?Diagnosis Date  ? Cancer [facility])   ? skin ca  ? Chronic kidney disease   ? Diabetes mellitus without complication ([place])   ? Hyperparathyroidism ([place])   ? Hypertension   ? Transient cerebral ischemia   ? ? ?Medications:  ?No anticoagulation prior to admission per my chart review ?Patient has been receiving Lovenox for DVT prophylaxis at 50 mg daily (last dose 4/5 at 2250) ? 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 She is currently admitted to the ICU where she is intubated, sedated, and on mechanical ventilation. Patient now with new-onset Afib. Pharmacy consulted to initiate heparin infusion for Afib. ? ?Baseline aPTT and PT-INR are within normal limits. Baseline CBC notable for mild, stable anemia.   ? ?Goal of Therapy:  ?Heparin level 0.3-0.7 units/ml ?Monitor platelets by anticoagulation protocol: Yes ?  ?Plan:  ?Heparin level therapeutic: continue heparin infusion at 1200 units/hr ?Repeat heparin level in 8 hours  ?Daily CBC per protocol while on IV heparin ? ?Dallie Piles ?11/11/2021,4:24 PM ? ? ?

## 2021-11-11 NOTE — Care Plan (Signed)
?  Interdisciplinary Goals of Care Family Meeting ? ? ?Date carried out: 11/11/2021 ? ?Location of the meeting: Bedside ? ?Member's involved: Dr. Tacy Learn, myself, bedside RN Jerene Pitch, pt's family  ? ?Durable Power of Attorney or acting medical decision maker: Patient's Daughter [name], and pt's daughter-in-law   ? ?Discussion: We discussed goals of care for [facility] .   ? ?Discussion included anticipated prolonged recovery from acute Rosalee Kaufman' (weeks to months) with high likelihood of needing tracheostomy for survival, on top of anticipated prolonged rehabilitation due to high likelihood of severe deconditioning associated with this process. Discussed that very likely pt may not get back to previous baseline high level of functioning (especially for her advanced age), and to consider would the patient be ok with this potential new baseline and quality of life. ? ?They are in talks with Palliative Care in making decision as to whether to proceed with Tracheostomy, along with code status.  Will give a few more days for decision before consulting ENT. ? ?Also discussed new developments this morning including: concern for Pneumonia of which we have re-cultured and started empiric ABX, new onset A-fib w/ RVR requiring Amiodarone and Heparin, along with concern for possible developing septic shock which may require addition of vasopressors, central line, and arterial line. ? ?For now they would like to continue current scope of care/aggressive measures and assess response to therapy while they continue to decide on goals of care going forward. ? ?Code status: Full Code ? ?Disposition: Continue current acute care ? ?Time spent for the meeting: 20 minutes ? ? ?Darel Hong, AGACNP-BC ?[facility] ?Prefer epic messenger for cross cover needs ?If after hours, please call E-link ? ?Bradly Bienenstock, NP ? ?11/11/2021, 1:09 PM ? ? ?

## 2021-11-11 NOTE — Plan of Care (Signed)
Discussed with Dr. Posey Pronto at family's request. Will continue to monitor patient, at this time as she does seem to be slightly improving -- certainly not worsening. If she worsens will consider transfer to tertiary care / academic hospital for EMG/NCS to clarify/confirm diagnosis +/- PLEX or other treatment not available at [facility], at this time too soon to tell. Dr. Posey Pronto agrees patient's advanced age and renal comorbidities may make extubation more challenging and increases risk of needing tracheostomy, only time will tell for sure. Additionally we will have limited outpatient EMG capability as [facility] may not currently have EMG tech and Dr. Posey Pronto will be on leave shortly so [facility] will also not have EMG capability until August 2023. Patient will need referral to [facility] or [facility] for outpatient follow-up on discharge for EMG/NCS  ? ?Lesleigh Noe MD-PhD ?[facility] ?[phone]  ? ?No charge plan of care note ? ? ?

## 2021-11-12 ENCOUNTER — Inpatient Hospital Stay: Payer: Medicare Other

## 2021-11-12 DIAGNOSIS — G610 Guillain-Barre syndrome: Secondary | ICD-10-CM | POA: Diagnosis not present

## 2021-11-12 DIAGNOSIS — Z7189 Other specified counseling: Secondary | ICD-10-CM | POA: Diagnosis not present

## 2021-11-12 LAB — CBC
HCT: 29.7 % — ABNORMAL LOW (ref 36.0–46.0)
Hemoglobin: 9.6 g/dL — ABNORMAL LOW (ref 12.0–15.0)
MCH: 29.4 pg (ref 26.0–34.0)
MCHC: 32.3 g/dL (ref 30.0–36.0)
MCV: 90.8 fL (ref 80.0–100.0)
Platelets: 483 10*3/uL — ABNORMAL HIGH (ref 150–400)
RBC: 3.27 MIL/uL — ABNORMAL LOW (ref 3.87–5.11)
RDW: 16.1 % — ABNORMAL HIGH (ref 11.5–15.5)
WBC: 20.2 10*3/uL — ABNORMAL HIGH (ref 4.0–10.5)
nRBC: 0 % (ref 0.0–0.2)

## 2021-11-12 LAB — GLUCOSE, CAPILLARY
Glucose-Capillary: 131 mg/dL — ABNORMAL HIGH (ref 70–99)
Glucose-Capillary: 133 mg/dL — ABNORMAL HIGH (ref 70–99)
Glucose-Capillary: 191 mg/dL — ABNORMAL HIGH (ref 70–99)
Glucose-Capillary: 191 mg/dL — ABNORMAL HIGH (ref 70–99)
Glucose-Capillary: 199 mg/dL — ABNORMAL HIGH (ref 70–99)
Glucose-Capillary: 218 mg/dL — ABNORMAL HIGH (ref 70–99)

## 2021-11-12 LAB — THYROID PANEL WITH TSH
Free Thyroxine Index: 2 (ref 1.2–4.9)
T3 Uptake Ratio: 31 % (ref 24–39)
T4, Total: 6.3 ug/dL (ref 4.5–12.0)
TSH: 2.58 u[IU]/mL (ref 0.450–4.500)

## 2021-11-12 LAB — BASIC METABOLIC PANEL
Anion gap: 9 (ref 5–15)
BUN: 82 mg/dL — ABNORMAL HIGH (ref 8–23)
CO2: 22 mmol/L (ref 22–32)
Calcium: 9.2 mg/dL (ref 8.9–10.3)
Chloride: 104 mmol/L (ref 98–111)
Creatinine, Ser: 1.62 mg/dL — ABNORMAL HIGH (ref 0.44–1.00)
GFR, Estimated: 31 mL/min — ABNORMAL LOW (ref >60)
Glucose, Bld: 226 mg/dL — ABNORMAL HIGH (ref 70–99)
Potassium: 4.2 mmol/L (ref 3.5–5.1)
Sodium: 135 mmol/L (ref 135–145)

## 2021-11-12 LAB — MISC LABCORP TEST (SEND OUT): Labcorp test code: 830721

## 2021-11-12 LAB — HEPARIN LEVEL (UNFRACTIONATED)
Heparin Unfractionated: 0.29 IU/mL — ABNORMAL LOW (ref 0.30–0.70)
Heparin Unfractionated: 0.4 IU/mL (ref 0.30–0.70)
Heparin Unfractionated: 0.41 IU/mL (ref 0.30–0.70)

## 2021-11-12 LAB — PROCALCITONIN: Procalcitonin: 0.57 ng/mL

## 2021-11-12 LAB — BRAIN NATRIURETIC PEPTIDE: B Natriuretic Peptide: 193.8 pg/mL — ABNORMAL HIGH (ref 0.0–100.0)

## 2021-11-12 MED ORDER — INSULIN ASPART 100 UNIT/ML IJ SOLN
10.0000 [IU] | INTRAMUSCULAR | Status: DC
  Administered 2021-11-12 – 2021-11-13 (×4): 10 [IU] via SUBCUTANEOUS
  Filled 2021-11-12 (×4): qty 1

## 2021-11-12 MED ORDER — LEVOFLOXACIN IN D5W 750 MG/150ML IV SOLN
750.0000 mg | INTRAVENOUS | Status: DC
  Administered 2021-11-12 – 2021-11-16 (×3): 750 mg via INTRAVENOUS
  Filled 2021-11-12 (×4): qty 150

## 2021-11-12 MED ORDER — FUROSEMIDE 10 MG/ML IJ SOLN
40.0000 mg | Freq: Once | INTRAMUSCULAR | Status: AC
  Administered 2021-11-12: 40 mg via INTRAVENOUS
  Filled 2021-11-12: qty 4

## 2021-11-12 MED ORDER — MIDAZOLAM HCL 2 MG/2ML IJ SOLN
INTRAMUSCULAR | Status: AC
  Administered 2021-11-12: 1 mg via INTRAVENOUS
  Filled 2021-11-12: qty 2

## 2021-11-12 MED ORDER — MIDAZOLAM HCL 2 MG/2ML IJ SOLN
1.0000 mg | INTRAMUSCULAR | Status: DC | PRN
  Administered 2021-11-12 – 2021-11-13 (×2): 2 mg via INTRAVENOUS
  Filled 2021-11-12 (×2): qty 2

## 2021-11-12 NOTE — Progress Notes (Addendum)
? ?NAME:  Amber Fields, MRN:  [id number], DOB:  07-Dec-1932, LOS: 8 ?ADMISSION DATE:  10/31/2021, CONSULTATION DATE:  11/05/2021 ?REFERRING MD:  Dr. Leslye Peer, CHIEF COMPLAINT:  Upper extremity weakness, dysarthria/dysphagia  ? ?Brief Pt Description / Synopsis:  ?88 y.o. 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  Transferred to [facility] 3/31 with PCCM consulted to follow respiratory status and ability to protect her airway ?Progressive rep failure and intubated 4/1 ? ?History of Present Illness/SYNOPSIS  ? 88 year old female with a past medical history as listed below who presented to [facility] ED on 11/04/2021 due to complaints of abrupt onset of bilateral upper extremity weakness. ? ?She was in her normal state of health until 2:30 AM on Monday evening, where she noticed bilateral upper extremity weakness, along with dysarthria.  Both of these have progressively worsened. ? ?Of note she was seen at [facility] on 3/28 and had workup including MRI and MRA of the brain which showed no acute findings, thus was discharged home. ? ?She was admitted by the hospitalist with Neurology consultation.  Concern for cervical-pharyngeal-brachial variant of guillian-barre syndrome. ? ?On 3/31, she was noted to have increased spitting of oral secretions, and failed her swallow evaluation.  NIF was 14.  She was transferred to [facility] with PCCM consultation for close monitoring of respiratory status.  ? ? ?Pertinent  Medical History  ?Chronic kidney disease Stage 4 ?Diabetes mellitus ?Hyperparathyroidism ?Hypertension ?TIA ? ?Significant Test Results   ?MRI BRAIN 3/30>>revealed no acute intracranial process. No etiology is seen for the patient's dysarthria and weakness ?MRI CERVICAL SPINE 3/30>>C3-C4 moderate spinal canal stenosis with moderate right and mild left neural foraminal narrowing. C6-C7 moderate spinal canal stenosis with mild right neural foraminal narrowing. C5-C6 mild  spinal canal stenosis and mild right neural foraminal narrowing. C2-C3 mild spinal canal stenosis, without neural foraminal narrowing. C4-C5 moderate to severe left neural foraminal narrowing. ? ?Micro Data:  ?3/30: SARS-CoV-2 & Influenza PCR>>negative ?3/31: MRSA PCR>>negative ?4/2: Tracheal aspirate>> normal respiratory flora ?4/6: Tracheal aspirate>> ? ?Antimicrobials:  ?Aztreonam 4/6>>04/7 ?Vancomycin 4/6>> ?Levaquin 4/7>> ? ?Significant Hospital Events: ?Including procedures, antibiotic start and stop dates in addition to other pertinent events   ?3/29: Presented to ED in evening.  Admitted by Hospitalist.  Neurology consulted ?3/30: Seen by Neurology, concern for Guillain-barre syndrome given clinical presentation. ?3/31:  Pt with continued dysarthria/dysphagia.  Spitting out saliva, failed bedside swallow evaluation.  Transferred to Stepdown with PCCM consultation to closely follow respiratory status ?4/1 patient emergently intubated ?4/2 remains intubated and sedated ?4/4: Palliative Care consulted to assist with goals of care and whether to pursue tracheostomy ?4/5: Plan for diuresis.  Allowing family more time for decision regarding Tracheostomy. ?4/6: Start Heparin for new onset A.fib, check thyroid panel.  Repeating Tracheal aspirate due to change in character or ETT secretions and low grade fever overnight, start empiric Aztreonam and Vancomycin. ?4/7: Pt requiring minimal vent settings FiO2 30% and PEEP of 5 will plan for SBT today and diurese with 40 mg iv lasix x1 dose.  Family still discussing goals of treatment.  Discussing if they would want pt reintubated if she fails extubation, and proceed with tracheostomy placement.  Therefore, will hold off on extubation today to allow pts family time to decide ? ?Interim History / Subjective:  ?Pt able to follow commands.  Respiratory secretions decreasing vent settings: FiO2 30% PEEP 5 O2 sats upper 90's.  Pt tolerated 5/5 throughout the day on 04/6.  Will  perform SBT today ? ?  Objective   ?Blood pressure (!) 147/59, pulse 81, temperature (!) 96.6 ?F (35.9 ?C), temperature source Esophageal, resp. rate 17, height 5' 7.99" (1.727 m), weight 106.3 kg, SpO2 99 %. ?   ?Vent Mode: PRVC ?FiO2 (%):  [30 %] 30 % ?Set Rate:  [16 bmp] 16 bmp ?Vt Set:  [450 mL] 450 mL ?PEEP:  [5 cmH20] 5 cmH20 ?Pressure Support:  [5 cmH20] 5 cmH20  ? ?Intake/Output Summary (Last 24 hours) at 11/12/2021 0914 ?Last data filed at 11/12/2021 0908 ?Gross per 24 hour  ?Intake 2998.65 ml  ?Output 1685 ml  ?Net 1313.65 ml  ? ?Filed Weights  ? 11/10/21 0500 11/11/21 0500 11/12/21 0500  ?Weight: 106.3 kg 106.3 kg 106.3 kg  ? ? ?REVIEW OF SYSTEMS ?Unable to obtain, pt is intubated ? ? ?PHYSICAL EXAMINATION: ?GENERAL: Acutely ill appearing elderly female, NAD resting in bed mechanically intubated  ?HEENT: Supple, no JVD, orally intubated  ?PULM: Faint rhonchi throughout, even, non labored  ?CARDIO: Irregularly irregular rhythm,  no murmur, rubs, or gallops, 2+ radial/2+ distal pulses, 2+ BUE/1+BLE  ?GASTRO: +BS x4, soft, obese, non distended  ?MUSC: Normal bulk and tone, moves all extremities  ?NEUROLOGIC: Awake, follows commands, PERRL ?SKIN: Intact no lesions or rashes present  ? ?Assessment & Plan:  ? ?Acute hypoxic, hypercapnic respiratory failure, Due to variant of GBS & aspiration pneumonitis  ?Suspected hypercapnic respiratory failure clinically but not detected on ABG (obtained post intubation) ?-Full vent support, implement lung protective strategies ?-Plateau pressures less than 30 cm H20 ?-Wean FiO2 & PEEP as tolerated to maintain O2 sats >92% ?-Follow intermittent Chest X-ray & ABG as needed ?-Spontaneous Breathing Trials when respiratory parameters met and mental status permits   ?-Implement VAP Bundle ?-Prn Bronchodilators ?-Pulmonary toilet ?-40 mg IV lasix x1 dose  ?-Palliative Care following to assist wth GOC and whether to pursue Tracheostomy ? ?Diagnosis of ACUTE GBS  ?Sedation needs in  setting of mechanical ventilation ?PMHx: TIA ?-Maintain a RASS goal of 0 to -1 ?-Scheduled oxycodone; fentanyl and versed pushes as needed to maintain RASS goal ?-Avoid sedating medications as able ?-Daily wake up assessment ?-Neuro following, appreciate input ?-Completed course of IVIG (4 doses) as per Neuro ? ?New onset Atrial Fibrillation ?PMHx: Hypertension ?-Continuous cardiac monitoring ?-Maintain MAP >65 ?-Vasopressors as needed to maintain MAP goal ?-Echocardiogram 11/04/21: LVEF 33-82%, Grade I Diastolic dysfunction, RV systolic function normal, no significant valve abnormalities/stenosis/regurgitation ?-TSH and thyroid panel pending ?-Continue amiodarone gtt  ?-Heparin drip for anticoagulation (CHA2DS2-VASc 2 score 7, HAS-BLED score 4) ? ?Leukocytosis ?Low grade fever with change in character/color of secretions from ETT on 4/6 ?-Monitor fever curve ?-Trend WBC's ?-Follow cultures as above ?-Azactam discontinued started levaquin 04/7 and continue vancomycin will deescalate pending tracheal aspirate results  ? ?CKD Stage IV ?-Monitor I&O's / urinary output ?-Follow BMP ?-Ensure adequate renal perfusion ?-Avoid nephrotoxic agents as able ?-Replace electrolytes as indicated ? ?Diabetes Mellitus ?-CBG's q4h; Target range of 140 to 180 ?-SSI and scheduled novolog 8 units q4hrs  ?-Follow ICU Hypo/Hyperglycemia protocol ?-Diabetes coordinator consulted appreciate input  ? ?Best Practice (right click and "Reselect all SmartList Selections" daily)  ?Diet/type: TF ?DVT prophylaxis: Heparin gtt  ?GI prophylaxis: Pepcid  ?Lines: N/A ?Foley: yes and is still needed ?Code Status: Full Code  ? ?Critical Care Time: 40 minutes ? ?Donell Beers, AGNP  ?Pulmonary/Critical Care ?Pager [phone] (please enter 7 digits) ?PCCM Consult Pager [phone] (please enter 7 digits)  ? ? ? ? ?

## 2021-11-12 NOTE — Progress Notes (Signed)
NGT advanced 3 cm back to 60 cm. ?

## 2021-11-12 NOTE — Consult Note (Signed)
ANTICOAGULATION CONSULT NOTE ? ?Pharmacy Consult for IV Heparin ?Indication: atrial fibrillation ? ?Patient Measurements: ?Height: 5' 7.99" (172.7 cm) ?Weight: 106.3 kg (234 lb 5.6 oz) ?IBW/kg (Calculated) : 63.88 ?Heparin Dosing Weight: 88 kg ? ?Labs: ?Recent Labs  ?  11/10/21 ?0434 11/11/21 ?0702 11/11/21 ?1840 11/12/21 ?0445 11/12/21 ?1409 11/12/21 ?2209  ?HGB 9.4* 9.8*  --  9.6*  --   --   ?HCT 29.1* 30.5*  --  29.7*  --   --   ?PLT 378 441*  --  483*  --   --   ?HEPARINUNFRC  --   --    < > 0.29* 0.40 0.41  ?CREATININE 1.30* 1.47*  --  1.62*  --   --   ? < > = values in this interval not displayed.  ? ? ? ?Estimated Creatinine Clearance: 31.8 mL/min (A) (by C-G formula based on SCr of 1.62 mg/dL (H)). ? ? ?Medical History: ?Past Medical History:  ?Diagnosis Date  ? Cancer [facility])   ? skin ca  ? Chronic kidney disease   ? Diabetes mellitus without complication ([place])   ? Hyperparathyroidism ([place])   ? Hypertension   ? Transient cerebral ischemia   ? ? ?Medications:  ?No anticoagulation prior to admission per my chart review ?Patient has been receiving Lovenox for DVT prophylaxis at 50 mg daily (last dose 4/5 at 2250) ? 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 She is currently admitted to the ICU where she is intubated, sedated, and on mechanical ventilation. Patient now with new-onset Afib. Pharmacy consulted to initiate heparin infusion for Afib. ? ?Baseline aPTT and PT-INR are within normal limits. Baseline CBC notable for mild, stable anemia.   ? ?Goal of Therapy:  ?Heparin level 0.3-0.7 units/ml ?Monitor platelets by anticoagulation protocol: Yes ?  ?Plan:  ?--Heparin level is therapeutic x 2 ?--Continue heparin infusion at 1350 units/hr ?--Re-check HL in AM daily while therapeutic ?--Daily CBC per protocol while on IV heparin ? ?Renda Rolls, PharmD, MBA ?11/12/2021 ?11:11 PM ? ? ? ? ?

## 2021-11-12 NOTE — Consult Note (Signed)
ANTICOAGULATION CONSULT NOTE ? ?Pharmacy Consult for IV Heparin ?Indication: atrial fibrillation ? ?Patient Measurements: ?Height: 5' 7.99" (172.7 cm) ?Weight: 106.3 kg (234 lb 5.6 oz) ?IBW/kg (Calculated) : 63.88 ?Heparin Dosing Weight: 88 kg ? ?Labs: ?Recent Labs  ?  11/10/21 ?0434 11/11/21 ?0702 11/11/21 ?1840 11/12/21 ?0445 11/12/21 ?1409  ?HGB 9.4* 9.8*  --  9.6*  --   ?HCT 29.1* 30.5*  --  29.7*  --   ?PLT 378 441*  --  483*  --   ?HEPARINUNFRC  --   --  0.32 0.29* 0.40  ?CREATININE 1.30* 1.47*  --  1.62*  --   ? ? ? ?Estimated Creatinine Clearance: 31.8 mL/min (A) (by C-G formula based on SCr of 1.62 mg/dL (H)). ? ? ?Medical History: ?Past Medical History:  ?Diagnosis Date  ? Cancer [facility])   ? skin ca  ? Chronic kidney disease   ? Diabetes mellitus without complication ([place])   ? Hyperparathyroidism ([place])   ? Hypertension   ? Transient cerebral ischemia   ? ? ?Medications:  ?No anticoagulation prior to admission per my chart review ?Patient has been receiving Lovenox for DVT prophylaxis at 50 mg daily (last dose 4/5 at 2250) ? 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 She is currently admitted to the ICU where she is intubated, sedated, and on mechanical ventilation. Patient now with new-onset Afib. Pharmacy consulted to initiate heparin infusion for Afib. ? ?Baseline aPTT and PT-INR are within normal limits. Baseline CBC notable for mild, stable anemia.   ? ?Goal of Therapy:  ?Heparin level 0.3-0.7 units/ml ?Monitor platelets by anticoagulation protocol: Yes ?  ?Plan:  ?--Heparin level is therapeutic x 1 ?--Continue heparin infusion at 1350 units/hr ?--Re-check confirmatory HL in 8 hours ?--Daily CBC per protocol while on IV heparin ? ?Benita Gutter  ?11/12/2021 ?2:56 PM ? ? ? ?

## 2021-11-12 NOTE — Progress Notes (Signed)
Patient is having increased work of breathing and restless in bed.  Discussed with Rosilyn Mings, NP.  Patient is also having copious secretions from ETT.  Order for Versed given.  Medication given and family updated.   ?

## 2021-11-12 NOTE — Progress Notes (Signed)
NIF -14 ?FVC 1200 ?

## 2021-11-12 NOTE — Plan of Care (Signed)

## 2021-11-12 NOTE — Consult Note (Signed)
ANTICOAGULATION CONSULT NOTE ? ?Pharmacy Consult for IV Heparin ?Indication: atrial fibrillation ? ?Patient Measurements: ?Height: 5' 7.99" (172.7 cm) ?Weight: 106.3 kg (234 lb 5.6 oz) ?IBW/kg (Calculated) : 63.88 ?Heparin Dosing Weight: 88 kg ? ?Labs: ?Recent Labs  ?  11/10/21 ?0434 11/11/21 ?0702 11/11/21 ?1840 11/12/21 ?0445  ?HGB 9.4* 9.8*  --  9.6*  ?HCT 29.1* 30.5*  --  29.7*  ?PLT 378 441*  --  483*  ?HEPARINUNFRC  --   --  0.32 0.29*  ?CREATININE 1.30* 1.47*  --  1.62*  ? ? ? ?Estimated Creatinine Clearance: 31.8 mL/min (A) (by C-G formula based on SCr of 1.62 mg/dL (H)). ? ? ?Medical History: ?Past Medical History:  ?Diagnosis Date  ? Cancer [facility])   ? skin ca  ? Chronic kidney disease   ? Diabetes mellitus without complication ([place])   ? Hyperparathyroidism ([place])   ? Hypertension   ? Transient cerebral ischemia   ? ? ?Medications:  ?No anticoagulation prior to admission per my chart review ?Patient has been receiving Lovenox for DVT prophylaxis at 50 mg daily (last dose 4/5 at 2250) ? 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 She is currently admitted to the ICU where she is intubated, sedated, and on mechanical ventilation. Patient now with new-onset Afib. Pharmacy consulted to initiate heparin infusion for Afib. ? ?Baseline aPTT and PT-INR are within normal limits. Baseline CBC notable for mild, stable anemia.   ? ?Goal of Therapy:  ?Heparin level 0.3-0.7 units/ml ?Monitor platelets by anticoagulation protocol: Yes ?  ?Plan:  ?Heparin level slightly therapeutic: increase heparin infusion to 1350 units/hr ?Repeat HL in 8 hours  ?Daily CBC per protocol while on IV heparin ? ?Renda Rolls, PharmD, MBA ?11/12/2021 ?5:49 AM ? ? ? ?

## 2021-11-12 NOTE — Progress Notes (Signed)
Nutrition Follow Up Note  ? ?DOCUMENTATION CODES:  ? ?Obesity unspecified ? ?INTERVENTION:  ? ?Continue Vital HP to 42m/hr continuous  ? ?Free water flushes 324mq4 hours to maintain tube patency  ? ?Regimen provides 1440kcal/day, 126g/day protein and 138320may of free water  ? ?NUTRITION DIAGNOSIS:  ? ?Inadequate oral intake related to inability to eat (pt sedated and ventilated) as evidenced by NPO status. ? ?GOAL:  ? ?Provide needs based on ASPEN/SCCM guidelines ?-met with tube feeds  ? ?MONITOR:  ? ?Vent status, Labs, Weight trends, TF tolerance, Skin, I & O's ? ?ASSESSMENT:  ? ?[age] female with h/o CKD IV, TIA, DM, HTN, hyperthyroidism, H. pylori, ANA positive and gout who is admitted with suspected Guillain-Barre Syndrome and aspiration requiring intubation and ventilation on 4/1. Pt also noted to have new Afib.  ? ?Pt continues to be sedated and ventilated. OGT in place and pt is tolerating tube feeds at goal rate. Refeed labs stable. Per chart, pt is up ~18lbs since admission; pt +5.5L on her I & Os. Pt is being diuresed today. Palliative care following for GOC.  ? ?Medications reviewed and include: aspirin, colace, pepcid, insulin, oxycodone, miralax, senokot ? ?Labs reviewed: Na 135 wnl, K 4.2 wnl, BUN 82(H), creat 1.62(H), P 3.5 wnl, Mg 2.0 wnl ?Wbc- 20.2(H), Hgb 9.6(L), Hct 29.7(L) ?Cbgs- 131, 191 x 24 hrs ?AIC 6.9(H)- 3/30 ? ?Patient is currently intubated on ventilator support ?MV: 9.8 L/min ?Temp (24hrs), Avg:97.9 ?F (36.6 ?C), Min:96.6 ?F (35.9 ?C), Max:98.8 ?F (37.1 ?C) ? ?Propofol: none  ? ?MAP- >62m14m ? ?UOP- 1820ml37m?Diet Order:   ? ?Diet Order   ? ?       ?  Diet NPO time specified  Diet effective now       ?  ? ?  ?  ? ?  ? ?EDUCATION NEEDS:  ? ?No education needs have been identified at this time ? ?Skin:  Skin Assessment: Reviewed RN Assessment (ecchymosis) ? ?Last BM:  4/7- TYPE 7 ? ?Height:  ? ?Ht Readings from Last 1 Encounters:  ?11/12/21 5' 7.99" (1.727 m)  ? ? ?Weight:  ? ?Wt  Readings from Last 1 Encounters:  ?11/12/21 106.3 kg  ? ? ?Ideal Body Weight:  63.6 kg ? ?BMI:  Body mass index is 35.64 kg/m?. ? ?Estimated Nutritional Needs:  ? ?Kcal:  1135-1445kcal/day ? ?Protein:  >130g/day ? ?Fluid:  1.7-1.9L/day ? ?CaseyKoleen DistanceRD, LDN ?Please refer to AMION for RD and/or RD on-call/weekend/after hours pager ? ?

## 2021-11-12 NOTE — Progress Notes (Signed)
? ?                                                                                                                                                     ?                                                   ?Daily Progress Note  ? ?Patient Name: Amber Fields       Date: 11/12/2021 ?DOB: [birth year]  Age: 88 y.o. MRN#: [id number] ?Attending Physician: Bennie Pierini, MD ?Primary Care Physician: [place] ?Admit Date: 10/27/2021 ? ?Reason for Consultation/Follow-up: Establishing goals of care ? ?Subjective: ?Patient is in bed on vent. She has improved and is awake. She attempts to move her right arm with a small amount of movement. She does not move the left arm. She wiggles left toe barely, none on the right. Daughter and DIL at bedside. They discuss discussion with care team for extubation. Attempted to speak with patient. She appears confused by raising eyebrows. Encouraged family to speak with patient regarding her wishes on one way extubation. Educated them on questions and methods of determining her wishes. Will follow up Monday. Spoke with CCM in depth.  ? ?Length of Stay: 8 ? ?Current Medications: ?Scheduled Meds:  ? aspirin  81 mg Per Tube Daily  ? chlorhexidine gluconate (MEDLINE KIT)  15 mL Mouth Rinse BID  ? Chlorhexidine Gluconate Cloth  6 each Topical Daily  ? colchicine  0.3 mg Per Tube BID  ? docusate  100 mg Per Tube BID  ? famotidine  10 mg Per Tube Daily  ? free water  30 mL Per Tube Q4H  ? insulin aspart  0-20 Units Subcutaneous Q4H  ? insulin aspart  8 Units Subcutaneous Q4H  ? levalbuterol  1.25 mg Nebulization Q6H  ? mouth rinse  15 mL Mouth Rinse 10 times per day  ? oxyCODONE  5 mg Per Tube Q6H  ? polyethylene glycol  17 g Per Tube Daily  ? sennosides  5 mL Per Tube QHS  ? vancomycin variable dose per unstable renal function (pharmacist dosing)   Does not apply See admin instructions  ? ? ?Continuous Infusions: ? sodium chloride Stopped (11/06/21 1344)  ? sodium chloride  Stopped (11/11/21 1954)  ? amiodarone 30 mg/hr (11/12/21 1154)  ? feeding supplement (VITAL HIGH PROTEIN) 1,000 mL (11/11/21 0949)  ? heparin 1,350 Units/hr (11/12/21 1154)  ? levofloxacin (LEVAQUIN) IV 100 mL/hr at 11/12/21 1154  ? ? ?PRN Meds: ?acetaminophen **OR** acetaminophen (TYLENOL) oral liquid 160 mg/5 mL **OR** acetaminophen, fentaNYL (SUBLIMAZE) injection, midazolam ? ?Physical Exam ?Pulmonary:  ?   Comments: On ventilator. ?Neurological:  ?  Mental Status: She is alert.  ?         ? ?Vital Signs: BP (!) 175/85   Pulse (!) 103   Temp (!) 96.6 ?F (35.9 ?C)   Resp (!) 22   Ht 5' 7.99" (1.727 m)   Wt 106.3 kg   SpO2 94%   BMI 35.64 kg/m?  ?SpO2: SpO2: 94 % ?O2 Device: O2 Device: Ventilator ?O2 Flow Rate:   ? ?Intake/output summary:  ?Intake/Output Summary (Last 24 hours) at 11/12/2021 1208 ?Last data filed at 11/12/2021 1154 ?Gross per 24 hour  ?Intake 3220.98 ml  ?Output 1760 ml  ?Net 1460.98 ml  ? ?LBM: Last BM Date : 11/12/21 ?Baseline Weight: Weight: 99.8 kg ? ? ?Patient Active Problem List  ? Diagnosis Date Noted  ? 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 Weakness 11/04/2021  ? Type 2 diabetes mellitus with hyperlipidemia ([place]) 11/04/2021  ? Obesity (BMI 30-39.9) 11/04/2021  ? Subacute neurologic deficit 11/04/2021  ? CKD (chronic kidney disease) stage 4, GFR 15-29 ml/min (H[facility])   ? History of TIA  08/2019(transient ischemic attack)   ? Fall at home, initial encounter 02/10/2021  ? ? ?Palliative Care Assessment & Plan  ? ? ?Recommendations/Plan: ?Patient is showing improvement. She is waking up. Children to talk topatient about 1 way extubation.  ? ?Will follow up Monday.  ? ? ? ?Code Status: ? ?  ?Code Status Orders  ?(From admission, onward)  ?  ? ? ?  ? ?  Start     Ordered  ? 10/20/2021 1951  Full code  Continuous       ? 11/01/2021 1951  ? ?  ?  ? ?  ? ?Code Status History   ? ? Date Active Date Inactive Code Status Order ID Comments User Context  ? 02/10/2021 1307 02/12/2021 2225 Full Code  [id number]  Collier Bullock, MD ED  ? ?  ? ?Care plan was discussed with CCM ? ?Thank you for allowing the Palliative Medicine Team to assist in the care of this patient. ? ? ?Asencion Gowda, NP ? ?Please contact Palliative Medicine Team phone at [phone] for questions and concerns.  ? ? ? ? ? ?

## 2021-11-12 NOTE — Progress Notes (Signed)
Attempted times 3 for each. ?Greatest values: ?NIF  -24 ?VC   566m ?

## 2021-11-13 DIAGNOSIS — G610 Guillain-Barre syndrome: Secondary | ICD-10-CM | POA: Diagnosis not present

## 2021-11-13 LAB — GLUCOSE, CAPILLARY
Glucose-Capillary: 127 mg/dL — ABNORMAL HIGH (ref 70–99)
Glucose-Capillary: 128 mg/dL — ABNORMAL HIGH (ref 70–99)
Glucose-Capillary: 145 mg/dL — ABNORMAL HIGH (ref 70–99)
Glucose-Capillary: 173 mg/dL — ABNORMAL HIGH (ref 70–99)
Glucose-Capillary: 175 mg/dL — ABNORMAL HIGH (ref 70–99)
Glucose-Capillary: 206 mg/dL — ABNORMAL HIGH (ref 70–99)

## 2021-11-13 LAB — CULTURE, RESPIRATORY W GRAM STAIN

## 2021-11-13 LAB — CBC WITH DIFFERENTIAL/PLATELET
Abs Immature Granulocytes: 0.42 10*3/uL — ABNORMAL HIGH (ref 0.00–0.07)
Basophils Absolute: 0.1 10*3/uL (ref 0.0–0.1)
Basophils Relative: 1 %
Eosinophils Absolute: 0.4 10*3/uL (ref 0.0–0.5)
Eosinophils Relative: 3 %
HCT: 27.8 % — ABNORMAL LOW (ref 36.0–46.0)
Hemoglobin: 9.1 g/dL — ABNORMAL LOW (ref 12.0–15.0)
Immature Granulocytes: 3 %
Lymphocytes Relative: 13 %
Lymphs Abs: 2.2 10*3/uL (ref 0.7–4.0)
MCH: 29.4 pg (ref 26.0–34.0)
MCHC: 32.7 g/dL (ref 30.0–36.0)
MCV: 90 fL (ref 80.0–100.0)
Monocytes Absolute: 1.6 10*3/uL — ABNORMAL HIGH (ref 0.1–1.0)
Monocytes Relative: 10 %
Neutro Abs: 11.5 10*3/uL — ABNORMAL HIGH (ref 1.7–7.7)
Neutrophils Relative %: 70 %
Platelets: 524 10*3/uL — ABNORMAL HIGH (ref 150–400)
RBC: 3.09 MIL/uL — ABNORMAL LOW (ref 3.87–5.11)
RDW: 16.8 % — ABNORMAL HIGH (ref 11.5–15.5)
WBC: 16.1 10*3/uL — ABNORMAL HIGH (ref 4.0–10.5)
nRBC: 0 % (ref 0.0–0.2)

## 2021-11-13 LAB — HEPARIN LEVEL (UNFRACTIONATED): Heparin Unfractionated: 0.45 IU/mL (ref 0.30–0.70)

## 2021-11-13 LAB — BASIC METABOLIC PANEL
Anion gap: 9 (ref 5–15)
BUN: 94 mg/dL — ABNORMAL HIGH (ref 8–23)
CO2: 23 mmol/L (ref 22–32)
Calcium: 9.4 mg/dL (ref 8.9–10.3)
Chloride: 102 mmol/L (ref 98–111)
Creatinine, Ser: 1.76 mg/dL — ABNORMAL HIGH (ref 0.44–1.00)
GFR, Estimated: 28 mL/min — ABNORMAL LOW (ref >60)
Glucose, Bld: 163 mg/dL — ABNORMAL HIGH (ref 70–99)
Potassium: 4.2 mmol/L (ref 3.5–5.1)
Sodium: 134 mmol/L — ABNORMAL LOW (ref 135–145)

## 2021-11-13 LAB — MAGNESIUM: Magnesium: 2.2 mg/dL (ref 1.7–2.4)

## 2021-11-13 LAB — PROCALCITONIN: Procalcitonin: 0.49 ng/mL

## 2021-11-13 LAB — PHOSPHORUS: Phosphorus: 4 mg/dL (ref 2.5–4.6)

## 2021-11-13 MED ORDER — DEXTROSE 10 % IV SOLN: INTRAVENOUS | Status: DC

## 2021-11-13 MED ORDER — FREE WATER
100.0000 mL | Status: DC
Start: 2021-11-13 — End: 2021-11-13

## 2021-11-13 MED ORDER — FUROSEMIDE 10 MG/ML IJ SOLN
80.0000 mg | Freq: Once | INTRAMUSCULAR | Status: AC
  Administered 2021-11-13: 80 mg via INTRAVENOUS
  Filled 2021-11-13: qty 8

## 2021-11-13 MED ORDER — GLYCOPYRROLATE 0.2 MG/ML IJ SOLN
0.2000 mg | Freq: Once | INTRAMUSCULAR | Status: AC
  Administered 2021-11-13: 0.2 mg via INTRAVENOUS
  Filled 2021-11-13: qty 1

## 2021-11-13 NOTE — Progress Notes (Signed)
? ?NAME:  Amber Fields, MRN:  [id number], DOB:  1932/10/31, LOS: 9 ?ADMISSION DATE:  10/14/2021, CONSULTATION DATE:  11/05/2021 ?REFERRING MD:  Dr. Leslye Peer, CHIEF COMPLAINT:  Upper extremity weakness, dysarthria/dysphagia  ? ?Brief Pt Description / Synopsis:  ?88 y.o. 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  Transferred to [facility] 3/31 with PCCM consulted to follow respiratory status and ability to protect her airway ?Progressive rep failure and intubated 4/1 ? ?History of Present Illness/SYNOPSIS  ?88 y.o. female with a past medical history as listed below who presented to [facility] ED on 11/02/2021 due to complaints of abrupt onset of bilateral upper extremity weakness. ? ?She was in her normal state of health until 2:30 AM on Monday evening, where she noticed bilateral upper extremity weakness, along with dysarthria.  Both of these have progressively worsened. ? ?Of note she was seen at [facility] on 3/28 and had workup including MRI and MRA of the brain which showed no acute findings, thus was discharged home. ? ?She was admitted by the hospitalist with Neurology consultation.  Concern for cervical-pharyngeal-brachial variant of guillian-barre syndrome. ? ?On 3/31, she was noted to have increased spitting of oral secretions, and failed her swallow evaluation.  NIF was 14.  She was transferred to [facility] with PCCM consultation for close monitoring of respiratory status.  ? ? ?Pertinent  Medical History  ?Chronic kidney disease Stage 4 ?Diabetes mellitus ?Hyperparathyroidism ?Hypertension ?TIA ? ?Significant Test Results   ?MRI BRAIN 3/30>>revealed no acute intracranial process. No etiology is seen for the patient's dysarthria and weakness ?MRI CERVICAL SPINE 3/30>>C3-C4 moderate spinal canal stenosis with moderate right and mild left neural foraminal narrowing. C6-C7 moderate spinal canal stenosis with mild right neural foraminal narrowing. C5-C6 mild spinal  canal stenosis and mild right neural foraminal narrowing. C2-C3 mild spinal canal stenosis, without neural foraminal narrowing. C4-C5 moderate to severe left neural foraminal narrowing. ? ?Micro Data:  ?3/30: SARS-CoV-2 & Influenza PCR>>negative ?3/31: MRSA PCR>>negative ?4/2: Tracheal aspirate>> normal respiratory flora ?4/6: Tracheal aspirate>>H flu ? ?Antimicrobials:  ?Aztreonam 4/6>>04/7 ?Vancomycin 4/6>>4/7 ?Levaquin 4/7>> ? ?Significant Hospital Events: ?Including procedures, antibiotic start and stop dates in addition to other pertinent events   ?3/29: Presented to ED in evening.  Admitted by Hospitalist.  Neurology consulted ?3/30: Seen by Neurology, concern for Guillain-barre syndrome given clinical presentation. ?3/31:  Pt with continued dysarthria/dysphagia.  Spitting out saliva, failed bedside swallow evaluation.  Transferred to Stepdown with PCCM consultation to closely follow respiratory status ?4/1 patient emergently intubated ?4/2 remains intubated and sedated ?4/4: Palliative Care consulted to assist with goals of care and whether to pursue tracheostomy ?4/5: Plan for diuresis.  Allowing family more time for decision regarding Tracheostomy. ?4/6: Start Heparin for new onset A.fib, check thyroid panel.  Repeating Tracheal aspirate due to change in character or ETT secretions and low grade fever overnight, start empiric Aztreonam and Vancomycin. ?4/7: Pt requiring minimal vent settings FiO2 30% and PEEP of 5 will plan for SBT today and diurese with 40 mg iv lasix x1 dose.  Family still discussing goals of treatment.  Discussing if they would want pt reintubated if she fails extubation, and proceed with tracheostomy placement.  Therefore, will hold off on extubation today to allow pts family time to decide ?4/8: plan for extubation today; will discuss reintubation vs DNI status with patient and family post-extubation ? ?Interim History / Subjective:  ?No acute events. Secretions are much improved this  morning. Tracheal aspirate is growing abundant H flu. VC ~  1200 this AM, NIF remains around -20 to -25, however, this is not properly calibrated for ventilated patients. She is awake and following commands. Renal function marginally worsened as compared to yesterday. ? ?After extensive discussion with patient's family yesterday, they are in agreement with plan for extubation followed by discussion with patient regarding goals of care. ? ?Objective   ?Blood pressure (!) 118/51, pulse 80, temperature (!) 97.3 ?F (36.3 ?C), temperature source Axillary, resp. rate (!) 9, height 5' 7.99" (1.727 m), weight 106.8 kg, SpO2 100 %. ?   ?Vent Mode: PRVC ?FiO2 (%):  [30 %] 30 % ?Set Rate:  [16 bmp] 16 bmp ?Vt Set:  [450 mL] 450 mL ?PEEP:  [5 cmH20] 5 cmH20 ?Plateau Pressure:  [18 cmH20] 18 cmH20  ? ?Intake/Output Summary (Last 24 hours) at 11/13/2021 0837 ?Last data filed at 11/13/2021 0816 ?Gross per 24 hour  ?Intake 2579.52 ml  ?Output 2050 ml  ?Net 529.52 ml  ? ?Filed Weights  ? 11/11/21 0500 11/12/21 0500 11/13/21 0441  ?Weight: 106.3 kg 106.3 kg 106.8 kg  ? ? ?REVIEW OF SYSTEMS ?Unable to obtain, pt is intubated ? ? ?PHYSICAL EXAMINATION: ?GENERAL: Elderly, Caucasian female in NAD ?HEENT: Supple, no JVD, orally intubated  ?PULM: CTA b/l, no W/C/R ?CARDIO: RRR,  no murmur, rubs, or gallops  ?GASTRO: +BS x4, soft, obese, non distended  ?MUSC: moves all extremities but clearly weak throughout, no C/C/E  ?NEUROLOGIC: Awake, follows commands, PERRL ?SKIN: Intact no lesions or rashes present  ? ?Assessment & Plan:  ? ?Acute hypoxic, hypercapnic respiratory failure, Due to variant of GBS & aspiration pneumonitis  ?Sedation needs in setting of mechanical ventilation ?-Full vent support, implement lung protective strategies ?-Plateau pressures less than 30 cm H20 ?-Wean FiO2 & PEEP as tolerated to maintain O2 sats >/=92% ?-Follow intermittent Chest X-ray & ABG as needed ?-SAT/SBT; minimize sedation ?-Scheduled oxycodone; fentanyl PRN to  maintain RASS goal ?-Implement VAP Bundle ?-Prn Bronchodilators ?-Pulmonary toilet ?-Plan to extubate to BiPAP today; she will remain Full Code at the time of extubation ? ?Diagnosis of ACUTE GBS  ?PMHx: TIA ?-Avoid sedating medications as able ?-Daily wake up assessment ?-Neuro following, appreciate input ?-Completed course of IVIG (4 doses) as per Neuro ? ?New onset Atrial Fibrillation ?PMHx: Hypertension ?-Echocardiogram 11/04/21: LVEF 28-31%, Grade I Diastolic dysfunction, RV systolic function normal, no significant valve abnormalities/stenosis/regurgitation ?-Continuous cardiac monitoring ?-Maintain MAP >65 ?-Continue amiodarone gtt for now ?-Heparin drip for anticoagulation (CHA2DS2-VASc 2 score 7, HAS-BLED score 4) ? ?H flu HAP ?-Continue Levaquin x7d ?-Monitor fever curve ?-Trend WBC's ?-Follow culture sensivities ? ?CKD Stage IV ?-Monitor I&O's / urinary output ?-Follow BMP ?-Ensure adequate renal perfusion ?-Avoid nephrotoxic agents as able ?-Replace electrolytes as indicated ? ?Diabetes Mellitus ?-CBG's q4h; Target range of 140 to 180 ?-SSI and scheduled novolog 8 units q4hrs  ?-Follow ICU Hypo/Hyperglycemia protocol ?-Diabetes coordinator consulted appreciate input  ? ?Gout, resolved ?-Stop colchicine in setting of AKI on CKD ?-Will closely monitor ? ?Best Practice (right click and "Reselect all SmartList Selections" daily)  ?Diet/type: TF ?DVT prophylaxis: Heparin gtt  ?GI prophylaxis: Pepcid  ?Lines: N/A ?Foley: yes and is still needed ?Code Status: Full Code  ? ?Critical Care Time: 45 minutes ? ?Bennie Pierini, MD 11/13/21 8:37 AM   ?Pulmonary/Critical Care ? ?

## 2021-11-13 NOTE — Progress Notes (Signed)
Attempted times 3 ?Best values ?NIF  -19 cmH2O ?VC    1200 ml ?

## 2021-11-13 NOTE — Progress Notes (Signed)
Patient extubated to Bipap per md order. Bipap 10/5 and 30%  ?

## 2021-11-13 NOTE — Plan of Care (Signed)

## 2021-11-13 NOTE — Consult Note (Signed)
ANTICOAGULATION CONSULT NOTE ? ?Pharmacy Consult for IV Heparin ?Indication: atrial fibrillation ? ?Patient Measurements: ?Height: 5' 7.99" (172.7 cm) ?Weight: 106.8 kg (235 lb 7.2 oz) ?IBW/kg (Calculated) : 63.88 ?Heparin Dosing Weight: 88 kg ? ?Labs: ?Recent Labs  ?  11/11/21 ?0702 11/11/21 ?1840 11/12/21 ?0445 11/12/21 ?1409 11/12/21 ?2209 11/13/21 ?9678  ?HGB 9.8*  --  9.6*  --   --  9.1*  ?HCT 30.5*  --  29.7*  --   --  27.8*  ?PLT 441*  --  483*  --   --  524*  ?HEPARINUNFRC  --    < > 0.29* 0.40 0.41 0.45  ?CREATININE 1.47*  --  1.62*  --   --  1.76*  ? < > = values in this interval not displayed.  ? ? ? ?Estimated Creatinine Clearance: 29.4 mL/min (A) (by C-G formula based on SCr of 1.76 mg/dL (H)). ? ? ?Medical History: ?Past Medical History:  ?Diagnosis Date  ? Cancer [facility])   ? skin ca  ? Chronic kidney disease   ? Diabetes mellitus without complication ([place])   ? Hyperparathyroidism ([place])   ? Hypertension   ? Transient cerebral ischemia   ? ? ?Medications:  ?No anticoagulation prior to admission per my chart review ?Patient has been receiving Lovenox for DVT prophylaxis at 50 mg daily (last dose 4/5 at 2250) ? 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 She is currently admitted to the ICU where she is intubated, sedated, and on mechanical ventilation. Patient now with new-onset Afib. Pharmacy consulted to initiate heparin infusion for Afib. ? ?Baseline aPTT and PT-INR are within normal limits. Baseline CBC notable for mild, stable anemia.   ? ?4/8 0733 HL=0.45     therapeutic ? ?Goal of Therapy:  ?Heparin level 0.3-0.7 units/ml ?Monitor platelets by anticoagulation protocol: Yes ?  ?Plan:  ?--Heparin level is therapeutic x 3 ?--Continue heparin infusion at 1350 units/hr ?--Re-check HL in AM daily while therapeutic ?--Daily CBC per protocol while on IV heparin ? ?Chinita Greenland  PharmD ?Clinical Pharmacist ?11/13/2021 ? ? ? ? ? ?

## 2021-11-13 NOTE — Progress Notes (Signed)
Subjective: ?She has had sedation held.  ? ?Exam: ?Vitals:  ? 11/13/21 0800 11/13/21 0955  ?BP: (!) 118/51   ?Pulse: 80 85  ?Resp: (!) 9 16  ?Temp: (!) 97.3 ?F (36.3 ?C)   ?SpO2: 100% 100%  ? ?Gen: In bed, intubated.  ? ?Neuro: ?MS: opens eyes, follows commands, engages with examiner ?CN: Eyes are conjuagate, EOMI ?Motor: she has proximall >> distal weaknes sof bilateral LE< 0/5 deltoid, 3/5 bicep and triceps. Unclear how good the effort is in her LE, but at least 3/5 ?MGQ:QPYPPJ ? ?Pertinent Labs: ?Cr 1.76 ? ?Impression: 88 year old female with progressive upper extremity weakness and pharyngeal dysfunction, areflexia, and albuminocytological dissociation, most consistent with pharyngeal cervical brachial variant of Guillain-Barr? syndrome, completed 4/4. She does appear better than the last time I examined her on 4/2, but similar to just prior to intubation. Certainly, she doe snot appear markedly worse than just prior to intubation which is reassuring that we have hit a nadir +/- some improvement now.  ? ?Family has been uncertain about tracheostomy, and therefore CCM is planning extubation trial today.  ? ?Her long term prognosis is limited mostly by her age, but I Think there is significant chance that she will continue to recover.  ? ?Recommendations: ?1) Trial extubation.  ?2) continue supportive care.  ? ?Roland Rack, MD ?[facility] ?[phone] ? ?If 7pm- 7am, please page neurology on call as listed in [place]. ? ?

## 2021-11-14 ENCOUNTER — Inpatient Hospital Stay: Payer: Medicare Other

## 2021-11-14 ENCOUNTER — Inpatient Hospital Stay: Payer: Self-pay

## 2021-11-14 DIAGNOSIS — G610 Guillain-Barre syndrome: Secondary | ICD-10-CM | POA: Diagnosis not present

## 2021-11-14 LAB — BLOOD GAS, ARTERIAL
Acid-Base Excess: 4.2 mmol/L — ABNORMAL HIGH (ref 0.0–2.0)
Bicarbonate: 30.3 mmol/L — ABNORMAL HIGH (ref 20.0–28.0)
FIO2: 50 %
MECHVT: 450 mL
O2 Saturation: 99.9 %
PEEP: 5 cmH2O
Patient temperature: 37
RATE: 16 resp/min
pCO2 arterial: 50 mmHg — ABNORMAL HIGH (ref 32–48)
pH, Arterial: 7.39 (ref 7.35–7.45)
pO2, Arterial: 100 mmHg (ref 83–108)

## 2021-11-14 LAB — URINALYSIS, COMPLETE (UACMP) WITH MICROSCOPIC
Bilirubin Urine: NEGATIVE
Glucose, UA: NEGATIVE mg/dL
Hgb urine dipstick: NEGATIVE
Ketones, ur: NEGATIVE mg/dL
Leukocytes,Ua: NEGATIVE
Nitrite: NEGATIVE
Protein, ur: NEGATIVE mg/dL
Specific Gravity, Urine: 1.016 (ref 1.005–1.030)
pH: 5 (ref 5.0–8.0)

## 2021-11-14 LAB — PHOSPHORUS: Phosphorus: 4.6 mg/dL (ref 2.5–4.6)

## 2021-11-14 LAB — BASIC METABOLIC PANEL
Anion gap: 8 (ref 5–15)
BUN: 99 mg/dL — ABNORMAL HIGH (ref 8–23)
CO2: 26 mmol/L (ref 22–32)
Calcium: 9.7 mg/dL (ref 8.9–10.3)
Chloride: 103 mmol/L (ref 98–111)
Creatinine, Ser: 1.82 mg/dL — ABNORMAL HIGH (ref 0.44–1.00)
GFR, Estimated: 27 mL/min — ABNORMAL LOW (ref >60)
Glucose, Bld: 149 mg/dL — ABNORMAL HIGH (ref 70–99)
Potassium: 4.4 mmol/L (ref 3.5–5.1)
Sodium: 137 mmol/L (ref 135–145)

## 2021-11-14 LAB — GLUCOSE, CAPILLARY
Glucose-Capillary: 129 mg/dL — ABNORMAL HIGH (ref 70–99)
Glucose-Capillary: 143 mg/dL — ABNORMAL HIGH (ref 70–99)
Glucose-Capillary: 166 mg/dL — ABNORMAL HIGH (ref 70–99)
Glucose-Capillary: 144 mg/dL — ABNORMAL HIGH (ref 70–99)
Glucose-Capillary: 135 mg/dL — ABNORMAL HIGH (ref 70–99)
Glucose-Capillary: 129 mg/dL — ABNORMAL HIGH (ref 70–99)

## 2021-11-14 LAB — HEPARIN LEVEL (UNFRACTIONATED): Heparin Unfractionated: 0.7 IU/mL (ref 0.30–0.70)

## 2021-11-14 LAB — CBC
HCT: 29 % — ABNORMAL LOW (ref 36.0–46.0)
Hemoglobin: 9.8 g/dL — ABNORMAL LOW (ref 12.0–15.0)
MCH: 29.8 pg (ref 26.0–34.0)
MCHC: 33.8 g/dL (ref 30.0–36.0)
MCV: 88.1 fL (ref 80.0–100.0)
Platelets: 579 10*3/uL — ABNORMAL HIGH (ref 150–400)
RBC: 3.29 MIL/uL — ABNORMAL LOW (ref 3.87–5.11)
RDW: 16.4 % — ABNORMAL HIGH (ref 11.5–15.5)
WBC: 12.1 10*3/uL — ABNORMAL HIGH (ref 4.0–10.5)
nRBC: 0 % (ref 0.0–0.2)

## 2021-11-14 LAB — MAGNESIUM: Magnesium: 2.4 mg/dL (ref 1.7–2.4)

## 2021-11-14 MED ORDER — FENTANYL 2500MCG IN NS 250ML (10MCG/ML) PREMIX INFUSION: 25.0000 ug/h | INTRAVENOUS | Status: DC

## 2021-11-14 MED ORDER — ETOMIDATE 2 MG/ML IV SOLN: 20.0000 mg | Freq: Once | INTRAVENOUS | Status: DC

## 2021-11-14 MED ORDER — VITAL HIGH PROTEIN PO LIQD
1000.0000 mL | ORAL | Status: DC
  Administered 2021-11-14 – 2021-11-15 (×2): 1000 mL

## 2021-11-14 MED ORDER — ROCURONIUM BROMIDE 10 MG/ML (PF) SYRINGE
PREFILLED_SYRINGE | INTRAVENOUS | Status: AC
  Filled 2021-11-14: qty 10

## 2021-11-14 MED ORDER — FENTANYL BOLUS VIA INFUSION
25.0000 ug | INTRAVENOUS | Status: DC | PRN
  Filled 2021-11-14: qty 100

## 2021-11-14 MED ORDER — IPRATROPIUM-ALBUTEROL 0.5-2.5 (3) MG/3ML IN SOLN
3.0000 mL | Freq: Three times a day (TID) | RESPIRATORY_TRACT | Status: DC
  Administered 2021-11-14 – 2021-11-17 (×10): 3 mL via RESPIRATORY_TRACT
  Filled 2021-11-14 (×10): qty 3

## 2021-11-14 MED ORDER — POLYETHYLENE GLYCOL 3350 17 G PO PACK: 17.0000 g | PACK | Freq: Every day | ORAL | Status: DC

## 2021-11-14 MED ORDER — FENTANYL 2500MCG IN NS 250ML (10MCG/ML) PREMIX INFUSION
0.0000 ug/h | INTRAVENOUS | Status: DC
  Administered 2021-11-14: 250 ug/h via INTRAVENOUS

## 2021-11-14 MED ORDER — INSULIN GLARGINE-YFGN 100 UNIT/ML ~~LOC~~ SOLN
5.0000 [IU] | Freq: Every day | SUBCUTANEOUS | Status: DC
  Administered 2021-11-14 – 2021-11-16 (×3): 5 [IU] via SUBCUTANEOUS
  Filled 2021-11-14 (×4): qty 0.05

## 2021-11-14 MED ORDER — FENTANYL 2500MCG IN NS 250ML (10MCG/ML) PREMIX INFUSION
INTRAVENOUS | Status: AC
  Administered 2021-11-14: 25 ug/h via INTRAVENOUS
  Filled 2021-11-14: qty 250

## 2021-11-14 MED ORDER — FAMOTIDINE 40 MG/5ML PO SUSR
20.0000 mg | Freq: Two times a day (BID) | ORAL | Status: DC
  Administered 2021-11-14 (×2): 20 mg
  Filled 2021-11-14 (×5): qty 2.5

## 2021-11-14 MED ORDER — DOCUSATE SODIUM 50 MG/5ML PO LIQD
100.0000 mg | Freq: Two times a day (BID) | ORAL | Status: DC
  Administered 2021-11-14 (×2): 100 mg
  Filled 2021-11-14 (×2): qty 10

## 2021-11-14 MED ORDER — LACTATED RINGERS IV BOLUS
500.0000 mL | Freq: Once | INTRAVENOUS | Status: AC
  Administered 2021-11-14: 500 mL via INTRAVENOUS

## 2021-11-14 MED ORDER — MIDAZOLAM HCL 2 MG/2ML IJ SOLN
2.0000 mg | Freq: Once | INTRAMUSCULAR | Status: AC
  Administered 2021-11-14: 2 mg via INTRAVENOUS

## 2021-11-14 MED ORDER — DOCUSATE SODIUM 50 MG/5ML PO LIQD: 100.0000 mg | Freq: Two times a day (BID) | ORAL | Status: DC

## 2021-11-14 MED ORDER — INSULIN ASPART 100 UNIT/ML IJ SOLN
0.0000 [IU] | INTRAMUSCULAR | Status: DC
  Administered 2021-11-14: 3 [IU] via SUBCUTANEOUS
  Administered 2021-11-14: 4 [IU] via SUBCUTANEOUS
  Administered 2021-11-14 – 2021-11-15 (×3): 3 [IU] via SUBCUTANEOUS
  Administered 2021-11-15: 4 [IU] via SUBCUTANEOUS
  Administered 2021-11-15: 3 [IU] via SUBCUTANEOUS
  Administered 2021-11-15 – 2021-11-16 (×4): 4 [IU] via SUBCUTANEOUS
  Administered 2021-11-16 – 2021-11-17 (×2): 3 [IU] via SUBCUTANEOUS
  Filled 2021-11-14 (×13): qty 1

## 2021-11-14 MED ORDER — POLYETHYLENE GLYCOL 3350 17 G PO PACK
17.0000 g | PACK | Freq: Every day | ORAL | Status: DC
  Administered 2021-11-14: 17 g
  Filled 2021-11-14: qty 1

## 2021-11-14 MED ORDER — FENTANYL CITRATE PF 50 MCG/ML IJ SOSY: 25.0000 ug | PREFILLED_SYRINGE | INTRAMUSCULAR | Status: DC | PRN

## 2021-11-14 MED ORDER — MIDAZOLAM HCL 2 MG/2ML IJ SOLN
INTRAMUSCULAR | Status: AC
  Filled 2021-11-14: qty 2

## 2021-11-14 MED ORDER — ROCURONIUM BROMIDE 50 MG/5ML IV SOLN
80.0000 mg | Freq: Once | INTRAVENOUS | Status: AC
  Administered 2021-11-14: 80 mg via INTRAVENOUS

## 2021-11-14 MED ORDER — GLYCOPYRROLATE 0.2 MG/ML IJ SOLN: 0.2000 mg | INTRAMUSCULAR | Status: DC | PRN

## 2021-11-14 MED ORDER — ETOMIDATE 2 MG/ML IV SOLN
INTRAVENOUS | Status: AC
Start: 2021-11-14 — End: 2021-11-14
  Administered 2021-11-14: 20 mg
  Filled 2021-11-14: qty 20

## 2021-11-14 MED ORDER — FENTANYL CITRATE PF 50 MCG/ML IJ SOSY
50.0000 ug | PREFILLED_SYRINGE | Freq: Once | INTRAMUSCULAR | Status: AC
  Administered 2021-11-14: 50 ug via INTRAVENOUS

## 2021-11-14 NOTE — Consult Note (Signed)
ANTICOAGULATION CONSULT NOTE ? ?Pharmacy Consult for IV Heparin ?Indication: atrial fibrillation ? ?Patient Measurements: ?Height: 5' 7.99" (172.7 cm) ?Weight: 104.4 kg (230 lb 2.6 oz) ?IBW/kg (Calculated) : 63.88 ?Heparin Dosing Weight: 88 kg ? ?Labs: ?Recent Labs  ?  11/12/21 ?0445 11/12/21 ?1409 11/12/21 ?2209 11/13/21 ?5397 11/14/21 ?6734  ?HGB 9.6*  --   --  9.1* 9.8*  ?HCT 29.7*  --   --  27.8* 29.0*  ?PLT 483*  --   --  524* 579*  ?HEPARINUNFRC 0.29*   < > 0.41 0.45 0.70  ?CREATININE 1.62*  --   --  1.76* 1.82*  ? < > = values in this interval not displayed.  ? ? ? ?Estimated Creatinine Clearance: 28.1 mL/min (A) (by C-G formula based on SCr of 1.82 mg/dL (H)). ? ? ?Medical History: ?Past Medical History:  ?Diagnosis Date  ? Cancer [facility])   ? skin ca  ? Chronic kidney disease   ? Diabetes mellitus without complication ([place])   ? Hyperparathyroidism ([place])   ? Hypertension   ? Transient cerebral ischemia   ? ? ?Medications:  ?No anticoagulation prior to admission per my chart review ?Patient has been receiving Lovenox for DVT prophylaxis at 50 mg daily (last dose 4/5 at 2250) ? 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 She is currently admitted to the ICU where she is intubated, sedated, and on mechanical ventilation. Patient now with new-onset Afib. Pharmacy consulted to initiate heparin infusion for Afib. ? ?Baseline aPTT and PT-INR are within normal limits. Baseline CBC notable for mild, stable anemia.   ? ?4/8 0733 HL=0.45     therapeutic ?4/9 0520 HL=0.70  ? ?Goal of Therapy:  ?Heparin level 0.3-0.7 units/ml ?Monitor platelets by anticoagulation protocol: Yes ?  ?Plan:  ?--Heparin level is therapeutic x 4 ?--Continue heparin infusion at 1350 units/hr ?--Re-check HL in AM daily while therapeutic ?--Daily CBC per protocol while on IV heparin ? ?Renda Rolls, PharmD, MBA ?11/14/2021 ?6:52  AM ? ? ? ? ? ? ?

## 2021-11-14 NOTE — Progress Notes (Signed)
? ?NAME:  Amber Fields, MRN:  [id number], DOB:  08-31-1932, LOS: 10 ?ADMISSION DATE:  10/18/2021, CONSULTATION DATE:  11/05/2021 ?REFERRING MD:  Dr. Leslye Peer, CHIEF COMPLAINT:  Upper extremity weakness, dysarthria/dysphagia  ? ?Brief Pt Description / Synopsis:  ?88 y.o. 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  Transferred to [facility] 3/31 with PCCM consulted to follow respiratory status and ability to protect her airway ?Progressive rep failure and intubated 4/1 ? ?History of Present Illness/SYNOPSIS  ?88 y.o. female with a past medical history as listed below who presented to [facility] ED on 10/17/2021 due to complaints of abrupt onset of bilateral upper extremity weakness. ? ?She was in her normal state of health until 2:30 AM on Monday evening, where she noticed bilateral upper extremity weakness, along with dysarthria.  Both of these have progressively worsened. ? ?Of note she was seen at [facility] on 3/28 and had workup including MRI and MRA of the brain which showed no acute findings, thus was discharged home. ? ?She was admitted by the hospitalist with Neurology consultation.  Concern for cervical-pharyngeal-brachial variant of guillian-barre syndrome. ? ?On 3/31, she was noted to have increased spitting of oral secretions, and failed her swallow evaluation.  NIF was 14.  She was transferred to [facility] with PCCM consultation for close monitoring of respiratory status.  ? ? ?Pertinent  Medical History  ?Chronic kidney disease Stage 4 ?Diabetes mellitus ?Hyperparathyroidism ?Hypertension ?TIA ? ?Significant Test Results   ?MRI BRAIN 3/30>>revealed no acute intracranial process. No etiology is seen for the patient's dysarthria and weakness ?MRI CERVICAL SPINE 3/30>>C3-C4 moderate spinal canal stenosis with moderate right and mild left neural foraminal narrowing. C6-C7 moderate spinal canal stenosis with mild right neural foraminal narrowing. C5-C6 mild spinal  canal stenosis and mild right neural foraminal narrowing. C2-C3 mild spinal canal stenosis, without neural foraminal narrowing. C4-C5 moderate to severe left neural foraminal narrowing. ? ?Micro Data:  ?3/30: SARS-CoV-2 & Influenza PCR>>negative ?3/31: MRSA PCR>>negative ?4/2: Tracheal aspirate>> normal respiratory flora ?4/6: Tracheal aspirate>>H flu ? ?Antimicrobials:  ?Aztreonam 4/6>>04/7 ?Vancomycin 4/6>>4/7 ?Levaquin 4/7>> ? ?Significant Hospital Events: ?Including procedures, antibiotic start and stop dates in addition to other pertinent events   ?3/29: Presented to ED in evening.  Admitted by Hospitalist.  Neurology consulted ?3/30: Seen by Neurology, concern for Guillain-barre syndrome given clinical presentation. ?3/31:  Pt with continued dysarthria/dysphagia.  Spitting out saliva, failed bedside swallow evaluation.  Transferred to Stepdown with PCCM consultation to closely follow respiratory status ?4/1 patient emergently intubated ?4/2 remains intubated and sedated ?4/4: Palliative Care consulted to assist with goals of care and whether to pursue tracheostomy ?4/5: Plan for diuresis.  Allowing family more time for decision regarding Tracheostomy. ?4/6: Start Heparin for new onset A.fib, check thyroid panel.  Repeating Tracheal aspirate due to change in character or ETT secretions and low grade fever overnight, start empiric Aztreonam and Vancomycin. ?4/7: Pt requiring minimal vent settings FiO2 30% and PEEP of 5 will plan for SBT today and diurese with 40 mg iv lasix x1 dose.  Family still discussing goals of treatment.  Discussing if they would want pt reintubated if she fails extubation, and proceed with tracheostomy placement.  Therefore, will hold off on extubation today to allow pts family time to decide ?4/8: plan for extubation today; will discuss reintubation vs DNI status with patient and family post-extubation ?4/9: reintubated, unable to manage secretions ? ?Interim History / Subjective:   ?Needed Robinul x1 overnight but was stable on nasal cannula. Unfortunately  decompensated this morning due to inability to manage secretions. Was reintubated with plans for tracheostomy per patient and family wishes. CXR shows RLL opacity consistent with aspiration event. ? ?Objective   ?Blood pressure (!) 171/99, pulse (!) 109, temperature 98.7 ?F (37.1 ?C), temperature source Axillary, resp. rate (!) 21, height 5' 7.99" (1.727 m), weight 104.4 kg, SpO2 95 %. ?   ?FiO2 (%):  [30 %] 30 %  ? ?Intake/Output Summary (Last 24 hours) at 11/14/2021 0920 ?Last data filed at 11/14/2021 0600 ?Gross per 24 hour  ?Intake 1477.82 ml  ?Output 2875 ml  ?Net -1397.18 ml  ? ?Filed Weights  ? 11/12/21 0500 11/13/21 0441 11/14/21 0425  ?Weight: 106.3 kg 106.8 kg 104.4 kg  ? ? ?REVIEW OF SYSTEMS ?Unable to obtain, pt is intubated ? ? ?PHYSICAL EXAMINATION: ?GENERAL: Elderly, Caucasian female in NAD ?HEENT: Supple, no JVD, orally intubated ?PULM: diffuse bilateral crackles and rhonchi ?CARDIO: tachycardic,  no murmur, rubs, or gallops  ?GASTRO: +BS x4, soft, obese, non distended  ?MUSC: moves all extremities but clearly weak throughout, no C/C/E  ?NEUROLOGIC: Awake, follows commands, PERRL ?SKIN: Intact no lesions or rashes present  ? ?Assessment & Plan:  ? ?Acute hypoxic, hypercapnic respiratory failure, Due to variant of GBS & aspiration pneumonitis  ?Sedation needs in setting of mechanical ventilation ?-Full vent support, implement lung protective strategies ?-Plateau pressures less than 30 cm H20 ?-Wean FiO2 & PEEP as tolerated to maintain O2 sats >/=92% ?-Follow intermittent Chest X-ray & ABG as needed ?-Fentanyl to maintain RASS goal of 0/-1 ?-Implement VAP Bundle ?-Scheduled Duo-Nebs ?-Pulmonary toilet ?-Consult ENT for trach ? ?Diagnosis of ACUTE GBS  ?PMHx: TIA ?-Avoid sedating medications as able ?-Daily wake up assessment ?-Neuro following, appreciate input ?-Completed course of IVIG (4 doses) as per Neuro ? ?New onset Atrial  Fibrillation, resolved ?PMHx: Hypertension ?-Echocardiogram 11/04/21: LVEF 76-73%, Grade I Diastolic dysfunction, RV systolic function normal, no significant valve abnormalities/stenosis/regurgitation ?-Continuous cardiac monitoring ?-Heparin drip for anticoagulation (CHA2DS2-VASc 2 score 7, HAS-BLED score 4) ?-Consult for PICC ? ?H flu HAP ?-Continue Levaquin x7d ?-Monitor fever curve ?-Trend WBC's ?-Follow culture sensivities ? ?CKD Stage IV ?-Monitor I&O's / urinary output ?-Follow BMP ?-Ensure adequate renal perfusion ?-Avoid nephrotoxic agents as able ?-Replace electrolytes as indicated ? ?Diabetes Mellitus ?-CBG's q4h; Target range of 140 to 180 ?-SSI and scheduled novolog 8 units q4hrs  ?-Follow ICU Hypo/Hyperglycemia protocol ?-Diabetes coordinator consulted appreciate input  ? ?Gout, resolved ?-Stop colchicine in setting of AKI on CKD ?-Will closely monitor ? ?Best Practice (right click and "Reselect all SmartList Selections" daily)  ?Diet/type: TF ?DVT prophylaxis: Heparin gtt  ?GI prophylaxis: Pepcid  ?Lines: N/A ?Foley: yes and is still needed ?Code Status: Full Code  ? ?Critical Care Time: 45 minutes not including procedure time ? ?Bennie Pierini, MD 11/14/21 9:20 AM   ?Pulmonary/Critical Care ? ?

## 2021-11-14 NOTE — Progress Notes (Signed)
Secure chat sent to Dr Jonnie Finner re PICC order.  Cr Cl of 25 with hx CKD4 and recent follow up outpatient with nephrology.  PICC to be canceled. ?

## 2021-11-14 NOTE — Procedures (Signed)
Intubation Procedure Note ? ?Amber Fields  ?[id number]  ?[birth year] ? ?Date:11/14/21  ?Time:8:41 AM  ? ?Provider Performing:Andrik Sandt Sharene Butters  ? ? ?Procedure: Intubation ([id number]) ? ?Indication(s) ?Respiratory Failure ? ?Consent ?Unable to obtain consent due to emergent nature of procedure. ? ? ?Anesthesia ?Etomidate and Rocuronium ? ? ?Time Out ?Verified patient identification, verified procedure, site/side was marked, verified correct patient position, special equipment/implants available, medications/allergies/relevant history reviewed, required imaging and test results available. ? ? ?Sterile Technique ?Usual hand hygeine, masks, and gloves were used ? ? ?Procedure Description ?Patient positioned in bed supine.  Sedation given as noted above.  Patient was intubated with endotracheal tube using Glidescope.  View was Grade 1 full glottis .  Number of attempts was 1.  Colorimetric CO2 detector was consistent with tracheal placement. ? ? ?Complications/Tolerance ?None; patient tolerated the procedure well. ?Chest X-ray is ordered to verify placement. ? ? ?EBL ?Minimal ? ? ?Specimen(s) ?None ? ?Bennie Pierini, MD 11/14/21 8:42 AM   ?

## 2021-11-14 NOTE — Progress Notes (Signed)
Neurology progress note ? ?Interval events/subjective: ?She was reintubated today due to hypoxia and inability to manage her secretions ?She also had a low-grade fever overnight 100.7, started on empiric aztreonam and vancomycin and receiving fentanyl pushes for pain ?She has continued to be in atrial fibrillation as well and was started on heparin drip ? ?Exam: ?Vitals:  ? 11/14/21 1230 11/14/21 1401  ?BP: (!) 109/56   ?Pulse: 80   ?Resp: 16   ?Temp:    ?SpO2: 98% 96%  ? ?Gen: In bed, intubated.  ? ?Neuro: ?MS: opens eyes, follows commands, engages with examiner, follows some simple commands such as moving her hands and feet ?CN: Eyes are conjuagate, EOMI, pupils equal round and reactive today ?Motor: she has proximall >> distal weaknes of bilateral UE, moving her bilateral hands but not deltoids.  She can wiggle bilateral feet but cannot move her legs antigravity ?HYQ:MVHQIO ? ?Pertinent Labs: ? ?Basic Metabolic Panel: ?Recent Labs  ?Lab 11/08/21 ?9629 11/09/21 ?5284 11/10/21 ?0434 11/11/21 ?1324 11/12/21 ?0445 11/13/21 ?4010 11/14/21 ?2725  ?NA 133*   < > 133* 134* 135 134* 137  ?K 4.1   < > 4.4 4.1 4.2 4.2 4.4  ?CL 107   < > 106 104 104 102 103  ?CO2 21*   < > '22 24 22 23 26  '$ ?GLUCOSE 219*   < > 270* 247* 226* 163* 149*  ?BUN 43*   < > 52* 70* 82* 94* 99*  ?CREATININE 1.43*   < > 1.30* 1.47* 1.62* 1.76* 1.82*  ?CALCIUM 8.5*   < > 8.7* 8.9 9.2 9.4 9.7  ?MG 2.3  --   --  2.0  --  2.2 2.4  ?PHOS 2.9  --   --  3.5  --  4.0 4.6  ? < > = values in this interval not displayed.  ? ? ?CBC: ?Recent Labs  ?Lab 11/08/21 ?3664 11/09/21 ?4034 11/10/21 ?0434 11/11/21 ?7425 11/12/21 ?0445 11/13/21 ?9563 11/14/21 ?8756  ?WBC 18.8* 22.2* 20.5* 17.9* 20.2* 16.1* 12.1*  ?NEUTROABS 15.5* 18.3*  --   --   --  11.5*  --   ?HGB 10.3* 10.4* 9.4* 9.8* 9.6* 9.1* 9.8*  ?HCT 32.8* 31.7* 29.1* 30.5* 29.7* 27.8* 29.0*  ?MCV 92.9 91.6 92.1 91.6 90.8 90.0 88.1  ?PLT 306 367 378 441* 483* 524* 579*  ? ? ?Coagulation Studies: ?No results for  input(s): LABPROT, INR in the last 72 hours.  ? ? 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  She is better than her last examination for me on 4/5, but worse than yesterday and has been reintubated.  It is not clear to me that her reintubation is secondary to worsening neuromuscular dysfunction versus continued inability to manage her secretions ? ?Her long term prognosis remains guarded secondary to her age and comorbidities, though from a neuromuscular perspective there is a chance of recovery ? ?Recommendations: ?-Appreciate CCM management of her possible infection, ventilator support and other comorbidities ?-Neurology will continue to follow ? ?Amber Noe MD-PhD ?[facility] ?[phone]  ?[facility] coverage for [facility] is from 8 AM to 4 AM in-house and 4 PM to 8 PM by telephone/video. 8 PM to 8 AM emergent questions or overnight urgent questions should be addressed to Teleneurology On-call or [facility] neurohospitalist; contact information can be found on AMION ? ?

## 2021-11-14 NOTE — Progress Notes (Signed)
SLP Cancellation Note ? ?Patient Details ?Name: Amber Fields ?MRN: [id number] ?DOB: [birth year] ? ? ?Cancelled treatment:       Reason Eval/Treat Not Completed: Medical issues which prohibited therapy  ? ?SLP consult received and appreciated. Chart review completed. Per chart review, pt emergently intubated this AM. SLP to sign off at this time. Please reconsult when pt able to participate in SLP evaluation. ? ?RN made aware of the above. ? ?Cherrie Gauze, M.S., CCC-SLP ?Speech-Language Pathologist ?[facility] ?[phone] ([place])  ? ?Clearnce Sorrel Jayleigh Notarianni ?11/14/2021, 9:09 AM ?

## 2021-11-15 DIAGNOSIS — G610 Guillain-Barre syndrome: Secondary | ICD-10-CM | POA: Diagnosis not present

## 2021-11-15 DIAGNOSIS — Z7189 Other specified counseling: Secondary | ICD-10-CM | POA: Diagnosis not present

## 2021-11-15 LAB — GLUCOSE, CAPILLARY
Glucose-Capillary: 120 mg/dL — ABNORMAL HIGH (ref 70–99)
Glucose-Capillary: 135 mg/dL — ABNORMAL HIGH (ref 70–99)
Glucose-Capillary: 114 mg/dL — ABNORMAL HIGH (ref 70–99)
Glucose-Capillary: 149 mg/dL — ABNORMAL HIGH (ref 70–99)
Glucose-Capillary: 165 mg/dL — ABNORMAL HIGH (ref 70–99)
Glucose-Capillary: 171 mg/dL — ABNORMAL HIGH (ref 70–99)

## 2021-11-15 LAB — CBC
HCT: 29.4 % — ABNORMAL LOW (ref 36.0–46.0)
Hemoglobin: 9.7 g/dL — ABNORMAL LOW (ref 12.0–15.0)
MCH: 29.9 pg (ref 26.0–34.0)
MCHC: 33 g/dL (ref 30.0–36.0)
MCV: 90.7 fL (ref 80.0–100.0)
Platelets: 576 10*3/uL — ABNORMAL HIGH (ref 150–400)
RBC: 3.24 MIL/uL — ABNORMAL LOW (ref 3.87–5.11)
RDW: 16.6 % — ABNORMAL HIGH (ref 11.5–15.5)
WBC: 11.6 10*3/uL — ABNORMAL HIGH (ref 4.0–10.5)
nRBC: 0 % (ref 0.0–0.2)

## 2021-11-15 LAB — BASIC METABOLIC PANEL
Anion gap: 7 (ref 5–15)
BUN: 93 mg/dL — ABNORMAL HIGH (ref 8–23)
CO2: 27 mmol/L (ref 22–32)
Calcium: 9.5 mg/dL (ref 8.9–10.3)
Chloride: 107 mmol/L (ref 98–111)
Creatinine, Ser: 1.84 mg/dL — ABNORMAL HIGH (ref 0.44–1.00)
GFR, Estimated: 26 mL/min — ABNORMAL LOW (ref >60)
Glucose, Bld: 131 mg/dL — ABNORMAL HIGH (ref 70–99)
Potassium: 4.3 mmol/L (ref 3.5–5.1)
Sodium: 141 mmol/L (ref 135–145)

## 2021-11-15 LAB — PHOSPHORUS: Phosphorus: 3.9 mg/dL (ref 2.5–4.6)

## 2021-11-15 LAB — HEPARIN LEVEL (UNFRACTIONATED): Heparin Unfractionated: 0.91 IU/mL — ABNORMAL HIGH (ref 0.30–0.70)

## 2021-11-15 LAB — MAGNESIUM: Magnesium: 2.5 mg/dL — ABNORMAL HIGH (ref 1.7–2.4)

## 2021-11-15 MED ORDER — METHYLPREDNISOLONE SODIUM SUCC 40 MG IJ SOLR
40.0000 mg | Freq: Every day | INTRAMUSCULAR | Status: DC
  Administered 2021-11-15 – 2021-11-16 (×2): 40 mg via INTRAVENOUS
  Filled 2021-11-15 (×2): qty 1

## 2021-11-15 MED ORDER — GLYCOPYRROLATE 0.2 MG/ML IJ SOLN
0.2000 mg | Freq: Two times a day (BID) | INTRAMUSCULAR | Status: DC
  Administered 2021-11-15 – 2021-11-16 (×3): 0.2 mg via INTRAVENOUS
  Filled 2021-11-15 (×3): qty 1

## 2021-11-15 MED ORDER — HEPARIN SODIUM (PORCINE) 5000 UNIT/ML IJ SOLN
5000.0000 [IU] | Freq: Three times a day (TID) | INTRAMUSCULAR | Status: DC
  Administered 2021-11-15 – 2021-11-17 (×6): 5000 [IU] via SUBCUTANEOUS
  Filled 2021-11-15 (×6): qty 1

## 2021-11-15 MED FILL — Fentanyl Citrate Preservative Free (PF) Inj 2500 MCG/50ML: INTRAMUSCULAR | Qty: 50 | Status: AC

## 2021-11-15 MED FILL — Sodium Chloride IV Soln 0.9%: INTRAVENOUS | Qty: 250 | Status: AC

## 2021-11-15 NOTE — Progress Notes (Signed)
Nutrition Follow Up Note  ? ?DOCUMENTATION CODES:  ? ?Obesity unspecified ? ?INTERVENTION:  ? ?RD will add supplements once pt's diet is advanced  ? ?NUTRITION DIAGNOSIS:  ? ?Inadequate oral intake related to inability to eat (pt sedated and ventilated) as evidenced by NPO status. ? ?GOAL:  ? ?Patient will meet greater than or equal to 90% of their needs ?-previously met with tube feeds  ? ?MONITOR:  ? ?Diet advancement, Labs, Weight trends, Skin, I & O's ? ?ASSESSMENT:  ? ?88 y/o female with h/o CKD IV, TIA, DM, HTN, hyperthyroidism, H. pylori, ANA positive and gout who is admitted with suspected Guillain-Barre Syndrome and aspiration requiring intubation and ventilation on 4/1. Pt also noted to have new Afib.  ? ?Pt extubated 4/8 and re-intubated 4/9. Pt s/p one way extubation today. RD will monitor for diet advancement and add supplements when appropriate. Per chart, pt up ~12lbs since admission. Pt +2.8L on her I & Os. No BM noted since 4/7; pt is receiving bowel regimen. Palliative care following. RD will add supplements once pt's diet is advanced.  ? ?Medications reviewed and include: aspirin, colace, pepcid, heparin, insulin, miralax, senokot ? ?Labs reviewed: K 4.3 wnl, BUN 93(H), creat 1.84(H), P 3.9 wnl, Mg 2.5(H) ?Wbc- 11.6(H), Hgb 9.7(L), Hct 29.4(L) ?Cbgs- 114, 135, 120 x 24hrs  ? ?Diet Order:   ? ?Diet Order   ? ?       ?  Diet NPO time specified  Diet effective now       ?  ? ?  ?  ? ?  ? ?EDUCATION NEEDS:  ? ?No education needs have been identified at this time ? ?Skin:  Skin Assessment: Reviewed RN Assessment (ecchymosis) ? ?Last BM:  4/7- TYPE 7 ? ?Height:  ? ?Ht Readings from Last 1 Encounters:  ?11/12/21 5' 7.99" (1.727 m)  ? ? ?Weight:  ? ?Wt Readings from Last 1 Encounters:  ?11/15/21 103.7 kg  ? ? ?Ideal Body Weight:  63.6 kg ? ?BMI:  Body mass index is 34.77 kg/m?. ? ?Estimated Nutritional Needs:  ? ?Kcal:  1800-2100kcal/day ? ?Protein:  90-105g/day ? ?Fluid:  1.7-1.9L/day ? ?Koleen Distance  MS, RD, LDN ?Please refer to AMION for RD and/or RD on-call/weekend/after hours pager ? ?

## 2021-11-15 NOTE — TOC Progression Note (Signed)
Transition of Care (TOC) - Progression Note  ? ? ?Patient Details  ?Name: Amber Fields ?MRN: [id number] ?Date of Birth: 07/21/1933 ? ?Transition of Care (TOC) CM/SW Contact  ?Shelbie Hutching, RN ?Phone Number: ?11/15/2021, 10:07 AM ? ?Clinical Narrative:    ?Plan for one way extubation today, patient is awake, daughter is at the bedside.  Palliative met with patient and family this morning.   ? ? ?Expected Discharge Plan: [place] ?Barriers to Discharge: Continued Medical Work up ? ?Expected Discharge Plan and Services ?Expected Discharge Plan: [place] ?  ?Discharge Planning Services: CM Consult ?  ?Living arrangements for the past 2 months: [place] ?                ?DME Arranged: N/A ?DME Agency: NA ?  ?  ?  ?  ?  ?  ?  ?  ? ? ?Social Determinants of Health (SDOH) Interventions ?  ? ?Readmission Risk Interventions ?   ? View : No data to display.  ?  ?  ?  ? ? ?

## 2021-11-15 NOTE — Plan of Care (Signed)
?  Problem: Education: Goal: Knowledge of secondary prevention will improve (SELECT ALL) Outcome: Progressing   

## 2021-11-15 NOTE — Progress Notes (Signed)
Patient educated on IS and Flutter valve. Return demonstration completed. At bedside for patient to use q1h.  ?

## 2021-11-15 NOTE — Consult Note (Signed)
ANTICOAGULATION CONSULT NOTE ? ?Pharmacy Consult for IV Heparin ?Indication: atrial fibrillation ? ?Patient Measurements: ?Height: 5' 7.99" (172.7 cm) ?Weight: 103.7 kg (228 lb 9.9 oz) ?IBW/kg (Calculated) : 63.88 ?Heparin Dosing Weight: 88 kg ? ?Labs: ?Recent Labs  ?  11/13/21 ?6803 11/14/21 ?2122 11/15/21 ?0541  ?HGB 9.1* 9.8* 9.7*  ?HCT 27.8* 29.0* 29.4*  ?PLT 524* 579* 576*  ?HEPARINUNFRC 0.45 0.70 0.91*  ?CREATININE 1.76* 1.82* 1.84*  ? ? ? ?Estimated Creatinine Clearance: 27.6 mL/min (A) (by C-G formula based on SCr of 1.84 mg/dL (H)). ? ? ?Medical History: ?Past Medical History:  ?Diagnosis Date  ? Cancer [facility])   ? skin ca  ? Chronic kidney disease   ? Diabetes mellitus without complication ([place])   ? Hyperparathyroidism ([place])   ? Hypertension   ? Transient cerebral ischemia   ? ? ?Medications:  ?No anticoagulation prior to admission per my chart review ?Patient has been receiving Lovenox for DVT prophylaxis at 50 mg daily (last dose 4/5 at 2250) ? 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 She is currently admitted to the ICU where she is intubated, sedated, and on mechanical ventilation. Patient now with new-onset Afib. Pharmacy consulted to initiate heparin infusion for Afib. ? ?Baseline aPTT and PT-INR are within normal limits. Baseline CBC notable for mild, stable anemia. ? ?4/8 0733 HL=0.45     therapeutic ?4/9 0520 HL=0.70     therapeutic ? ?Goal of Therapy:  ?Heparin level 0.3-0.7 units/ml ?Monitor platelets by anticoagulation protocol: Yes ?  ?Plan: Heparin level is supra-therapeutic at 0.91 ?Decrease heparin infusion to 1150 units/hr ?8 hour heparin level ?Daily CBC  ? ? ?Wynelle Cleveland, PharmD ?Pharmacy Resident  ?11/15/2021 ?7:27 AM ? ?

## 2021-11-15 NOTE — Progress Notes (Addendum)
? ?NAME:  Amber Fields, MRN:  [id number], DOB:  April 03, 1933, LOS: 11 ?ADMISSION DATE:  10/25/2021, CONSULTATION DATE:  11/05/2021 ?REFERRING MD:  Dr. Leslye Peer, CHIEF COMPLAINT:  Upper extremity weakness, dysarthria/dysphagia  ? ?Brief Pt Description / Synopsis:  ?88 y.o. 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  Transferred to [facility] 3/31 with PCCM consulted to follow respiratory status and ability to protect her airway ?Progressive rep failure and intubated 4/1 ? ?History of Present Illness/SYNOPSIS  ?88 y.o. female with a past medical history as listed below who presented to [facility] ED on 10/23/2021 due to complaints of abrupt onset of bilateral upper extremity weakness. ? ?She was in her normal state of health until 2:30 AM on Monday evening, where she noticed bilateral upper extremity weakness, along with dysarthria.  Both of these have progressively worsened. ? ?Of note she was seen at [facility] on 3/28 and had workup including MRI and MRA of the brain which showed no acute findings, thus was discharged home. ? ?She was admitted by the hospitalist with Neurology consultation.  Concern for cervical-pharyngeal-brachial variant of guillian-barre syndrome. ? ?On 3/31, she was noted to have increased spitting of oral secretions, and failed her swallow evaluation.  NIF was 14.  She was transferred to [facility] with PCCM consultation for close monitoring of respiratory status.  ? ?11/15/21- patient is for SBT. She is with palliative in conversation for [place].  I met with daughter and we had detailed conversation.  She is s/p extubation doing well thus far.  ? ?Pertinent  Medical History  ?Chronic kidney disease Stage 4 ?Diabetes mellitus ?Hyperparathyroidism ?Hypertension ?TIA ? ?Significant Test Results   ?MRI BRAIN 3/30>>revealed no acute intracranial process. No etiology is seen for the patient's dysarthria and weakness ?MRI CERVICAL SPINE 3/30>>C3-C4 moderate spinal  canal stenosis with moderate right and mild left neural foraminal narrowing. C6-C7 moderate spinal canal stenosis with mild right neural foraminal narrowing. C5-C6 mild spinal canal stenosis and mild right neural foraminal narrowing. C2-C3 mild spinal canal stenosis, without neural foraminal narrowing. C4-C5 moderate to severe left neural foraminal narrowing. ? ?Micro Data:  ?3/30: SARS-CoV-2 & Influenza PCR>>negative ?3/31: MRSA PCR>>negative ?4/2: Tracheal aspirate>> normal respiratory flora ?4/6: Tracheal aspirate>>H flu ? ?Antimicrobials:  ?Aztreonam 4/6>>04/7 ?Vancomycin 4/6>>4/7 ?Levaquin 4/7>> ? ?Significant Hospital Events: ?Including procedures, antibiotic start and stop dates in addition to other pertinent events   ?3/29: Presented to ED in evening.  Admitted by Hospitalist.  Neurology consulted ?3/30: Seen by Neurology, concern for Guillain-barre syndrome given clinical presentation. ?3/31:  Pt with continued dysarthria/dysphagia.  Spitting out saliva, failed bedside swallow evaluation.  Transferred to Stepdown with PCCM consultation to closely follow respiratory status ?4/1 patient emergently intubated ?4/2 remains intubated and sedated ?4/4: Palliative Care consulted to assist with goals of care and whether to pursue tracheostomy ?4/5: Plan for diuresis.  Allowing family more time for decision regarding Tracheostomy. ?4/6: Start Heparin for new onset A.fib, check thyroid panel.  Repeating Tracheal aspirate due to change in character or ETT secretions and low grade fever overnight, start empiric Aztreonam and Vancomycin. ?4/7: Pt requiring minimal vent settings FiO2 30% and PEEP of 5 will plan for SBT today and diurese with 40 mg iv lasix x1 dose.  Family still discussing goals of treatment.  Discussing if they would want pt reintubated if she fails extubation, and proceed with tracheostomy placement.  Therefore, will hold off on extubation today to allow pts family time to decide ?4/8: plan for  extubation today; will  discuss reintubation vs DNI status with patient and family post-extubation ?4/9: reintubated, unable to manage secretions ? ?Interim History / Subjective:  ?Needed Robinul x1 overnight but was stable on nasal cannula. Unfortunately decompensated this morning due to inability to manage secretions. Was reintubated with plans for tracheostomy per patient and family wishes. CXR shows RLL opacity consistent with aspiration event. ? ?Objective   ?Blood pressure (!) 126/58, pulse 68, temperature 98.4 ?F (36.9 ?C), temperature source Esophageal, resp. rate 16, height 5' 7.99" (1.727 m), weight 103.7 kg, SpO2 98 %. ?   ?Vent Mode: PRVC ?FiO2 (%):  [40 %-50 %] 40 % ?Set Rate:  [16 bmp] 16 bmp ?Vt Set:  [450 mL] 450 mL ?PEEP:  [5 cmH20] 5 cmH20 ?Plateau Pressure:  [18 cmH20] 18 cmH20  ? ?Intake/Output Summary (Last 24 hours) at 11/15/2021 0828 ?Last data filed at 11/15/2021 0805 ?Gross per 24 hour  ?Intake 693.1 ml  ?Output 1855 ml  ?Net -1161.9 ml  ? ? ?Filed Weights  ? 11/13/21 0441 11/14/21 0425 11/15/21 0415  ?Weight: 106.8 kg 104.4 kg 103.7 kg  ? ? ?REVIEW OF SYSTEMS ?Unable to obtain, pt is intubated ? ? ?PHYSICAL EXAMINATION: ?GENERAL: Elderly, Caucasian female in NAD ?HEENT: Supple, no JVD, orally intubated ?PULM: diffuse bilateral crackles and rhonchi ?CARDIO: tachycardic,  no murmur, rubs, or gallops  ?GASTRO: +BS x4, soft, obese, non distended  ?MUSC: moves all extremities but clearly weak throughout, no C/C/E  ?NEUROLOGIC: Awake, follows commands, PERRL ?SKIN: Intact no lesions or rashes present  ? ?Assessment & Plan:  ? ?Acute hypoxic, hypercapnic respiratory failure, Due to variant of GBS & aspiration pneumonitis  ?Sedation needs in setting of mechanical ventilation ?-Full vent support, implement lung protective strategies ?-Plateau pressures less than 30 cm H20 ?-Wean FiO2 & PEEP as tolerated to maintain O2 sats >/=92% ?-Follow intermittent Chest X-ray & ABG as needed ?-Fentanyl to maintain  RASS goal of 0/-1 ?-Implement VAP Bundle ?-Scheduled Duo-Nebs ?-Pulmonary toilet ?-Consult ENT for trach ? ?Diagnosis of ACUTE GBS  ?PMHx: TIA ?-Avoid sedating medications as able ?-Daily wake up assessment ?-Neuro following, appreciate input ?-Completed course of IVIG (4 doses) as per Neuro ? ?New onset Atrial Fibrillation, resolved ?PMHx: Hypertension ?-Echocardiogram 11/04/21: LVEF 73-53%, Grade I Diastolic dysfunction, RV systolic function normal, no significant valve abnormalities/stenosis/regurgitation ?-Continuous cardiac monitoring ?-Heparin drip for anticoagulation (CHA2DS2-VASc 2 score 7, HAS-BLED score 4) ?-Consult for PICC ? ?H flu HAP ?-Continue Levaquin x7d ?-Monitor fever curve ?-Trend WBC's ?-Follow culture sensivities ? ?CKD Stage IV ?-Monitor I&O's / urinary output ?-Follow BMP ?-Ensure adequate renal perfusion ?-Avoid nephrotoxic agents as able ?-Replace electrolytes as indicated ? ?Diabetes Mellitus ?-CBG's q4h; Target range of 140 to 180 ?-SSI and scheduled novolog 8 units q4hrs  ?-Follow ICU Hypo/Hyperglycemia protocol ?-Diabetes coordinator consulted appreciate input  ? ?Gout, resolved ?-Stop colchicine in setting of AKI on CKD ?-Will closely monitor ? ?Best Practice (right click and "Reselect all SmartList Selections" daily)  ?Diet/type: TF ?DVT prophylaxis: Heparin gtt  ?GI prophylaxis: Pepcid  ?Lines: N/A ?Foley: yes and is still needed ?Code Status: Full Code  ? ?Critical care provider statement:  ? ?Total critical care time: 109 minutes ?  ?Performed by: Lanney Gins MD ?  ?Critical care time was exclusive of separately billable procedures and treating other patients. ?  ?Critical care was necessary to treat or prevent imminent or life-threatening deterioration. ?  ?Critical care was time spent personally by me on the following activities: development of treatment plan with patient and/or surrogate  as well as nursing, discussions with consultants, evaluation of patient's response to  treatment, examination of patient, obtaining history from patient or surrogate, ordering and performing treatments and interventions, ordering and review of laboratory studies, ordering and review of radiographic stu

## 2021-11-15 NOTE — Progress Notes (Addendum)
? ?                                                                                                                                                     ?                                                   ?Daily Progress Note  ? ?Patient Name: Amber Fields       Date: 11/15/2021 ?DOB: 12-19-1932  Age: 88 y.o. MRN#: [id number] ?Attending Physician: Ottie Glazier, MD ?Primary Care Physician: [facility] ?Admit Date: 11/05/2021 ? ?Reason for Consultation/Follow-up: Establishing goals of care ? ?Subjective: ?Spoke with staff. Notes and labs reviewed. In to see patient and daughter is at bedside. Patient is awake and alert. She is able to shake and nod head. Daughter discusses extubation and reintubation over the weekend. Asked patient yes and no questions after explaining multiple different scenarios and unknown variables with daughter at bedside. Patient nodded that she wants the tube removed today. She shakes her head she does not want to wait until tomorrow or any time after. She shakes her head no, that she does not want it replaced, and nods she would want to be supported if she does well, and made comfortable to go home to heaven with Jesus if she does not. Asked questions multiple times in multiple ways to elicit yes and no responses in which the wishes matched. Daughter asked as well and received same. She exited to speak with family on the phone.  ? ?Re-entered to answer any questions. Daughter was confirming with patient her wishes for 1 way extubation and patient nodded to her. Questions asked about grandchildren and visitation. Discussed visitation. Daughter discusses that when patient was extubated before, she was asked if she would want a tracheostomy and she said she would,  but the family did not discuss any details with her.  ? ? ?ADDENDUM 3:10: Returned to bedside to follow up as patient has been extubated. She smiles at me. She complains of feeling congested. She does not have a  voice. She attempts to cough but cough is extremely weak. Daughter states patient was able to cough up secretions earlier, and pointed at bloody sputum in the younker tubing. Discussed aggressive and comfort path depending on how she does. Patient confirms she does not want the breathing tube replaced. She confirms she would not want the breathing tube, chest compressions or shocks, and would want to die peacefully and naturally. Daughter requests to wait to change code status to DNR/DNI until patient's son has been able to come to bedside to speak with her.  ? ?Length of Stay: 11 ? ?Current  Medications: ?Scheduled Meds:  ? aspirin  81 mg Per Tube Daily  ? chlorhexidine gluconate (MEDLINE KIT)  15 mL Mouth Rinse BID  ? Chlorhexidine Gluconate Cloth  6 each Topical Daily  ? docusate  100 mg Per Tube BID  ? famotidine  20 mg Per Tube BID  ? feeding supplement (VITAL HIGH PROTEIN)  1,000 mL Per Tube Q24H  ? glycopyrrolate  0.2 mg Intravenous BID  ? insulin aspart  0-20 Units Subcutaneous Q4H  ? insulin glargine-yfgn  5 Units Subcutaneous QHS  ? ipratropium-albuterol  3 mL Nebulization TID  ? mouth rinse  15 mL Mouth Rinse 10 times per day  ? polyethylene glycol  17 g Per Tube Daily  ? sennosides  5 mL Per Tube QHS  ? ? ?Continuous Infusions: ? sodium chloride 10 mL/hr at 11/15/21 0941  ? fentaNYL infusion INTRAVENOUS Stopped (11/15/21 8469)  ? heparin 1,150 Units/hr (11/15/21 0941)  ? levofloxacin (LEVAQUIN) IV Stopped (11/14/21 1059)  ? ? ?PRN Meds: ?acetaminophen **OR** acetaminophen (TYLENOL) oral liquid 160 mg/5 mL **OR** acetaminophen, fentaNYL ? ?Physical Exam ?Pulmonary:  ?   Comments: Intubated.  ?Neurological:  ?   Mental Status: She is alert.  ?         ? ?Vital Signs: BP (!) 126/58 (BP Location: Right Wrist)   Pulse 68   Temp 98.4 ?F (36.9 ?C) (Esophageal)   Resp 16   Ht 5' 7.99" (1.727 m)   Wt 103.7 kg   SpO2 99%   BMI 34.77 kg/m?  ?SpO2: SpO2: 99 % ?O2 Device: O2 Device: Ventilator ?O2 Flow Rate: O2  Flow Rate (L/min): 15 L/min ? ?Intake/output summary:  ?Intake/Output Summary (Last 24 hours) at 11/15/2021 0947 ?Last data filed at 11/15/2021 0941 ?Gross per 24 hour  ?Intake 727.44 ml  ?Output 1855 ml  ?Net -1127.56 ml  ? ?LBM: Last BM Date : 11/12/21 ?Baseline Weight: Weight: 99.8 kg ?Most recent weight: Weight: 103.7 kg ? ?  ? ?Patient Active Problem List  ? Diagnosis Date Noted  ? 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 Weakness 11/04/2021  ? Type 2 diabetes mellitus with hyperlipidemia ([place]) 11/04/2021  ? Obesity (BMI 30-39.9) 11/04/2021  ? Subacute neurologic deficit 11/04/2021  ? CKD (chronic kidney disease) stage 4, GFR 15-29 ml/min ([facility])   ? History of TIA  08/2019(transient ischemic attack)   ? Fall at home, initial encounter 02/10/2021  ? ? ?Palliative Care Assessment & Plan  ? ? ?Recommendations/Plan: ?Patient currently indicating she wants one way extubation. Daughter was present.  ? ?Code Status: ? ?  ?Code Status Orders  ?(From admission, onward)  ?  ? ? ?  ? ?  Start     Ordered  ? 10/12/2021 1951  Full code  Continuous       ? 10/12/2021 1951  ? ?  ?  ? ?  ? ?Code Status History   ? ? Date Active Date Inactive Code Status Order ID Comments User Context  ? 02/10/2021 1307 02/12/2021 2225 Full Code [id number]  Collier Bullock, MD ED  ? ?  ? ? ?Care plan was discussed with CCM and RN ? ?Thank you for allowing the Palliative Medicine Team to assist in the care of this patient. ? ? ?Asencion Gowda, NP ? ?Please contact Palliative Medicine Team phone at [phone] for questions and concerns.  ? ? ? ? ? ?

## 2021-11-15 NOTE — Progress Notes (Signed)
Pt. Extubated to 2lnc,no apparent distress noted at this time,sat 98,rr14. ?

## 2021-11-16 ENCOUNTER — Inpatient Hospital Stay: Payer: Medicare Other

## 2021-11-16 DIAGNOSIS — Z7189 Other specified counseling: Secondary | ICD-10-CM | POA: Diagnosis not present

## 2021-11-16 DIAGNOSIS — G610 Guillain-Barre syndrome: Secondary | ICD-10-CM | POA: Diagnosis not present

## 2021-11-16 LAB — GLUCOSE, CAPILLARY
Glucose-Capillary: 120 mg/dL — ABNORMAL HIGH (ref 70–99)
Glucose-Capillary: 126 mg/dL — ABNORMAL HIGH (ref 70–99)
Glucose-Capillary: 128 mg/dL — ABNORMAL HIGH (ref 70–99)
Glucose-Capillary: 155 mg/dL — ABNORMAL HIGH (ref 70–99)
Glucose-Capillary: 169 mg/dL — ABNORMAL HIGH (ref 70–99)
Glucose-Capillary: 171 mg/dL — ABNORMAL HIGH (ref 70–99)

## 2021-11-16 LAB — CBC
HCT: 29.8 % — ABNORMAL LOW (ref 36.0–46.0)
Hemoglobin: 9.6 g/dL — ABNORMAL LOW (ref 12.0–15.0)
MCH: 29.1 pg (ref 26.0–34.0)
MCHC: 32.2 g/dL (ref 30.0–36.0)
MCV: 90.3 fL (ref 80.0–100.0)
Platelets: 548 10*3/uL — ABNORMAL HIGH (ref 150–400)
RBC: 3.3 MIL/uL — ABNORMAL LOW (ref 3.87–5.11)
RDW: 16.1 % — ABNORMAL HIGH (ref 11.5–15.5)
WBC: 9.3 10*3/uL (ref 4.0–10.5)
nRBC: 0 % (ref 0.0–0.2)

## 2021-11-16 LAB — BASIC METABOLIC PANEL
Anion gap: 6 (ref 5–15)
BUN: 83 mg/dL — ABNORMAL HIGH (ref 8–23)
CO2: 27 mmol/L (ref 22–32)
Calcium: 9.6 mg/dL (ref 8.9–10.3)
Chloride: 109 mmol/L (ref 98–111)
Creatinine, Ser: 1.52 mg/dL — ABNORMAL HIGH (ref 0.44–1.00)
GFR, Estimated: 33 mL/min — ABNORMAL LOW (ref >60)
Glucose, Bld: 169 mg/dL — ABNORMAL HIGH (ref 70–99)
Potassium: 4.5 mmol/L (ref 3.5–5.1)
Sodium: 142 mmol/L (ref 135–145)

## 2021-11-16 LAB — MAGNESIUM: Magnesium: 2.6 mg/dL — ABNORMAL HIGH (ref 1.7–2.4)

## 2021-11-16 LAB — PHOSPHORUS: Phosphorus: 3.9 mg/dL (ref 2.5–4.6)

## 2021-11-16 MED ORDER — SENNA 8.6 MG PO TABS: 1.0000 | ORAL_TABLET | Freq: Every day | ORAL | Status: DC

## 2021-11-16 MED ORDER — DOCUSATE SODIUM 100 MG PO CAPS: 100.0000 mg | ORAL_CAPSULE | Freq: Two times a day (BID) | ORAL | Status: DC

## 2021-11-16 MED ORDER — ASPIRIN 81 MG PO CHEW: 81.0000 mg | CHEWABLE_TABLET | Freq: Every day | ORAL | Status: DC

## 2021-11-16 MED ORDER — FAMOTIDINE 20 MG PO TABS: 20.0000 mg | ORAL_TABLET | Freq: Every day | ORAL | Status: DC

## 2021-11-16 MED ORDER — POLYETHYLENE GLYCOL 3350 17 G PO PACK: 17.0000 g | PACK | Freq: Every day | ORAL | Status: DC

## 2021-11-16 MED ORDER — GLYCOPYRROLATE 0.2 MG/ML IJ SOLN
0.1000 mg | Freq: Two times a day (BID) | INTRAMUSCULAR | Status: AC
  Administered 2021-11-16: 0.1 mg via INTRAVENOUS
  Filled 2021-11-16: qty 1

## 2021-11-16 MED ORDER — METHYLPREDNISOLONE SODIUM SUCC 40 MG IJ SOLR
20.0000 mg | Freq: Every day | INTRAMUSCULAR | Status: DC
  Administered 2021-11-17: 20 mg via INTRAVENOUS
  Filled 2021-11-16: qty 1

## 2021-11-16 NOTE — Progress Notes (Signed)
? ?NAME:  Amber Fields, MRN:  [id number], DOB:  06/15/1933, LOS: 12 ?ADMISSION DATE:  10/31/2021, CONSULTATION DATE:  11/05/2021 ?REFERRING MD:  Dr. Leslye Peer, CHIEF COMPLAINT:  Upper extremity weakness, dysarthria/dysphagia  ? ?Brief Pt Description / Synopsis:  ?89 y.o. 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  Transferred to [facility] 3/31 with PCCM consulted to follow respiratory status and ability to protect her airway ?Progressive rep failure and intubated 4/1 ? ?History of Present Illness/SYNOPSIS  ?88 y.o. female with a past medical history as listed below who presented to [facility] ED on 10/30/2021 due to complaints of abrupt onset of bilateral upper extremity weakness. ? ?She was in her normal state of health until 2:30 AM on Monday evening, where she noticed bilateral upper extremity weakness, along with dysarthria.  Both of these have progressively worsened. ? ?Of note she was seen at [facility] on 3/28 and had workup including MRI and MRA of the brain which showed no acute findings, thus was discharged home. ? ?She was admitted by the hospitalist with Neurology consultation.  Concern for cervical-pharyngeal-brachial variant of guillian-barre syndrome. ? ?On 3/31, she was noted to have increased spitting of oral secretions, and failed her swallow evaluation.  NIF was 14.  She was transferred to [facility] with PCCM consultation for close monitoring of respiratory status.  ? ?11/15/21- patient is for SBT. She is with palliative in conversation for [place].  I met with daughter and we had detailed conversation.  She is s/p extubation doing well thus far.  ? ?11/16/21- patient is working with PT/OT today she reports improvement but not able to swallow confidently without laryngeal penetration. Reducing rubinol and solumedrol todday. ? ?Pertinent  Medical History  ?Chronic kidney disease Stage 4 ?Diabetes mellitus ?Hyperparathyroidism ?Hypertension ?TIA ? ?Significant  Test Results   ?MRI BRAIN 3/30>>revealed no acute intracranial process. No etiology is seen for the patient's dysarthria and weakness ?MRI CERVICAL SPINE 3/30>>C3-C4 moderate spinal canal stenosis with moderate right and mild left neural foraminal narrowing. C6-C7 moderate spinal canal stenosis with mild right neural foraminal narrowing. C5-C6 mild spinal canal stenosis and mild right neural foraminal narrowing. C2-C3 mild spinal canal stenosis, without neural foraminal narrowing. C4-C5 moderate to severe left neural foraminal narrowing. ? ?Micro Data:  ?3/30: SARS-CoV-2 & Influenza PCR>>negative ?3/31: MRSA PCR>>negative ?4/2: Tracheal aspirate>> normal respiratory flora ?4/6: Tracheal aspirate>>H flu ? ?Antimicrobials:  ?Aztreonam 4/6>>04/7 ?Vancomycin 4/6>>4/7 ?Levaquin 4/7>> ? ?Significant Hospital Events: ?Including procedures, antibiotic start and stop dates in addition to other pertinent events   ?3/29: Presented to ED in evening.  Admitted by Hospitalist.  Neurology consulted ?3/30: Seen by Neurology, concern for Guillain-barre syndrome given clinical presentation. ?3/31:  Pt with continued dysarthria/dysphagia.  Spitting out saliva, failed bedside swallow evaluation.  Transferred to Stepdown with PCCM consultation to closely follow respiratory status ?4/1 patient emergently intubated ?4/2 remains intubated and sedated ?4/4: Palliative Care consulted to assist with goals of care and whether to pursue tracheostomy ?4/5: Plan for diuresis.  Allowing family more time for decision regarding Tracheostomy. ?4/6: Start Heparin for new onset A.fib, check thyroid panel.  Repeating Tracheal aspirate due to change in character or ETT secretions and low grade fever overnight, start empiric Aztreonam and Vancomycin. ?4/7: Pt requiring minimal vent settings FiO2 30% and PEEP of 5 will plan for SBT today and diurese with 40 mg iv lasix x1 dose.  Family still discussing goals of treatment.  Discussing if they would want pt  reintubated if she fails extubation, and  proceed with tracheostomy placement.  Therefore, will hold off on extubation today to allow pts family time to decide ?4/8: plan for extubation today; will discuss reintubation vs DNI status with patient and family post-extubation ?4/9: reintubated, unable to manage secretions ? ?Interim History / Subjective:  ?Needed Robinul x1 overnight but was stable on nasal cannula. Unfortunately decompensated this morning due to inability to manage secretions. Was reintubated with plans for tracheostomy per patient and family wishes. CXR shows RLL opacity consistent with aspiration event. ? ?Objective   ?Blood pressure 135/66, pulse 71, temperature 98.1 ?F (36.7 ?C), temperature source Axillary, resp. rate 12, height 5' 7.99" (1.727 m), weight 103.5 kg, SpO2 99 %. ?   ?Vent Mode: PSV ?FiO2 (%):  [28 %] 28 % ?PEEP:  [5 cmH20] 5 cmH20 ?Pressure Support:  [5 cmH20] 5 cmH20  ? ?Intake/Output Summary (Last 24 hours) at 11/16/2021 0837 ?Last data filed at 11/16/2021 0456 ?Gross per 24 hour  ?Intake 190.92 ml  ?Output 1695 ml  ?Net -1504.08 ml  ? ? ?Filed Weights  ? 11/14/21 0425 11/15/21 0415 11/16/21 0436  ?Weight: 104.4 kg 103.7 kg 103.5 kg  ? ? ?REVIEW OF SYSTEMS ?Unable to obtain, pt extubated ? ? ?PHYSICAL EXAMINATION: ?GENERAL: Elderly, Caucasian female in NAD ?HEENT: Supple, no JVD, +adentulous ?PULM: mild rhonchi rhonchi b/l ?CARDIO: tachycardic,  no murmur, rubs, or gallops  ?GASTRO: +BS x4, soft, obese, non distended  ?MUSC: moves all extremities but clearly weak throughout, no C/C/E  ?NEUROLOGIC: Awake, follows commands, PERRL ?SKIN: Intact no lesions or rashes present  ? ?Assessment & Plan:  ? ?Acute hypoxic, hypercapnic respiratory failure, due to aspiration pneumonitis  ?Sedation needs in setting of mechanical ventilation ?-s/p extubation yesterday ?- CXR today  ?-speech and swallow today ?PT/OT  ?-continue abx for now ? ?Diagnosis of ACUTE GBS  ?PMHx: TIA ?-Avoid sedating  medications as able ?-Daily neuro stimulaion ?-Neuro following, appreciate input ?-Completed course o ?f IVIG (4 doses) as per Neuro ? ?New onset Atrial Fibrillation, resolved ?PMHx: Hypertension ?-Echocardiogram 11/04/21: LVEF 25-95%, Grade I Diastolic dysfunction, RV systolic function normal, no significant valve abnormalities/stenosis/regurgitation ?-Continuous cardiac monitoring ?-Heparin drip for anticoagulation (CHA2DS2-VASc 2 score 7, HAS-BLED score 4) ?-Consult for PICC ? ?H flu HAP ?-Continue Levaquin x7d ?-Monitor fever curve ?-Trend WBC's ?-Follow culture sensivities ? ?CKD Stage IV ?-Monitor I&O's / urinary output ?-Follow BMP ?-Ensure adequate renal perfusion ?-Avoid nephrotoxic agents as able ?-Replace electrolytes as indicated ? ?Diabetes Mellitus ?-CBG's q4h; Target range of 140 to 180 ?-SSI and scheduled novolog 8 units q4hrs  ?-Follow ICU Hypo/Hyperglycemia protocol ?-Diabetes coordinator consulted appreciate input  ? ?Gout, resolved ?-Stop colchicine in setting of AKI on CKD ?-Will closely monitor ? ?Best Practice (right click and "Reselect all SmartList Selections" daily)  ?Diet/type: TF ?DVT prophylaxis: Heparin gtt  ?GI prophylaxis: Pepcid  ?Lines: N/A ?Foley: yes and is still needed ?Code Status: Full Code  ? ?Critical care provider statement:  ? ?Total critical care time: 109 minutes ?  ?Performed by: Lanney Gins MD ?  ?Critical care time was exclusive of separately billable procedures and treating other patients. ?  ?Critical care was necessary to treat or prevent imminent or life-threatening deterioration. ?  ?Critical care was time spent personally by me on the following activities: development of treatment plan with patient and/or surrogate as well as nursing, discussions with consultants, evaluation of patient's response to treatment, examination of patient, obtaining history from patient or surrogate, ordering and performing treatments and interventions, ordering and review of  laboratory  studies, ordering and review of radiographic studies, pulse oximetry and re-evaluation of patient's condition. ?  ? ?Ottie Glazier, M.D.  ?Pulmonary & Critical Care Medicine  ? ? ? ? ?

## 2021-11-16 NOTE — Evaluation (Signed)
Physical Therapy Evaluation Patient Details Name: Amber Fields MRN: [id number] DOB: 1933-01-05 Today's Date: 11/16/2021  History of Present Illness  88 y.o. 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 Transferred to [facility] 3/31 with PCCM consulted to follow respiratory status and ability to protect her airway. Progressive rep failure and intubated 4/1, extubated 4/10  Clinical Impression  Patient resting in bed upon arrival to room; easily awakens to voice and light touch.  Alert and oriented to self, location and date; follows commands, pleasant and cooperative throughout session.  Very hoarse voice with poor vocal control/quality; difficulty to fully understand verbally at times.  Globally weak throughout all extremities, UEs > LEs, proximally > distally; requiring act assist effort from therapist to complete full ROM all extremities, all planes.  Mild edema noted to L UE; elevated on pillows for edema management.  Patient notably fatigued at this point in the day; additional mobility efforts deferred as result.  Will continue to assess/progress next session as appropriate. Did transition to chair position in bed for pulmonary hygiene, tolerance to upright and sustained truncal engagement; tolerated position well with good head/neck control, fair core engagement and stability in more upright position. Would benefit from skilled PT to address above deficits and promote optimal return to PLOF. ;recommend transition to acute inpatient rehab upon discharge for high-intensity, post-acute rehab services (pending goals of care and medical outcomes as patient progresses)        Recommendations for follow up therapy are one component of a multi-disciplinary discharge planning process, led by the attending physician.  Recommendations may be updated based on patient status, additional functional criteria and insurance authorization.  Follow Up  Recommendations Acute inpatient rehab (3hours/day) (pending goals of care and medical outcomes)    Assistance Recommended at Discharge Frequent or constant Supervision/Assistance  Patient can return home with the following  Two people to help with walking and/or transfers;Two people to help with bathing/dressing/bathroom;Assistance with cooking/housework;Direct supervision/assist for medications management;Assistance with feeding;Direct supervision/assist for financial management;Help with stairs or ramp for entrance;Assist for transportation    Equipment Recommendations    Recommendations for Other Services       Functional Status Assessment Patient has had a recent decline in their functional status and demonstrates the ability to make significant improvements in function in a reasonable and predictable amount of time.     Precautions / Restrictions Precautions Precautions: Fall Precaution Comments: NPO Restrictions Weight Bearing Restrictions: No      Mobility  Bed Mobility               General bed mobility comments: deferred secondary to global fatigue this date    Transfers                   General transfer comment: deferred secondary to global fatigue this date    Ambulation/Gait               General Gait Details: deferred secondary to global fatigue this date  Stairs            Wheelchair Mobility    Modified Rankin (Stroke Patients Only)       Balance                                             Pertinent Vitals/Pain Pain Assessment Pain Assessment: No/denies pain    Home  Living Family/patient expects to be discharged to:: Private residence Living Arrangements: Children Available Help at Discharge: Family;Available PRN/intermittently Type of Home: Mobile home Home Access: Stairs to enter   Entrance Stairs-Number of Steps: 8 (per previous documentation)   Home Layout: One level        Prior  Function Prior Level of Function : Independent/Modified Independent             Mobility Comments: Per chart, indep with ADLs, household mobilization prior to admission       Hand Dominance   Dominant Hand: Right    Extremity/Trunk Assessment   Upper Extremity Assessment Upper Extremity Assessment: Generalized weakness (grossly 1 to 2-/5 proximally (shoulders), 2+ to 3-/5 elbow, wrist and hand bilat.  Denies numbness/paresthesia)    Lower Extremity Assessment Lower Extremity Assessment: Generalized weakness (grossly 3-/5 throughout; denies numbness/paresthesia; negative clonus, negative babinski)    Cervical / Trunk Assessment Cervical / Trunk Assessment:  (good, symmetrical cervical ROM)  Communication   Communication: Expressive difficulties (poor vocal quality-very hoarse voice with poor articulation/intelligibility at times)  Cognition Arousal/Alertness: Awake/alert Behavior During Therapy: WFL for tasks assessed/performed Overall Cognitive Status: Within Functional Limits for tasks assessed                                 General Comments: Alert and oriented to self, location, date; follows simple commands; pleasant and cooperative        General Comments      Exercises Other Exercises Other Exercises: Act assist ROM, x3-4 reps to head/neck (rotation, lateral flexion, cervical flexion), bilat UEs (shoulder flex/ext/abduct/adduct, elbow flex/ext, forearm pronation/supination, wrist flex/ext and gross grasp/release), bilat LEs (ankle pumps, heel slides with resisted extension, hip abduct/adduct).  Intermittent difficulty with motor planning/coordination L LE, improves with repetition; notable fatigue in active effort/endurance with minimal repetitions. Other Exercises: Transitioned to chair position in bed for pulmonary hygiene, repositioning/pressure relief, tolerance to upright and progressive trunk control.  Tolerated position well with improved  alertness once repositioned/upright.   Assessment/Plan    PT Assessment Patient needs continued PT services  PT Problem List Decreased strength;Decreased range of motion;Decreased activity tolerance;Decreased balance;Decreased mobility;Decreased coordination;Decreased cognition;Decreased knowledge of use of DME;Decreased safety awareness;Decreased knowledge of precautions;Cardiopulmonary status limiting activity       PT Treatment Interventions DME instruction;Gait training;Stair training;Functional mobility training;Therapeutic exercise;Balance training;Patient/family education;Therapeutic activities    PT Goals (Current goals can be found in the Care Plan section)  Acute Rehab PT Goals Patient Stated Goal: agreeable to participation PT Goal Formulation: With patient Time For Goal Achievement: 11/30/21 Potential to Achieve Goals: Fair Additional Goals Additional Goal #1: Assess and establish goals for OOB activities as appropriate.    Frequency Min 2X/week     Co-evaluation               AM-PAC PT "6 Clicks" Mobility  Outcome Measure Help needed turning from your back to your side while in a flat bed without using bedrails?: A Lot Help needed moving from lying on your back to sitting on the side of a flat bed without using bedrails?: A Lot Help needed moving to and from a bed to a chair (including a wheelchair)?: Total Help needed standing up from a chair using your arms (e.g., wheelchair or bedside chair)?: Total Help needed to walk in hospital room?: Total Help needed climbing 3-5 steps with a railing? : Total 6 Click Score: 8    End  of Session   Activity Tolerance: Patient limited by fatigue Patient left: in bed;with call bell/phone within reach;with bed alarm set Nurse Communication: Mobility status PT Visit Diagnosis: Muscle weakness (generalized) (M62.81);Difficulty in walking, not elsewhere classified (R26.2);Other symptoms and signs involving the nervous system  (R29.898)    Time: 5621-3086 PT Time Calculation (min) (ACUTE ONLY): 30 min   Charges:   PT Evaluation $PT Eval High Complexity: 1 High PT Treatments $Therapeutic Activity: 8-22 mins       Ariyel Jeangilles H. Manson Passey, PT, DPT, NCS 11/16/21, 5:58 PM [phone]

## 2021-11-16 NOTE — Evaluation (Signed)
Occupational Therapy Evaluation ?Patient Details ?Name: Amber Fields ?MRN: [id number] ?DOB: 03/17/1933 ?Today's Date: 11/16/2021 ? ? ?History of Present Illness 88 y.o. 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 Transferred to [facility] 3/31 with PCCM consulted to follow respiratory status and ability to protect her airway. Progressive rep failure and intubated 4/1, extubated 4/10  ? ?Clinical Impression ?  ?Amber Fields was seen for OT evaluation this date. Prior to hospital admission, pt was Independent for mobility and ADLs. Pt lives with children and grandchildren in mobile home. Pt presents to acute OT demonstrating impaired ADL performance and functional mobility 2/2 decreased activity tolerance and functional strength/ROM/balance deficits. Pt currently requires MAX A don B socks at bed level. SETUP + SBA + MIN cues for Yankauer use - cues to remove when finished. ? ?MOD A exit R side of bed, assist for trunk. MIN A face washing sitting EOB - assist for sitting balance, tolerates ~5 min sitting. SpO2 90s on 1L [place] throughout session. Performed bed level BUE therex as described below and instructed on IS fq/use. Pt would benefit from skilled OT to address noted impairments and functional limitations (see below for any additional details). Upon hospital discharge, recommend STR to maximize pt safety and return to PLOF. ?  ? ?Recommendations for follow up therapy are one component of a multi-disciplinary discharge planning process, led by the attending physician.  Recommendations may be updated based on patient status, additional functional criteria and insurance authorization.  ? ?Follow Up Recommendations ? Skilled nursing-short term rehab (<3 hours/day)  ?  ?Assistance Recommended at Discharge Frequent or constant Supervision/Assistance  ?Patient can return home with the following Two people to help with walking and/or transfers;Two people to help  with bathing/dressing/bathroom;Help with stairs or ramp for entrance ? ?  ?Functional Status Assessment ? Patient has had a recent decline in their functional status and demonstrates the ability to make significant improvements in function in a reasonable and predictable amount of time.  ?Equipment Recommendations ? BSC/3in1;Hospital bed  ?  ?Recommendations for Other Services   ? ? ?  ?Precautions / Restrictions Precautions ?Precautions: Fall ?Restrictions ?Weight Bearing Restrictions: No  ? ?  ? ?Mobility Bed Mobility ?Overal bed mobility: Needs Assistance ?Bed Mobility: Supine to Sit, Sit to Supine, Rolling ?Rolling: Max assist ?  ?Supine to sit: Mod assist ?Sit to supine: Max assist ?  ?  ?  ? ?Transfers ?Overall transfer level: Needs assistance ?  ?Transfers: Bed to chair/wheelchair/BSC ?  ?  ?  ?  ?  ? Lateral/Scoot Transfers: Total assist ?General transfer comment: along EOB ?  ? ?  ?Balance Overall balance assessment: Needs assistance ?Sitting-balance support: Single extremity supported, Feet supported ?Sitting balance-Leahy Scale: Poor ?Sitting balance - Comments: L lateral lean ?  ?  ?  ?  ?  ?  ?  ?  ?  ?  ?  ?  ?  ?  ?  ?   ? ?ADL either performed or assessed with clinical judgement  ? ?ADL Overall ADL's : Needs assistance/impaired ?  ?  ?  ?  ?  ?  ?  ?  ?  ?  ?  ?  ?  ?  ?  ?  ?  ?  ?  ?General ADL Comments: MAX A don B socks at bed level. SETUP + SBA + MIN cues for Yankauer use - cues to remove when finished. MIN A face washing sitting EOB - assist for sitting balance  ? ? ? ? ?  Pertinent Vitals/Pain Pain Assessment ?Pain Assessment: Faces ?Faces Pain Scale: Hurts little more ?Pain Location: LUE ?Pain Descriptors / Indicators: Discomfort, Grimacing ?Pain Intervention(s): Limited activity within patient's tolerance, Repositioned  ? ? ? ?Hand Dominance Right ?  ?Extremity/Trunk Assessment Upper Extremity Assessment ?Upper Extremity Assessment: RUE deficits/detail;LUE deficits/detail ?RUE Deficits /  Details: 3/5 grip, 2-/5 RUE grossly. ~20-100* elbow flexion AROM ?LUE Deficits / Details: 2+/5 grip. 2-/5 grossly ?  ?Lower Extremity Assessment ?Lower Extremity Assessment: Generalized weakness ?  ?  ?  ?Communication Communication ?Communication: Expressive difficulties ?  ?Cognition Arousal/Alertness: Awake/alert ?Behavior During Therapy: [facility] for tasks assessed/performed ?Overall Cognitive Status: Difficult to assess ?  ?  ?  ?  ?  ?  ?  ?  ?  ?  ?  ?  ?  ?  ?  ?  ?General Comments: hoarse voice, minimal talking, follows all commands as able. ?  ?  ?   ?   ?   ? ? ?Home Living Family/patient expects to be discharged to:: Private residence ?Living Arrangements: Children ?Available Help at Discharge: Family;Available PRN/intermittently ?Type of Home: Mobile home ?Home Access: Stairs to enter ?  ?  ?Home Layout: One level ?  ?  ?Bathroom Shower/Tub: Tub/shower unit ?  ?  ?  ?  ?Home Equipment: Shower seat ?  ?  ?  ? ?  ?Prior Functioning/Environment Prior Level of Function : Independent/Modified Independent ?  ?  ?  ?  ?  ?  ?  ?  ?  ? ?  ?  ?OT Problem List: Decreased strength;Decreased range of motion;Decreased activity tolerance;Impaired balance (sitting and/or standing);Decreased safety awareness;Impaired UE functional use;Increased edema;Decreased knowledge of use of DME or AE;Decreased coordination ?  ?   ?OT Treatment/Interventions: Therapeutic exercise;Self-care/ADL training;Energy conservation;DME and/or AE instruction;Therapeutic activities;Balance training;Patient/family education  ?  ?OT Goals(Current goals can be found in the care plan section) Acute Rehab OT Goals ?Patient Stated Goal: to return to PLOF walking ?OT Goal Formulation: With patient/family ?Time For Goal Achievement: 11/30/21 ?Potential to Achieve Goals: Good ?ADL Goals ?Pt Will Perform Grooming: with set-up;with supervision;bed level ?Pt Will Perform Lower Body Dressing: with mod assist;sitting/lateral leans ?Pt Will Transfer to Toilet:  with max assist;squat pivot transfer;bedside commode  ?OT Frequency: Min 2X/week ?  ? ?   ?AM-PAC OT "6 Clicks" Daily Activity     ?Outcome Measure Help from another person eating meals?: A Little ?Help from another person taking care of personal grooming?: A Little ?Help from another person toileting, which includes using toliet, bedpan, or urinal?: A Lot ?Help from another person bathing (including washing, rinsing, drying)?: A Lot ?Help from another person to put on and taking off regular upper body clothing?: A Lot ?Help from another person to put on and taking off regular lower body clothing?: A Lot ?6 Click Score: 14 ?  ?End of Session Equipment Utilized During Treatment: Oxygen ?Nurse Communication: Mobility status ? ?Activity Tolerance: Patient tolerated treatment well ?Patient left: in bed;with call bell/phone within reach;with bed alarm set;with family/visitor present ? ?OT Visit Diagnosis: Other abnormalities of gait and mobility (R26.89);Muscle weakness (generalized) (M62.81)  ?              ?Time: 2671-2458 ?OT Time Calculation (min): 43 min ?Charges:  OT General Charges ?$OT Visit: 1 Visit ?OT Evaluation ?$OT Eval Moderate Complexity: 1 Mod ?OT Treatments ?$Self Care/Home Management : 8-22 mins ?$Therapeutic Exercise: 8-22 mins ? ?Dessie Coma, M.S. OTR/L  ?11/16/21, 1:40 PM  ?ascom [phone] ? ?

## 2021-11-16 NOTE — Evaluation (Addendum)
Clinical/Bedside Swallow Evaluation ?Patient Details  ?Name: Amber Fields ?MRN: [id number] ?Date of Birth: 02-Sep-1932 ? ?Today's Date: 11/16/2021 ?Time: SLP Start Time (ACUTE ONLY): 3335 SLP Stop Time (ACUTE ONLY): 4562 ?SLP Time Calculation (min) (ACUTE ONLY): 60 min ? ?Past Medical History:  ?Past Medical History:  ?Diagnosis Date  ? Cancer [facility])   ? skin ca  ? Chronic kidney disease   ? Diabetes mellitus without complication ([place])   ? Hyperparathyroidism ([place])   ? Hypertension   ? Transient cerebral ischemia   ? ?Past Surgical History:  ?Past Surgical History:  ?Procedure Laterality Date  ? ABDOMINAL HYSTERECTOMY    ? CATARACT EXTRACTION W/PHACO Right 04/27/2021  ? Procedure: CATARACT EXTRACTION PHACO AND INTRAOCULAR LENS PLACEMENT ([place]) RIGHT DIABETIC OMIDRIA 8.64 00:52.6;  Surgeon: Birder Robson, MD;  Location: [place];  Service: Ophthalmology;  Laterality: Right;  covid + 02-10-21  ? CATARACT EXTRACTION W/PHACO Left 05/11/2021  ? Procedure: CATARACT EXTRACTION PHACO AND INTRAOCULAR LENS PLACEMENT ([place]) LEFT DIABETIC OMIDRIA 12.32 01:31.4 ;  Surgeon: Birder Robson, MD;  Location: [place];  Service: Ophthalmology;  Laterality: Left;  ? ?HPI:  ?Amber Fields is a 88 y.o. female with medical history significant for DM, HTN, hearing impairment, CKD 4, history of TIA in January 2021, who walks independently, who presents to the ED for evaluation of 2 days of weakness of extremities, with inability to walk and inability to raise arms.  She also has reported slurred speech and difficulty swallowing.  Patient was seen at the [facility] ED on 3/28 and had a work-up that included MRI and MRA of the brain with no acute findings and she was discharged home.  She was brought back to the emergency room due to persistent, not improving symptoms.  Patient states she had a cough for the past 2 weeks which has improved but since then has had a dry cough and difficulty swallowing.  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  Medication tx of IVIG has been given.  Pt has been orally intubated/extubated x2 this admit d/t respiratory issues and inability to manage her own secretions/protect her airway(~8 days; ~2 days). MD is currently supporting management of secretions via medication per NSG.  ?  ?Assessment / Plan / Recommendation  ?Clinical Impression ? Pt appears to present w/ profound pharyngeal phase Dysphagia w/ HIGH risk for aspiration w/ any oral intake.  Recommend STRICT NPO status currently w/ frequent oral care for hygiene and stimulation of pharyngeal swallowing.  ?W/ 3 trials of ice chips, 1 /3 chip trials slightly larger than the others, pt exhibited no palpable pharyngeal swallow; no laryngeal excursion to indicate a full pharyngeal swallow. MOD+ verbal/tactile cues given for encouragement and stimulation. Pt nodded her head she was attempting/focusing on "hard swallows". Post the last trial of the larger chip bolus, pt exhibited mild s/s of respiratory discomfort and attempts at coughing. No O2 desats or HR changes; mild increase in RR. During the oral phase, pt exhibited fair oral motor and lingual movements demonstrating fair control of the bolus orally. However, noted during the the OM exam, lingual protrusion and posterior strength was reduced. Gag+.  ?These results and recommendations were discussed w/ Team; and Daughter present in room w/ education also provided on eval results and recommendations. Discussed the impact of neuromotor weakness on the swallowing; risk for aspiration and pulmonary impact. Provided hands-on education on oral care; swab kits for room.  ?Consulted  Dietician w/ recommendation for support w/ NGT for alternative means of feeding for nutrition at this time if desired by family to best support pt. 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   ?ST services will f/u on a daily basis for OM stimulation; pharyngeal swallowing stimulation and assessment to determine safety w/ oral intake. Thorough education w/ Daughter present; Team members. 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   ?Aspiration Risk ? Severe aspiration risk;Risk for inadequate nutrition/hydration  ?  ?Diet Recommendation  NPO status; frequent oral care for hygiene and stimulation of swallowing ? ?Medication Administration: Via alternative means  ?  ?Other  Recommendations Recommended Consults:  (Neuorolgy f/u; ENT f/u d/t little to no VC contact during phonation/voicing/cough) ?Oral Care Recommendations: Oral care QID;Staff/trained caregiver to provide oral care (stimulation and hygiene)   ? ?Recommendations for follow up therapy are one component of a multi-disciplinary discharge planning process, led by the attending physician.  Recommendations may be updated based on patient status, additional functional criteria and insurance authorization. ? ?Follow up Recommendations Skilled nursing-short term rehab (<3 hours/day) (TBD)  ? ? ?  ?Assistance Recommended at Discharge Frequent or constant Supervision/Assistance  ?Functional Status Assessment Patient has had a recent decline in their functional status and/or demonstrates limited ability to make significant improvements in function in a reasonable and predictable amount of time  ?Frequency and Duration min 3x week  ?2 weeks ?  ?   ? 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 ?  ? ?Swallow Study   ?General Date of Onset: 11/04/21 ?HPI:  Amber Fields is a 88 y.o. female with medical history significant for DM, HTN, hearing impairment, CKD 4, history of TIA in January 2021, who walks independently, who presents to the ED for evaluation of 2 days of weakness of extremities, with inability to walk and inability to raise arms.  She also has reported slurred speech and difficulty swallowing.  Patient was seen at [facility] ED on 3/28 and had a work-up that included MRI and MRA of the brain with no acute findings and she was discharged home.  She was brought back to the emergency room due to persistent, not improving symptoms.  Patient states she had a cough for the past 2 weeks which has improved but since then has had a dry cough and difficulty swallowing.  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  Medication tx of IVIG has been given.  Pt has been orally intubated/extubated x2 this admit d/t respiratory issues and inability to manage her own secretions/protect her airway(~8 days; ~2 days). MD is currently supporting management of secretions via medication per NSG. ?Type of Study: Bedside Swallow Evaluation (repeat s/p another oral intubation this admit) ?Previous Swallow Assessment: Previous MBSS in 2017- swallow function WFL at that time. BSE this admit revealed Dysphagia. ?Diet Prior to this Study: NPO ?Temperature Spikes Noted: No (wbc 9.3) ?Respiratory Status: Nasal cannula (2L) ?History of Recent Intubation: Yes ?Behavior/Cognition: Alert;Cooperative;Pleasant mood;Distractible;Requires cueing (HOH) ?Oral Cavity Assessment: Dry (sticky) ?Oral Care Completed by SLP: Yes ?Oral Cavity - Dentition: Dentures, top;Dentures, bottom (removed for cleaning) ?Vision: Functional for self-feeding ?Self-Feeding Abilities: Needs assist;Needs set up;Total assist ?Patient  Positioning: Upright in bed (needed positioning) ?Baseline Vocal Quality:   (dysphonia/aphonia) ?Volitional Cough: Weak (breathy) ?Volitional Swallow: Unable to elicit  ?  ?Oral/Motor/Sensory Function Overall Oral Motor/Sensory Function: Moderate impairment ?Facial ROM: Within Functional Limits ?Facial Symmet

## 2021-11-16 NOTE — Progress Notes (Addendum)
? ?                                                                                                                                                     ?                                                   ?Daily Progress Note  ? ?Patient Name: Amber Fields       Date: 11/16/2021 ?DOB: Apr 30, 1933  Age: 88 y.o. MRN#: [id number] ?Attending Physician: Ottie Glazier, MD ?Primary Care Physician: [facility] ?Admit Date: 11/02/2021 ? ?Reason for Consultation/Follow-up: Establishing goals of care ? ?Subjective: ?Notes and labs reviewed. In to see patient. SLP in working with patient. Daughter is present and watching closely, and asking questions. Patient smiles at me. I had intended to readdress code status today as yesterday post extubation, patient confirmed DNR/DNI status, but daughter requested to wait to formally change code status until patient was able to talk to her son yesterday afternoon; but with broaching concerns for patient's weak cough, daughter quickly states things have been going well and they are very hopeful for her recovery, and refocused on SLP. SLP then began to work to educate daughter, and daughter was eager. PMT stepped out.  ? ?CCM is currently managing care as patient is not showing signs of decline at this time. Patient and family need time for outcomes. I will not be here Wednesday, but will follow up Thursday on my return.  ? ?Length of Stay: 12 ? ?Current Medications: ?Scheduled Meds:  ? aspirin  81 mg Oral Daily  ? chlorhexidine gluconate (MEDLINE KIT)  15 mL Mouth Rinse BID  ? Chlorhexidine Gluconate Cloth  6 each Topical Daily  ? docusate sodium  100 mg Oral BID  ? famotidine  20 mg Oral Daily  ? glycopyrrolate  0.1 mg Intravenous BID  ? heparin injection (subcutaneous)  5,000 Units Subcutaneous Q8H  ? insulin aspart  0-20 Units Subcutaneous Q4H  ? insulin glargine-yfgn  5 Units Subcutaneous QHS  ? ipratropium-albuterol  3 mL Nebulization TID  ? [START ON 11/17/2021]  methylPREDNISolone (SOLU-MEDROL) injection  20 mg Intravenous Daily  ? polyethylene glycol  17 g Oral Daily  ? senna  1 tablet Oral Daily  ? ? ?Continuous Infusions: ? sodium chloride 10 mL/hr at 11/16/21 0000  ? levofloxacin (LEVAQUIN) IV Stopped (11/14/21 1059)  ? ? ?PRN Meds: ?acetaminophen **OR** acetaminophen (TYLENOL) oral liquid 160 mg/5 mL **OR** acetaminophen ? ?Physical Exam ?Pulmonary:  ?   Effort: Pulmonary effort is normal.  ?Neurological:  ?   Mental Status: She is alert.  ?         ? ?  Vital Signs: BP (!) 143/54   Pulse 87   Temp 97.6 ?F (36.4 ?C) (Oral)   Resp 15   Ht 5' 7.99" (1.727 m)   Wt 103.5 kg   SpO2 99%   BMI 34.70 kg/m?  ?SpO2: SpO2: 99 % ?O2 Device: O2 Device: Nasal Cannula ?O2 Flow Rate: O2 Flow Rate (L/min): 2 L/min ? ?Intake/output summary:  ?Intake/Output Summary (Last 24 hours) at 11/16/2021 1201 ?Last data filed at 11/16/2021 0956 ?Gross per 24 hour  ?Intake 123.71 ml  ?Output 1475 ml  ?Net -1351.29 ml  ? ?LBM: Last BM Date : 11/12/21 ?Baseline Weight: Weight: 99.8 kg ?Most recent weight: Weight: 103.5 kg ? ?Patient Active Problem List  ? Diagnosis Date Noted  ? 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 Weakness 11/04/2021  ? Type 2 diabetes mellitus with hyperlipidemia ([place]) 11/04/2021  ? Obesity (BMI 30-39.9) 11/04/2021  ? Subacute neurologic deficit 11/04/2021  ? CKD (chronic kidney disease) stage 4, GFR 15-29 ml/min (HCC)   ? History of TIA  08/2019(transient ischemic attack)   ? Fall at home, initial encounter 02/10/2021  ? ? ?Palliative Care Assessment & Plan  ? ? ? ?Recommendations/Plan: ?With broaching concerns for weak cough daughter states they are very hopeful for her recovery. I will not be here Wednesday, but will follow up Thursday on my return. Patient and family need time for outcomes. ?CCM is currently managing care.  ? ? ? ?Code Status: ? ?  ?Code Status Orders  ?(From admission, onward)  ?  ? ? ?  ? ?  Start     Ordered  ? 11/02/2021 1951  Full code   Continuous       ? 10/30/2021 1951  ? ?  ?  ? ?  ? ?Code Status History   ? ? Date Active Date Inactive Code Status Order ID Comments User Context  ? 02/10/2021 1307 02/12/2021 2225 Full Code [id number]  Collier Bullock, MD ED  ? ?  ? ? ?Care plan was discussed with RN,SLP ? ?Thank you for allowing the Palliative Medicine Team to assist in the care of this patient. ? ? ?Asencion Gowda, NP ? ?Please contact Palliative Medicine Team phone at [phone] for questions and concerns.  ? ? ? ? ? ?

## 2021-11-17 DIAGNOSIS — M6281 Muscle weakness (generalized): Secondary | ICD-10-CM | POA: Diagnosis not present

## 2021-11-17 DIAGNOSIS — G610 Guillain-Barre syndrome: Secondary | ICD-10-CM | POA: Diagnosis not present

## 2021-11-17 DIAGNOSIS — R1310 Dysphagia, unspecified: Secondary | ICD-10-CM | POA: Diagnosis not present

## 2021-11-17 DIAGNOSIS — R29818 Other symptoms and signs involving the nervous system: Secondary | ICD-10-CM | POA: Diagnosis not present

## 2021-11-17 LAB — CBC
HCT: 31.5 % — ABNORMAL LOW (ref 36.0–46.0)
Hemoglobin: 10 g/dL — ABNORMAL LOW (ref 12.0–15.0)
MCH: 29.7 pg (ref 26.0–34.0)
MCHC: 31.7 g/dL (ref 30.0–36.0)
MCV: 93.5 fL (ref 80.0–100.0)
Platelets: 537 10*3/uL — ABNORMAL HIGH (ref 150–400)
RBC: 3.37 MIL/uL — ABNORMAL LOW (ref 3.87–5.11)
RDW: 16.9 % — ABNORMAL HIGH (ref 11.5–15.5)
WBC: 13 10*3/uL — ABNORMAL HIGH (ref 4.0–10.5)
nRBC: 0 % (ref 0.0–0.2)

## 2021-11-17 LAB — GLUCOSE, CAPILLARY
Glucose-Capillary: 88 mg/dL (ref 70–99)
Glucose-Capillary: 104 mg/dL — ABNORMAL HIGH (ref 70–99)
Glucose-Capillary: 115 mg/dL — ABNORMAL HIGH (ref 70–99)

## 2021-11-17 LAB — PHOSPHORUS: Phosphorus: 2.9 mg/dL (ref 2.5–4.6)

## 2021-11-17 LAB — BASIC METABOLIC PANEL
Anion gap: 5 (ref 5–15)
BUN: 75 mg/dL — ABNORMAL HIGH (ref 8–23)
CO2: 28 mmol/L (ref 22–32)
Calcium: 10 mg/dL (ref 8.9–10.3)
Chloride: 113 mmol/L — ABNORMAL HIGH (ref 98–111)
Creatinine, Ser: 1.51 mg/dL — ABNORMAL HIGH (ref 0.44–1.00)
GFR, Estimated: 33 mL/min — ABNORMAL LOW (ref >60)
Glucose, Bld: 112 mg/dL — ABNORMAL HIGH (ref 70–99)
Potassium: 4 mmol/L (ref 3.5–5.1)
Sodium: 146 mmol/L — ABNORMAL HIGH (ref 135–145)

## 2021-11-17 LAB — MAGNESIUM: Magnesium: 2.6 mg/dL — ABNORMAL HIGH (ref 1.7–2.4)

## 2021-11-17 MED ORDER — BLISTEX MEDICATED EX OINT
TOPICAL_OINTMENT | CUTANEOUS | Status: DC | PRN
  Filled 2021-11-17 (×2): qty 6.3

## 2021-11-17 MED ORDER — ENOXAPARIN SODIUM 30 MG/0.3ML IJ SOSY: 30.0000 mg | PREFILLED_SYRINGE | INTRAMUSCULAR | Status: DC

## 2021-11-17 MED ORDER — RACEPINEPHRINE HCL 2.25 % IN NEBU
0.5000 mL | INHALATION_SOLUTION | Freq: Once | RESPIRATORY_TRACT | Status: AC
  Administered 2021-11-17: 0.5 mL via RESPIRATORY_TRACT

## 2021-11-17 MED ORDER — MORPHINE SULFATE (PF) 2 MG/ML IV SOLN
2.0000 mg | Freq: Once | INTRAVENOUS | Status: AC
  Administered 2021-11-17: 2 mg via INTRAVENOUS
  Filled 2021-11-17: qty 1

## 2021-11-17 MED ORDER — GLYCOPYRROLATE 0.2 MG/ML IJ SOLN
0.2000 mg | Freq: Once | INTRAMUSCULAR | Status: AC
  Administered 2021-11-17: 0.2 mg via INTRAVENOUS

## 2021-11-17 MED ORDER — ACETAMINOPHEN 160 MG/5ML PO SOLN
650.0000 mg | ORAL | Status: DC | PRN
  Filled 2021-11-17: qty 20.3

## 2021-11-17 MED ORDER — ACETAMINOPHEN 650 MG RE SUPP: 650.0000 mg | RECTAL | Status: DC | PRN

## 2021-11-17 MED ORDER — MORPHINE SULFATE (PF) 2 MG/ML IV SOLN
1.0000 mg | INTRAVENOUS | Status: DC | PRN
  Administered 2021-11-17 – 2021-11-18 (×10): 2 mg via INTRAVENOUS
  Filled 2021-11-17 (×10): qty 1

## 2021-11-17 MED ORDER — LORAZEPAM 2 MG/ML IJ SOLN
1.0000 mg | INTRAMUSCULAR | Status: DC | PRN
  Administered 2021-11-17 – 2021-11-18 (×2): 2 mg via INTRAVENOUS
  Filled 2021-11-17 (×2): qty 1

## 2021-11-17 MED ORDER — GLYCOPYRROLATE 0.2 MG/ML IJ SOLN
0.2000 mg | INTRAMUSCULAR | Status: DC | PRN
  Administered 2021-11-17 – 2021-11-18 (×5): 0.2 mg via INTRAVENOUS
  Filled 2021-11-17 (×6): qty 1

## 2021-11-17 MED ORDER — IPRATROPIUM-ALBUTEROL 0.5-2.5 (3) MG/3ML IN SOLN: 3.0000 mL | RESPIRATORY_TRACT | Status: DC | PRN

## 2021-11-17 MED ORDER — DEXAMETHASONE SODIUM PHOSPHATE 10 MG/ML IJ SOLN
10.0000 mg | Freq: Two times a day (BID) | INTRAMUSCULAR | Status: DC
  Administered 2021-11-17 (×2): 10 mg via INTRAVENOUS
  Filled 2021-11-17 (×2): qty 1

## 2021-11-17 MED ORDER — GLYCOPYRROLATE 0.2 MG/ML IJ SOLN
INTRAMUSCULAR | Status: AC
  Filled 2021-11-17: qty 1

## 2021-11-17 MED ORDER — ACETAMINOPHEN 325 MG PO TABS: 650.0000 mg | ORAL_TABLET | ORAL | Status: DC | PRN

## 2021-11-17 MED ORDER — FUROSEMIDE 10 MG/ML IJ SOLN
20.0000 mg | Freq: Every day | INTRAMUSCULAR | Status: DC
  Administered 2021-11-17: 20 mg via INTRAVENOUS
  Filled 2021-11-17: qty 2

## 2021-11-17 NOTE — Progress Notes (Addendum)
Nutrition Follow Up Note  ? ?DOCUMENTATION CODES:  ? ?Obesity unspecified ? ?INTERVENTION:  ? ?If NGT placed, recommend:  ? ?Osmolite 1.5_0 /hr- Initiate at 71m/hr and increase by 122mhr q 8 hours until goal rate is reached.  ? ?Free water flushes 10049m4 hours  ? ?Regimen provides 2160kcal/day, 90g/day protein and 1697m80my of free  ? ?Pt at high refeed risk; recommend monitor potassium, magnesium and phosphorus labs daily until stable ? ?NUTRITION DIAGNOSIS:  ? ?Inadequate oral intake related to inability to eat (pt sedated and ventilated) as evidenced by NPO status. ? ?GOAL:  ? ?Patient will meet greater than or equal to 90% of their needs ?-not met  ? ?MONITOR:  ? ?Diet advancement, Labs, Weight trends, Skin, I & O's ? ?ASSESSMENT:  ? ?88 y78 female with h/o CKD IV, TIA, DM, HTN, hyperthyroidism, H. pylori, ANA positive and gout who is admitted with suspected Guillain-Barre Syndrome and aspiration requiring intubation and ventilation on 4/1. Pt also noted to have new Afib.  ? ?Pt seen by SLP and will remain NPO due to high risk of aspiration. NGT attempted today by RN but was unable to be placed. Palliative care following. Plan is to re-address GOC [place]. Pt can have NGT placed in fluoroscopy if needed. Pt has been without adequate nutrition for 3 days and is likely at refeed risk. No BM noted since 4/7; NP aware.  ? ?Per chart, pt is up ~8lbs since admission. Pt +691ml60mher I & Os.  ? ?Medications reviewed and include: aspirin, dexamethasone, colace, lovenox, lasix, insulin, pepcid, miralax, senokot ? ?Labs reviewed: Na 146(H), K 4.0 wnl, BUN 75(H), creat 1.51(H), P 2.9 wnl, Mg 2.6(H) ?Wbc- 13.0(H), Hgb 10.0(L), Hct 31.5(L) ?Cbgs- 115, 105, 88 x 24hrs  ? ?Diet Order:   ? ?Diet Order   ? ?       ?  Diet NPO time specified  Diet effective now       ?  ? ?  ?  ? ?  ? ?EDUCATION NEEDS:  ? ?No education needs have been identified at this time ? ?Skin:  Skin Assessment: Reviewed RN Assessment  (ecchymosis) ? ?Last BM:  4/7- TYPE 7 ? ?Height:  ? ?Ht Readings from Last 1 Encounters:  ?11/12/21 5' 7.99" (1.727 m)  ? ? ?Weight:  ? ?Wt Readings from Last 1 Encounters:  ?11/17/21 103.5 kg  ? ? ?Ideal Body Weight:  63.6 kg ? ?BMI:  Body mass index is 34.7 kg/m?. ? ?Estimated Nutritional Needs:  ? ?Kcal:  1800-2100kcal/day ? ?Protein:  90-105g/day ? ?Fluid:  1.7-1.9L/day ? ?CaseyKoleen DistanceRD, LDN ?Please refer to AMION for RD and/or RD on-call/weekend/after hours pager ? ?

## 2021-11-17 NOTE — Progress Notes (Signed)
Discussed nasogastric tube placement for nutrition with pts daughter Stan Head and pt Mrs.Sherrill due to pts inability to swallow safely and HIGH RISK for aspiration.  Mrs. Toma Copier has agreed with nasogastric tube placement for nutrition.  Mrs. Claudio states "I just want to feel better." ? ?Donell Beers, AGNP  ?Pulmonary/Critical Care ?Pager [phone] (please enter 7 digits) ?PCCM Consult Pager [phone] (please enter 7 digits)   ?

## 2021-11-17 NOTE — Plan of Care (Signed)
  Problem: Safety: Goal: Ability to remain free from injury will improve Outcome: Progressing   Problem: Pain Managment: Goal: General experience of comfort will improve Outcome: Progressing   

## 2021-11-17 NOTE — Progress Notes (Signed)
Attempted small bore feeding tube insertion. Made it to 45 cm but patient would not stop gagging and coughing when attempting to advance further. Saturations dipped form 97% down to 94% before attempt stopped (on 2L nasal cannula). Dana NP at bedside and mentioned that previous OG when patient on ventilator had to be placed with glidescope. Instructed RN to remove attempted feeding tube. ?

## 2021-11-17 NOTE — TOC Progression Note (Signed)
Transition of Care (TOC) - Progression Note  ? ? ?Patient Details  ?Name: Amber Fields ?MRN: [id number] ?Date of Birth: May 07, 1933 ? ?Transition of Care (TOC) CM/SW Contact  ?Shelbie Hutching, RN ?Phone Number: ?11/17/2021, 11:31 AM ? ?Clinical Narrative:    ?Patient tolerating [place] 2L.  Patient is still NPO has not passed swallow eval.  TOC continues to follow.   ? ? ?Expected Discharge Plan: [place] ?Barriers to Discharge: Continued Medical Work up ? ?Expected Discharge Plan and Services ?Expected Discharge Plan: [place] ?  ?Discharge Planning Services: CM Consult ?  ?Living arrangements for the past 2 months: [place] ?                ?DME Arranged: N/A ?DME Agency: NA ?  ?  ?  ?  ?  ?  ?  ?  ? ? ?Social Determinants of Health (SDOH) Interventions ?  ? ?Readmission Risk Interventions ?   ? View : No data to display.  ?  ?  ?  ? ? ?

## 2021-11-17 NOTE — Progress Notes (Signed)
Inpatient Rehab Admissions Coordinator:  ? ?Per therapy recommendations, patient was screened for CIR candidacy by Clemens Catholic, MS, CCC-SLP . At this time, Pt. is not yet at a level where I believe she could tolerate the intensity of CIR; however,   Pt. may have potential to progress to becoming a potential CIR candidate, so CIR admissions team will follow and monitor for progress and participation with therapies and place consult order if Pt. appears to be an appropriate candidate. Please contact me with any questions. ? ?Clemens Catholic, MS, CCC-SLP ?Rehab Admissions Coordinator  ?[phone] (celll) ?[phone] (office) ?  ? ?

## 2021-11-17 NOTE — Progress Notes (Signed)
Neurology progress note ? ?Interval hx: ?Patient was extubated two days ago and respiratory status remains tenuous. No swallowing function at all on SLP evaluation. Aspirated ice chips this AM. Visibly uncomfortable. Extremely high risk of recurrent respiratory failure and aspiration. Unable to tolerate NGT placement today. Expressed to multiple physicians and family members multiple times that she does not want to be reintubated if she were to go into respiratory failure again. This AM, family acknowledges that her wish is to be allowed to pass away comfortably but say they are not ready to allow this.  ? ?Exam: ?Vitals:  ? 11/17/21 1400 11/17/21 1500  ?BP: (!) 127/93 (!) 153/65  ?Pulse: (!) 55 76  ?Resp: (!) 25 18  ?Temp:    ?SpO2: 98% 96%  ? ?Gen: In bed, intubated.  ? ?Neuro: ?MS: opens eyes, follows commands, engages with examiner, follows some simple commands such as moving her hands and feet ?CN: Eyes are conjuagate, EOMI, pupils equal round and reactive today ?Motor: she has proximall >> distal weaknes of bilateral UE, moving her bilateral hands but not deltoids.  She can wiggle bilateral feet but cannot move her legs antigravity ?TWK:MQKMMN ? ?Pertinent Labs: ? ?Basic Metabolic Panel: ?Recent Labs  ?Lab 11/13/21 ?8177 11/14/21 ?1165 11/15/21 ?7903 11/16/21 ?8333 11/17/21 ?0224  ?NA 134* 137 141 142 146*  ?K 4.2 4.4 4.3 4.5 4.0  ?CL 102 103 107 109 113*  ?CO2 '23 26 27 27 28  '$ ?GLUCOSE 163* 149* 131* 169* 112*  ?BUN 94* 99* 93* 83* 75*  ?CREATININE 1.76* 1.82* 1.84* 1.52* 1.51*  ?CALCIUM 9.4 9.7 9.5 9.6 10.0  ?MG 2.2 2.4 2.5* 2.6* 2.6*  ?PHOS 4.0 4.6 3.9 3.9 2.9  ? ? ? ?CBC: ?Recent Labs  ?Lab 11/13/21 ?8329 11/14/21 ?1916 11/15/21 ?6060 11/16/21 ?0459 11/17/21 ?0224  ?WBC 16.1* 12.1* 11.6* 9.3 13.0*  ?NEUTROABS 11.5*  --   --   --   --   ?HGB 9.1* 9.8* 9.7* 9.6* 10.0*  ?HCT 27.8* 29.0* 29.4* 29.8* 31.5*  ?MCV 90.0 88.1 90.7 90.3 93.5  ?PLT 524* 579* 576* 548* 537*  ? ? ? ?Coagulation Studies: ?No results for  input(s): LABPROT, INR in the last 72 hours.  ? ? 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  Patient was extubated two days ago and respiratory status remains tenuous. No swallowing function at all on SLP evaluation. Aspirated ice chips this AM. Visibly uncomfortable. Expressed to multiple physicians and family members multiple times that she does not want to be reintubated if she were to go into respiratory failure again. This AM, family acknowledges that her wish is to be allowed to pass away comfortably but say they are not ready to allow this.  ? ?This afternoon I had very extensive conversations with family and Dr. Lanney Gins today and communicated that patient is cognitively intact and competent to make her own decisions. She has repeatedly expressed that she does not want to be re-intubated in the event of recurrent respiratory failure and that we have to honor her wishes. Family asked if she might benefit from a second round of IVIG, but I explained to them that the evidence shows no benefit in outcomes in retreated groups  (aside from subgroup of transient improvement then decompensation which she does not fall into - her decompensation precipitating most recent intubation was multifactorial) but does show increased adverse events  in retreated groups. Ultimately family decided to honor their mother's wishes, change code status to DNAR, and support her transition to comfort care today. ? ?- Comfort care measures only ? ?Neurology to sign off, please re-engage if additional neurologic concerns arise. ? ?This patient is critically ill and at significant risk of neurological worsening, death and care requires constant monitoring of vital signs, hemodynamics,respiratory and cardiac monitoring,  neurological assessment, discussion with family, other specialists and medical decision making of high complexity. I spent 135 minutes of neurocritical care time  in the care of  this patient. This was time spent independent of any time provided by nurse practitioner or PA. ? ?Su Monks, MD ?[facility] ?[phone] ? ?If 7pm- 7am, please page neurology on call as listed in [place]. ? ?

## 2021-11-17 NOTE — Progress Notes (Signed)
SLP Cancellation Note ? ?Patient Details ?Name: Amber Fields ?MRN: [id number] ?DOB: November 23, 1932 ? ? ?Cancelled treatment:       Reason Eval/Treat Not Completed: Other (comment) (MD and NP verbalizing hold on therapy this date.) SLP spoke with Dr Quinn Axe and NP Hinton Dyer regarding plan for dysphagia intervention. Both providers endorsed preference for holding therapy this date to address placement of feeding tube and further conversations for [place]. Based on current presentation and results of evaluation on 4/11, recommend continuation of strict NPO with frequent oral care. Monitor for secretion management. Pt is HIGH risk for aspiration. SLP will follow up pending medical appropriateness, further discussion of [place], and agreement of medical team. ? ?[place] J Clapp, [place] CCC-SLP ? ? ?[place] J Clapp ?11/17/2021, 12:54 PM ?

## 2021-11-17 NOTE — Progress Notes (Signed)
? ?NAME:  Amber Fields, MRN:  [id number], DOB:  [birth year], LOS: 10 ?ADMISSION DATE:  10/28/2021, CONSULTATION DATE:  11/05/2021 ?REFERRING MD:  Dr. Leslye Peer, CHIEF COMPLAINT:  Upper extremity weakness, dysarthria/dysphagia  ? ?Brief Pt Description / Synopsis:  ?88 y.o. 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  Transferred to [facility] 3/31 with PCCM consulted to follow respiratory status and ability to protect her airway ?Progressive rep failure and intubated 4/1 ? ?History of Present Illness/SYNOPSIS  ?88 y.o. female with a past medical history as listed below who presented to [facility] ED on 10/08/2021 due to complaints of abrupt onset of bilateral upper extremity weakness. ? ?She was in her normal state of health until 2:30 AM on Monday evening, where she noticed bilateral upper extremity weakness, along with dysarthria.  Both of these have progressively worsened. ? ?Of note she was seen at [facility] on 3/28 and had workup including MRI and MRA of the brain which showed no acute findings, thus was discharged home. ? ?She was admitted by the hospitalist with Neurology consultation.  Concern for cervical-pharyngeal-brachial variant of guillian-barre syndrome. ? ?On 3/31, she was noted to have increased spitting of oral secretions, and failed her swallow evaluation.  NIF was 14.  She was transferred to [facility] with PCCM consultation for close monitoring of respiratory status.  ? ?11/15/21- patient is for SBT. She is with palliative in conversation for [place].  I met with daughter and we had detailed conversation.  She is s/p extubation doing well thus far.  ? ?11/16/21- patient is working with PT/OT today she reports improvement but not able to swallow confidently without laryngeal penetration. Reducing rubinol and solumedrol todday. ? ?11/17/21- patient is with labored breathing today and is unable to clear secretions with reduction of glycopyrrolate and steroids.  She  was seen by neurology and seems to have poor prognosis based on clinical decline.  ? ?Pertinent  Medical History  ?Chronic kidney disease Stage 4 ?Diabetes mellitus ?Hyperparathyroidism ?Hypertension ?TIA ? ?Significant Test Results   ?MRI BRAIN 3/30>>revealed no acute intracranial process. No etiology is seen for the patient's dysarthria and weakness ?MRI CERVICAL SPINE 3/30>>C3-C4 moderate spinal canal stenosis with moderate right and mild left neural foraminal narrowing. C6-C7 moderate spinal canal stenosis with mild right neural foraminal narrowing. C5-C6 mild spinal canal stenosis and mild right neural foraminal narrowing. C2-C3 mild spinal canal stenosis, without neural foraminal narrowing. C4-C5 moderate to severe left neural foraminal narrowing. ? ?Micro Data:  ?3/30: SARS-CoV-2 & Influenza PCR>>negative ?3/31: MRSA PCR>>negative ?4/2: Tracheal aspirate>> normal respiratory flora ?4/6: Tracheal aspirate>>H flu ? ?Antimicrobials:  ?Aztreonam 4/6>>04/7 ?Vancomycin 4/6>>4/7 ?Levaquin 4/7>> ? ?Significant Hospital Events: ?Including procedures, antibiotic start and stop dates in addition to other pertinent events   ?3/29: Presented to ED in evening.  Admitted by Hospitalist.  Neurology consulted ?3/30: Seen by Neurology, concern for Guillain-barre syndrome given clinical presentation. ?3/31:  Pt with continued dysarthria/dysphagia.  Spitting out saliva, failed bedside swallow evaluation.  Transferred to Stepdown with PCCM consultation to closely follow respiratory status ?4/1 patient emergently intubated ?4/2 remains intubated and sedated ?4/4: Palliative Care consulted to assist with goals of care and whether to pursue tracheostomy ?4/5: Plan for diuresis.  Allowing family more time for decision regarding Tracheostomy. ?4/6: Start Heparin for new onset A.fib, check thyroid panel.  Repeating Tracheal aspirate due to change in character or ETT secretions and low grade fever overnight, start empiric Aztreonam and  Vancomycin. ?4/7: Pt requiring minimal vent settings FiO2 30% and  PEEP of 5 will plan for SBT today and diurese with 40 mg iv lasix x1 dose.  Family still discussing goals of treatment.  Discussing if they would want pt reintubated if she fails extubation, and proceed with tracheostomy placement.  Therefore, will hold off on extubation today to allow pts family time to decide ?4/8: plan for extubation today; will discuss reintubation vs DNI status with patient and family post-extubation ?4/9: reintubated, unable to manage secretions ? ?Interim History / Subjective:  ?Needed Robinul x1 overnight but was stable on nasal cannula. Unfortunately decompensated this morning due to inability to manage secretions. Was reintubated with plans for tracheostomy per patient and family wishes. CXR shows RLL opacity consistent with aspiration event. ? ?Objective   ?Blood pressure (!) 159/70, pulse 67, temperature 98.2 ?F (36.8 ?C), temperature source Oral, resp. rate (!) 21, height 5' 7.99" (1.727 m), weight 103.5 kg, SpO2 97 %. ?   ?   ? ?Intake/Output Summary (Last 24 hours) at 11/17/2021 0841 ?Last data filed at 11/17/2021 0500 ?Gross per 24 hour  ?Intake 424.53 ml  ?Output 650 ml  ?Net -225.47 ml  ? ? ?Filed Weights  ? 11/15/21 0415 11/16/21 0436 11/17/21 0448  ?Weight: 103.7 kg 103.5 kg 103.5 kg  ? ? ?REVIEW OF SYSTEMS ?Unable to obtain, pt extubated ? ? ?PHYSICAL EXAMINATION: ?GENERAL: Elderly, Caucasian female in NAD ?HEENT: Supple, no JVD, +adentulous ?PULM: mild rhonchi rhonchi b/l ?CARDIO: tachycardic,  no murmur, rubs, or gallops  ?GASTRO: +BS x4, soft, obese, non distended  ?MUSC: moves all extremities but clearly weak throughout, no C/C/E  ?NEUROLOGIC: Awake, follows commands, PERRL ?SKIN: Intact no lesions or rashes present  ? ?Assessment & Plan:  ? ?Acute hypoxic, hypercapnic respiratory failure, due to aspiration pneumonitis  ?-s/p extubation now on low dose nasal canula ?- CXR today  ?-speech and swallow today ?PT/OT   ?-continue abx for now ? ?Diagnosis of ACUTE GBS  ?PMHx: TIA ?-Avoid sedating medications as able ?-Daily neuro stimulaion ?-Neuro following, appreciate input ?-Completed course o ?f IVIG (4 doses) as per Neuro ? ?New onset Atrial Fibrillation, resolved ?PMHx: Hypertension ?-Echocardiogram 11/04/21: LVEF 60-10%, Grade I Diastolic dysfunction, RV systolic function normal, no significant valve abnormalities/stenosis/regurgitation ?-Continuous cardiac monitoring ?-Heparin drip for anticoagulation (CHA2DS2-VASc 2 score 7, HAS-BLED score 4) ?-Consult for PICC ? ?H flu HAP ?-Continue Levaquin x7d ?-Monitor fever curve ?-Trend WBC's ?-Follow culture sensivities ? ?CKD Stage IV ?-Monitor I&O's / urinary output ?-Follow BMP ?-Ensure adequate renal perfusion ?-Avoid nephrotoxic agents as able ?-Replace electrolytes as indicated ? ?Diabetes Mellitus ?-CBG's q4h; Target range of 140 to 180 ?-SSI and scheduled novolog 8 units q4hrs  ?-Follow ICU Hypo/Hyperglycemia protocol ?-Diabetes coordinator consulted appreciate input  ? ?Gout, resolved ?-Stop colchicine in setting of AKI on CKD ?-Will closely monitor ? ?Best Practice (right click and "Reselect all SmartList Selections" daily)  ?Diet/type: TF ?DVT prophylaxis: Heparin gtt  ?GI prophylaxis: Pepcid  ?Lines: N/A ?Foley: yes and is still needed ?Code Status: Full Code  ? ?Critical care provider statement:  ? ?Total critical care time: 34 minutes ?  ?Performed by: Lanney Gins MD ?  ?Critical care time was exclusive of separately billable procedures and treating other patients. ?  ?Critical care was necessary to treat or prevent imminent or life-threatening deterioration. ?  ?Critical care was time spent personally by me on the following activities: development of treatment plan with patient and/or surrogate as well as nursing, discussions with consultants, evaluation of patient's response to treatment, examination of patient,  obtaining history from patient or surrogate, ordering  and performing treatments and interventions, ordering and review of laboratory studies, ordering and review of radiographic studies, pulse oximetry and re-evaluation of patient's condition. ?  ? ?Ronold Hardgrove A

## 2021-11-17 NOTE — Progress Notes (Signed)
[name] Daughter made aware of pt transfer to 1C. Rm 129. Receiving RN made aware. ?

## 2021-11-17 NOTE — Progress Notes (Signed)
GOALS OF CARE FAMILY CONFERENCE ? ? ?Current clinical status, hospital findings and medical plan was reviewed with family.  ? ?Updated and notified of patients ongoing immediate critical medical problems.  ? ?Patient remains high risk for intubation and has clear written advance directive for DNR/DNI ? ?Patient is unable to breathe independently, unable to protect airway and unable to mobilize secretions.   ? ?Explained to family course of therapy and the modalities  ? ?Patient with Progressive decline clinically with high probability of a very minimal chance of meaningful recovery despite aggressive and optimal medical therapy.  ? ?Family is appreciative of care and relate understanding that patient is severely critically ill with anticipation of passing away during this hospitalization.  ? ?They have consented and agreed to DNR/DNI  Code status  ? ?Family are satisfied with Plan of action and management. All questions answered ? ?Additional Critical Care time 35 mins ? ? ? ?Amber Fields, M.D.  ?Pulmonary & Critical Care Medicine  ?[place]  ? ?

## 2021-11-18 DIAGNOSIS — Z7189 Other specified counseling: Secondary | ICD-10-CM | POA: Diagnosis not present

## 2021-11-18 DIAGNOSIS — G610 Guillain-Barre syndrome: Secondary | ICD-10-CM | POA: Diagnosis not present

## 2021-11-18 MED ORDER — ACETAMINOPHEN 650 MG RE SUPP: 650.0000 mg | Freq: Four times a day (QID) | RECTAL | Status: DC | PRN

## 2021-11-18 MED ORDER — ACETAMINOPHEN 160 MG/5ML PO SOLN
650.0000 mg | ORAL | Status: DC | PRN
Start: 2021-11-18 — End: 2021-11-19
  Filled 2021-11-18: qty 20.3

## 2021-11-18 MED ORDER — ACETAMINOPHEN 325 MG PO TABS: 650.0000 mg | ORAL_TABLET | ORAL | Status: DC | PRN

## 2021-11-18 MED ORDER — GLYCOPYRROLATE 0.2 MG/ML IJ SOLN
0.4000 mg | INTRAMUSCULAR | Status: DC | PRN
  Administered 2021-11-18: 0.4 mg via INTRAVENOUS
  Filled 2021-11-18: qty 2

## 2021-11-19 LAB — MISC LABCORP TEST (SEND OUT): Labcorp test code: 9985

## 2021-12-06 NOTE — Progress Notes (Signed)
Pt lying in bed and smiled when name called. Pt lifted RUE and I asked pt to wiggle fingers and pt attempted to move digits multiple times. When asked if pt could wiggle left hand pt shook her head no and was unable to move extremity. Pt appears to be somewhat neurologically intact. ?

## 2021-12-06 NOTE — Progress Notes (Signed)
? ?NAME:  [name], MRN:  [id number], DOB:  [birth year], LOS: 14 ?ADMISSION DATE:  10/13/2021, CONSULTATION DATE:  11/05/2021 ?REFERRING MD:  Dr. Leslye Peer, CHIEF COMPLAINT:  Upper extremity weakness, dysarthria/dysphagia  ? ?Brief Pt Description / Synopsis:  ?88 y.o. 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  Transferred to [facility] 3/31 with PCCM consulted to follow respiratory status and ability to protect her airway ?Progressive rep failure and intubated 4/1 ? ?History of Present Illness/SYNOPSIS  ?88 y.o. female with a past medical history as listed below who presented to [facility] ED on 10/22/2021 due to complaints of abrupt onset of bilateral upper extremity weakness. ? ?She was in her normal state of health until 2:30 AM on Monday evening, where she noticed bilateral upper extremity weakness, along with dysarthria.  Both of these have progressively worsened. ? ?Of note she was seen at [facility] on 3/28 and had workup including MRI and MRA of the brain which showed no acute findings, thus was discharged home. ? ?She was admitted by the hospitalist with Neurology consultation.  Concern for cervical-pharyngeal-brachial variant of guillian-barre syndrome. ? ?On 3/31, she was noted to have increased spitting of oral secretions, and failed her swallow evaluation.  NIF was 14.  She was transferred to [facility] with PCCM consultation for close monitoring of respiratory status.  ? ?11/15/21- patient is for SBT. She is with palliative in conversation for [place].  I met with daughter and we had detailed conversation.  She is s/p extubation doing well thus far.  ? ?11/16/21- patient is working with PT/OT today she reports improvement but not able to swallow confidently without laryngeal penetration. Reducing rubinol and solumedrol todday. ? ?11/17/21- patient is with labored breathing today and is unable to clear secretions with reduction of glycopyrrolate and steroids.  She  was seen by neurology and seems to have poor prognosis based on clinical decline.  ? ?Nov 28, 2021- Patient is optimized for [facility] transfer , hospitalist team to pick up in am. Patient on comfort.  ? ?Pertinent  Medical History  ?Chronic kidney disease Stage 4 ?Diabetes mellitus ?Hyperparathyroidism ?Hypertension ?TIA ? ?Significant Test Results   ?MRI BRAIN 3/30>>revealed no acute intracranial process. No etiology is seen for the patient's dysarthria and weakness ?MRI CERVICAL SPINE 3/30>>C3-C4 moderate spinal canal stenosis with moderate right and mild left neural foraminal narrowing. C6-C7 moderate spinal canal stenosis with mild right neural foraminal narrowing. C5-C6 mild spinal canal stenosis and mild right neural foraminal narrowing. C2-C3 mild spinal canal stenosis, without neural foraminal narrowing. C4-C5 moderate to severe left neural foraminal narrowing. ? ?Micro Data:  ?3/30: SARS-CoV-2 & Influenza PCR>>negative ?3/31: MRSA PCR>>negative ?4/2: Tracheal aspirate>> normal respiratory flora ?4/6: Tracheal aspirate>>H flu ? ?Antimicrobials:  ?Aztreonam 4/6>>04/7 ?Vancomycin 4/6>>4/7 ?Levaquin 4/7>> ? ?Significant Hospital Events: ?Including procedures, antibiotic start and stop dates in addition to other pertinent events   ?3/29: Presented to ED in evening.  Admitted by Hospitalist.  Neurology consulted ?3/30: Seen by Neurology, concern for Guillain-barre syndrome given clinical presentation. ?3/31:  Pt with continued dysarthria/dysphagia.  Spitting out saliva, failed bedside swallow evaluation.  Transferred to Stepdown with PCCM consultation to closely follow respiratory status ?4/1 patient emergently intubated ?4/2 remains intubated and sedated ?4/4: Palliative Care consulted to assist with goals of care and whether to pursue tracheostomy ?4/5: Plan for diuresis.  Allowing family more time for decision regarding Tracheostomy. ?4/6: Start Heparin for new onset A.fib, check thyroid panel.  Repeating Tracheal  aspirate due to change in character or ETT  secretions and low grade fever overnight, start empiric Aztreonam and Vancomycin. ?4/7: Pt requiring minimal vent settings FiO2 30% and PEEP of 5 will plan for SBT today and diurese with 40 mg iv lasix x1 dose.  Family still discussing goals of treatment.  Discussing if they would want pt reintubated if she fails extubation, and proceed with tracheostomy placement.  Therefore, will hold off on extubation today to allow pts family time to decide ?4/8: plan for extubation today; will discuss reintubation vs DNI status with patient and family post-extubation ?4/9: reintubated, unable to manage secretions ? ? ? ?Objective   ?Blood pressure (!) 134/55, pulse 93, temperature 99.2 ?F (37.3 ?C), resp. rate 20, height 5' 7.99" (1.727 m), weight 103.5 kg, SpO2 (!) 81 %. ?   ?   ? ?Intake/Output Summary (Last 24 hours) at 11/27/2021 0909 ?Last data filed at Nov 27, 2021 0422 ?Gross per 24 hour  ?Intake 99.97 ml  ?Output 2775 ml  ?Net -2675.03 ml  ? ? ?Filed Weights  ? 11/15/21 0415 11/16/21 0436 11/17/21 0448  ?Weight: 103.7 kg 103.5 kg 103.5 kg  ? ? ?REVIEW OF SYSTEMS ?Unable to obtain, pt extubated ? ? ?PHYSICAL EXAMINATION: ?GENERAL: Elderly, Caucasian female in NAD ?HEENT: Supple, no JVD, +adentulous ?PULM: mild rhonchi rhonchi b/l ?CARDIO: tachycardic,  no murmur, rubs, or gallops  ?GASTRO: +BS x4, soft, obese, non distended  ?MUSC: moves all extremities but clearly weak throughout, no C/C/E  ?NEUROLOGIC: Awake, follows commands, PERRL ?SKIN: Intact no lesions or rashes present  ? ?Assessment & Plan:  ? ?Acute hypoxic, hypercapnic respiratory failure, due to aspiration pneumonitis  ?-s/p extubation now on low dose nasal canula ?- patient unable to protect airway but is on COMFORT measures ? ?Diagnosis of ACUTE atypical GBS  ?PMHx: TIA ?-Avoid sedating medications as able ?-Daily neuro stimulaion ?-Neuro following, appreciate input ?-Completed course o ?f IVIG (4 doses) as per  Neuro ? ?New onset Atrial Fibrillation, resolved ?PMHx: Hypertension ?-Echocardiogram 11/04/21: LVEF 31-54%, Grade I Diastolic dysfunction, RV systolic function normal, no significant valve abnormalities/stenosis/regurgitation ?-Continuous cardiac monitoring ?-Heparin drip for anticoagulation (CHA2DS2-VASc 2 score 7, HAS-BLED score 4) ?-Consult for PICC ? ?H flu HAP ?-Continue Levaquin x7d ?-Monitor fever curve ?-Trend WBC's ?-Follow culture sensivities ? ?CKD Stage IV ?-Monitor I&O's / urinary output ?-Follow BMP ?-Ensure adequate renal perfusion ?-Avoid nephrotoxic agents as able ?-Replace electrolytes as indicated ? ?Diabetes Mellitus ?-CBG's q4h; Target range of 140 to 180 ?-SSI and scheduled novolog 8 units q4hrs  ?-Follow ICU Hypo/Hyperglycemia protocol ?-Diabetes coordinator consulted appreciate input  ? ?Gout, resolved ?-Stop colchicine in setting of AKI on CKD ?-Will closely monitor ? ?Best Practice (right click and "Reselect all SmartList Selections" daily)  ?Diet/type: TF ?DVT prophylaxis: Heparin gtt  ?GI prophylaxis: Pepcid  ?Lines: N/A ?Foley: yes and is still needed ?Code Status: Full Code  ? ?  ? ?Ottie Glazier, M.D.  ?Pulmonary & Critical Care Medicine  ? ? ? ? ?

## 2021-12-06 NOTE — Progress Notes (Signed)
?   12/08/2021 1000  ?Clinical Encounter Type  ?Visited With Patient and family together  ?Visit Type Follow-up;Patient actively dying  ?Spiritual Encounters  ?Spiritual Needs Prayer;Grief support  ? ?Chaplain joined palliative care team to provide support for family as their loved one is placed on comfort care. ?

## 2021-12-06 NOTE — Death Summary Note (Addendum)
? ?  NAME:  Amber Fields, MRN:  [id number], DOB:  18-Dec-1932, LOS: 15 ?ADMISSION DATE:  10/20/2021, CONSULTATION DATE:  11/05/2021 ?REFERRING MD:  Dr. Leslye Peer, CHIEF COMPLAINT:  Upper extremity weakness, dysarthria/dysphagia  ?PATIENT PASSED AWAY ON COMFORT MEASURES WITH FAMILY AT BEDSIDE.  ?PATIENT PASSED AWAY AT 2238/10/24 [birth year] - FROM COMPLICATIONS RELATED TO Guillain-Barr? syndrome with multiple comorbid conditions and advanced age.  May she rest in peace.  ? ?Pertinent  Medical History  ?Chronic kidney disease Stage 4 ?Diabetes mellitus ?Hyperparathyroidism ?Hypertension ?TIA ? ?Significant Test Results   ?MRI BRAIN 3/30>>revealed no acute intracranial process. No etiology is seen for the patient's dysarthria and weakness ?MRI CERVICAL SPINE 3/30>>C3-C4 moderate spinal canal stenosis with moderate right and mild left neural foraminal narrowing. C6-C7 moderate spinal canal stenosis with mild right neural foraminal narrowing. C5-C6 mild spinal canal stenosis and mild right neural foraminal narrowing. C2-C3 mild spinal canal stenosis, without neural foraminal narrowing. C4-C5 moderate to severe left neural foraminal narrowing. ? ?Micro Data:  ?3/30: SARS-CoV-2 & Influenza PCR>>negative ?3/31: MRSA PCR>>negative ?4/2: Tracheal aspirate>> normal respiratory flora ?4/6: Tracheal aspirate>>H flu ? ?Antimicrobials:  ?Aztreonam 4/6>>04/7 ?Vancomycin 4/6>>4/7 ?Levaquin 4/7>> ? ? ?Objective   ?Blood pressure (!) 145/76, pulse 80, temperature 98.7 ?F (37.1 ?C), resp. rate 20, height 5' 7.99" (1.727 m), weight 103.5 kg, SpO2 91 %. ?   ?   ?No intake or output data in the 24 hours ending 11/19/21 1407 ? ?Filed Weights  ? 11/15/21 0415 11/16/21 0436 11/17/21 0448  ?Weight: 103.7 kg 103.5 kg 103.5 kg  ? ? ?PATIENT IS WITH ABSENCE OF VITAL SIGNS ?  ? ?Ottie Glazier, M.D.  ?Pulmonary & Critical Care Medicine  ? ? ? ? ?

## 2021-12-06 NOTE — Care Management Important Message (Signed)
Important Message ? ?Patient Details  ?Name: Amber Fields ?MRN: [id number] ?Date of Birth: Feb 12, 1933 ? ? ?Medicare Important Message Given:  Other (see comment) ? ?Patient placed on comfort care and out of respect for the patient and family no Important Message from [facility] given. ? ? ?Juliann Pulse A Cortez Flippen ?2021-12-10, 1:50 PM ?

## 2021-12-06 NOTE — Progress Notes (Signed)
Zyair passed comfortably at 2240. 2 RN assessment completed with charge RN. Juliann Pulse, daughter-in-law, and Probation officer at bedside. Juliann Pulse notified Srihitha's son and daughter who arrived at bedside.  ? ?Dentures left in Laneah's mouth. All other belongings taken with family.  ?

## 2021-12-06 NOTE — Progress Notes (Signed)
? ?                                                                                                                                                     ?                                                   ?Daily Progress Note  ? ?Patient Name: Amber Fields       Date: 11/30/21 ?DOB: January 08, 1933  Age: 88 y.o. MRN#: [id number] ?Attending Physician: Ottie Glazier, MD ?Primary Care Physician: [facility] ?Admit Date: 10/25/2021 ? ?Reason for Consultation/Follow-up: Establishing goals of care and Terminal Care ? ?Subjective: ?Notes reviewed. Spoke with neurology. In to see patient. No distress noted. Chaplain is at bedside. Patient is resting in bed with eyes closed. Daughter and son are at bedside. Questions answered. Discussed in great detail the interaction I had with Baldo Ash prior to extubation, and the conversation I was able to have with [facility] following extubation both in the presence of her daughter Amber Fields. Son discusses hope he holds God will fix her. He discusses QOL he believes she would be willing to accept. Discussed her statements on reintubation and tracheostomy. Discussed her faith and God's sovereignty. Son mentions concern for providing "a shot" that would take her out of this world. Discussed that no medication or care is provided that takes her out of this world; only as needed medication and care  is provided to keep her comfortable in the process of her natural transition process out of this world. She opened eyes briefly and smiled but did not speak. Son states he desires his mother's passing to be comfortable, daughter agrees. Chaplain prayed and Chaplain and I exited.  ? ?Length of Stay: 14 ? ?Current Medications: ?Scheduled Meds:  ? ? ?Continuous Infusions: ? sodium chloride Stopped (11/17/21 2200)  ? ? ?PRN Meds: ?acetaminophen **OR** acetaminophen (TYLENOL) oral liquid 160 mg/5 mL **OR** acetaminophen, glycopyrrolate, ipratropium-albuterol, lip balm, LORazepam, morphine  injection ? ?Physical Exam ?Constitutional:   ?   Comments: Eyes closed.  ?Pulmonary:  ?   Effort: Pulmonary effort is normal.  ?         ? ?Vital Signs: BP (!) 134/55 (BP Location: Left Arm)   Pulse 93   Temp 99.2 ?F (37.3 ?C)   Resp 20   Ht 5' 7.99" (1.727 m)   Wt 103.5 kg   SpO2 (!) 81%   BMI 34.70 kg/m?  ?SpO2: SpO2: (!) 81 % ?O2 Device: O2 Device: Room Air ?O2 Flow Rate: O2 Flow Rate (L/min): 2 L/min ? ?Intake/output summary:  ?Intake/Output Summary (Last 24 hours) at Nov 30, 2021 1040 ?Last data filed at 11-30-2021  0422 ?Gross per 24 hour  ?Intake 99.97 ml  ?Output 2775 ml  ?Net -2675.03 ml  ? ?LBM: Last BM Date : 11/12/21 ?Baseline Weight: Weight: 99.8 kg ?Most recent weight: Weight: 103.5 kg ? ? ? ?Patient Active Problem List  ? Diagnosis Date Noted  ? 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 Weakness 11/04/2021  ? Type 2 diabetes mellitus with hyperlipidemia ([place]) 11/04/2021  ? Obesity (BMI 30-39.9) 11/04/2021  ? Subacute neurologic deficit 11/04/2021  ? CKD (chronic kidney disease) stage 4, GFR 15-29 ml/min ([facility])   ? History of TIA  08/2019(transient ischemic attack)   ? Fall at home, initial encounter 02/10/2021  ? ? ?Palliative Care Assessment & Plan  ? ? ?Recommendations/Plan: ? ?Patient currently on comfort care.  ? ?Would recommend having a low threshold for providing the PRN [place].  ? ? ? ? ?Code Status: ? ?  ?Code Status Orders  ?(From admission, onward)  ?  ? ? ?  ? ?  Start     Ordered  ? 11/17/21 1625  Do not attempt resuscitation (DNR)  Continuous       ?Question Answer Comment  ?In the event of cardiac or respiratory ARREST Do not call a ?code blue?   ?In the event of cardiac or respiratory ARREST Do not perform Intubation, CPR, defibrillation or ACLS   ?In the event of cardiac or respiratory ARREST Use medication by any route, position, wound care, and other measures to relive pain and suffering. May use oxygen, suction and manual treatment of airway obstruction as needed for  comfort.   ?Comments per patient and family   ?  ? 11/17/21 1625  ? ?  ?  ? ?  ? ?Code Status History   ? ? Date Active Date Inactive Code Status Order ID Comments User Context  ? 11/17/2021 1519 11/17/2021 1625 DNR [id number]  Ottie Glazier, MD Inpatient  ? 11/01/2021 1952 11/17/2021 1519 Full Code [id number]  Athena Masse, MD ED  ? 02/10/2021 1307 02/12/2021 2225 Full Code [id number]  Collier Bullock, MD ED  ? ?  ? ? ?Prognosis: ? Hours - Days ? ? ? ?Thank you for allowing the Palliative Medicine Team to assist in the care of this patient. ? ? ?Asencion Gowda, NP ? ?Please contact Palliative Medicine Team phone at [phone] for questions and concerns.  ? ? ? ? ? ?

## 2021-12-06 NOTE — Progress Notes (Signed)
Nutrition Brief Note  Chart reviewed. Pt now transitioning to comfort care.  No further nutrition interventions planned at this time.  Please re-consult as needed.   Liliane Mallis W, RD, LDN, CDCES Registered Dietitian II Certified Diabetes Care and Education Specialist Please refer to [facility] for RD and/or RD on-call/weekend/after hours pager   

## 2021-12-06 DEATH — deceased
